# Patient Record
Sex: Female | Born: 1985 | Race: White | Hispanic: No | Marital: Married | Smoking: Never smoker
Health system: Southern US, Community
[De-identification: ages and names within clinical notes are randomized; demographics above are authoritative.]

## PROBLEM LIST (undated history)

## (undated) ENCOUNTER — Ambulatory Visit: Admission: EM | Source: Home / Self Care

## (undated) DIAGNOSIS — K219 Gastro-esophageal reflux disease without esophagitis: Secondary | ICD-10-CM

## (undated) DIAGNOSIS — N939 Abnormal uterine and vaginal bleeding, unspecified: Secondary | ICD-10-CM

## (undated) DIAGNOSIS — F419 Anxiety disorder, unspecified: Secondary | ICD-10-CM

## (undated) DIAGNOSIS — R011 Cardiac murmur, unspecified: Secondary | ICD-10-CM

## (undated) DIAGNOSIS — D649 Anemia, unspecified: Secondary | ICD-10-CM

## (undated) DIAGNOSIS — R7303 Prediabetes: Secondary | ICD-10-CM

## (undated) DIAGNOSIS — J189 Pneumonia, unspecified organism: Secondary | ICD-10-CM

## (undated) DIAGNOSIS — R0600 Dyspnea, unspecified: Secondary | ICD-10-CM

## (undated) DIAGNOSIS — M1990 Unspecified osteoarthritis, unspecified site: Secondary | ICD-10-CM

## (undated) DIAGNOSIS — R569 Unspecified convulsions: Secondary | ICD-10-CM

## (undated) DIAGNOSIS — T7840XA Allergy, unspecified, initial encounter: Secondary | ICD-10-CM

## (undated) DIAGNOSIS — F32A Depression, unspecified: Secondary | ICD-10-CM

## (undated) DIAGNOSIS — F4310 Post-traumatic stress disorder, unspecified: Secondary | ICD-10-CM

## (undated) DIAGNOSIS — G44009 Cluster headache syndrome, unspecified, not intractable: Secondary | ICD-10-CM

## (undated) DIAGNOSIS — J45909 Unspecified asthma, uncomplicated: Secondary | ICD-10-CM

## (undated) DIAGNOSIS — G510 Bell's palsy: Secondary | ICD-10-CM

## (undated) DIAGNOSIS — C801 Malignant (primary) neoplasm, unspecified: Secondary | ICD-10-CM

## (undated) HISTORY — DX: Depression, unspecified: F32.A

## (undated) HISTORY — DX: Cardiac murmur, unspecified: R01.1

## (undated) HISTORY — DX: Unspecified osteoarthritis, unspecified site: M19.90

## (undated) HISTORY — DX: Malignant (primary) neoplasm, unspecified: C80.1

## (undated) HISTORY — DX: Allergy, unspecified, initial encounter: T78.40XA

## (undated) HISTORY — PX: TUBAL LIGATION: SHX77

## (undated) HISTORY — PX: OTHER SURGICAL HISTORY: SHX169

---
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
## 2006-01-20 ENCOUNTER — Emergency Department: Payer: Self-pay | Admitting: Emergency Medicine

## 2007-05-15 ENCOUNTER — Ambulatory Visit: Payer: Self-pay | Admitting: Family Medicine

## 2007-05-16 ENCOUNTER — Emergency Department: Payer: Self-pay | Admitting: Unknown Physician Specialty

## 2007-07-15 ENCOUNTER — Emergency Department: Payer: Self-pay | Admitting: Emergency Medicine

## 2007-07-26 ENCOUNTER — Emergency Department: Payer: Self-pay | Admitting: Emergency Medicine

## 2007-09-10 ENCOUNTER — Other Ambulatory Visit: Payer: Self-pay

## 2007-09-10 ENCOUNTER — Emergency Department: Payer: Self-pay | Admitting: Emergency Medicine

## 2007-10-04 ENCOUNTER — Emergency Department: Payer: Self-pay | Admitting: Emergency Medicine

## 2007-12-29 ENCOUNTER — Emergency Department: Payer: Self-pay | Admitting: Emergency Medicine

## 2008-03-17 ENCOUNTER — Observation Stay: Payer: Self-pay | Admitting: Obstetrics and Gynecology

## 2008-04-07 ENCOUNTER — Observation Stay: Payer: Self-pay

## 2008-05-21 ENCOUNTER — Observation Stay: Payer: Self-pay

## 2008-05-26 ENCOUNTER — Observation Stay: Payer: Self-pay

## 2008-06-01 ENCOUNTER — Observation Stay: Payer: Self-pay

## 2008-06-15 ENCOUNTER — Observation Stay: Payer: Self-pay

## 2008-06-21 ENCOUNTER — Inpatient Hospital Stay: Payer: Self-pay

## 2009-04-01 IMAGING — CT CT HEAD WITHOUT CONTRAST
2 series · 16 of 30 positions shown, 20 images · non-contrast
Comparison: none

REASON FOR EXAM: Head Trauma + LOC
COMMENTS:   LMP: > one month ago

[Series 2: without · axial · non-contrast · 0.39mm/px · z∈[+762,+887]mm · 13 of 31 slices shown, 17 images]
[im 3/31  brain]
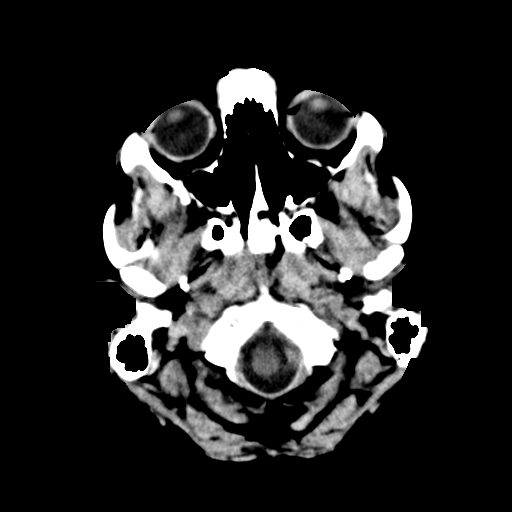
[im 3/31  bone]
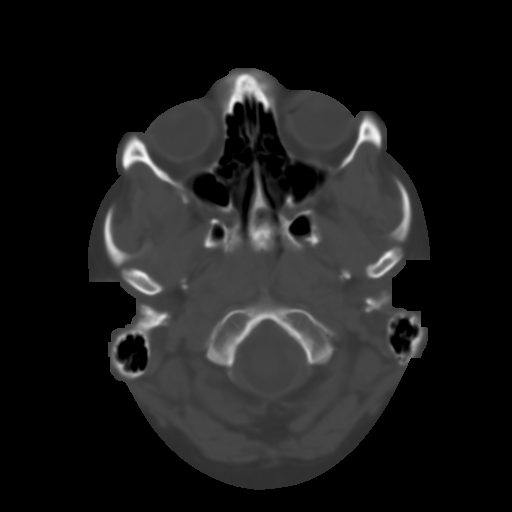
[im 5/31  brain]
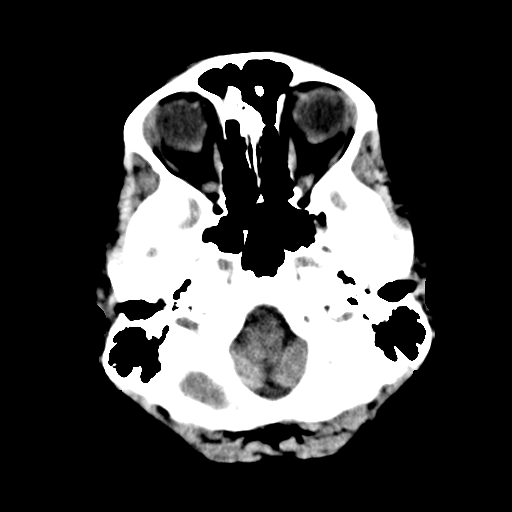
[im 7/31  brain]
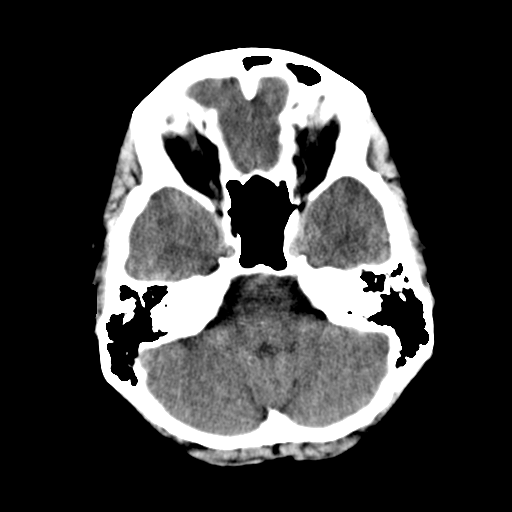
[im 9/31  brain]
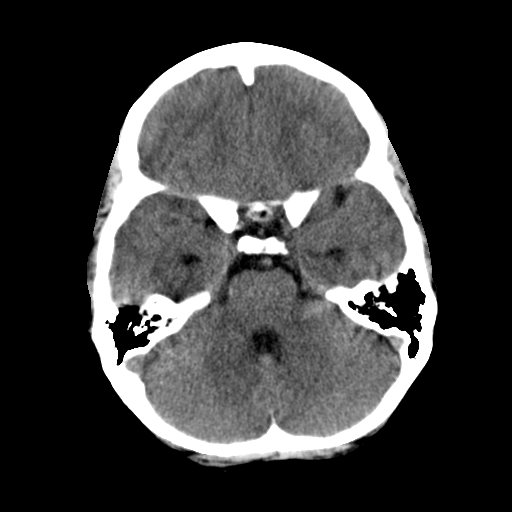
[im 11/31  brain]
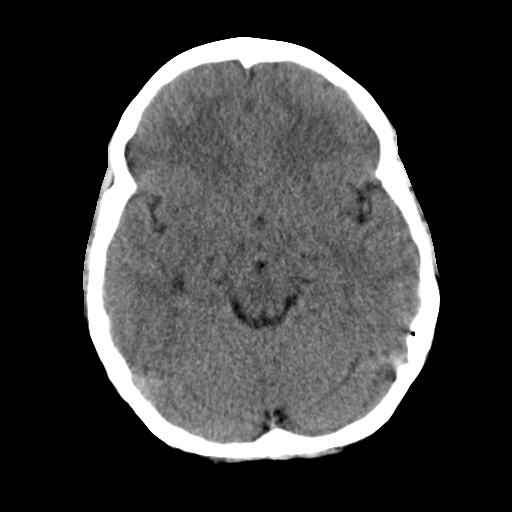
[im 11/31  bone]
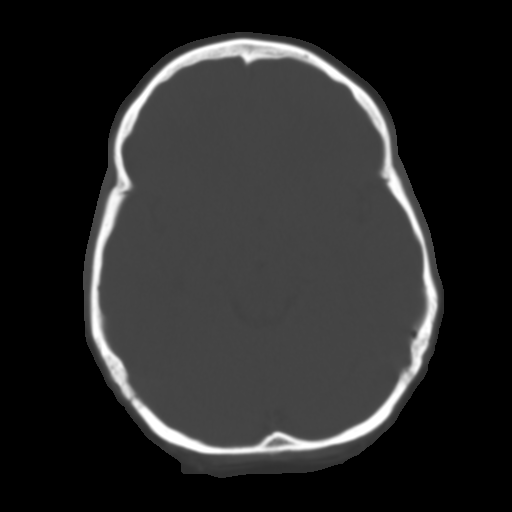
[im 13/31  brain]
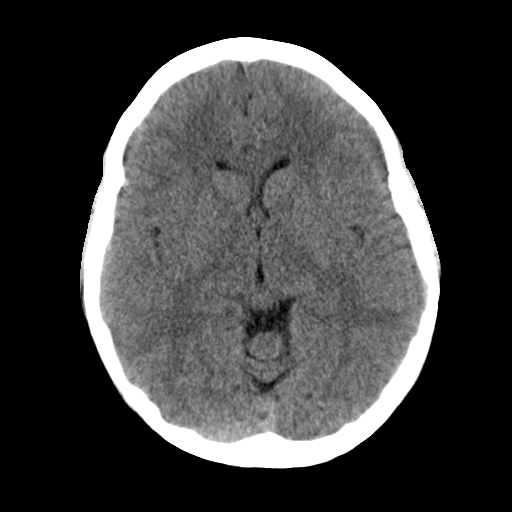
[im 16/31  brain]
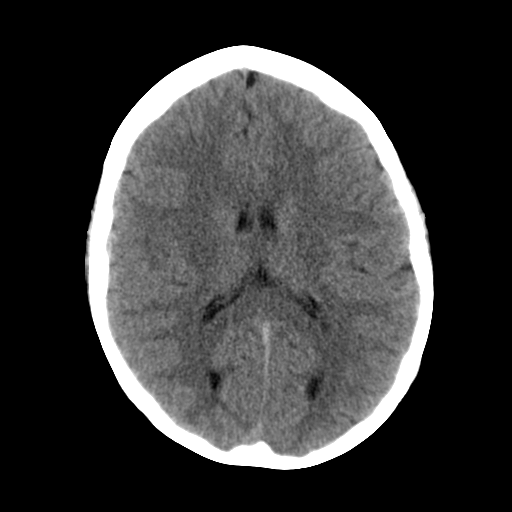
[im 18/31  brain]
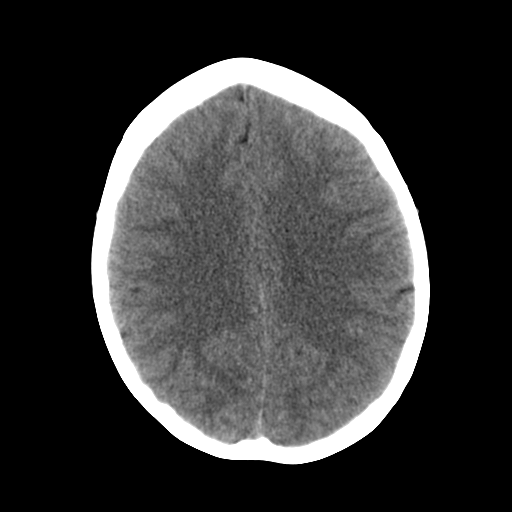
[im 20/31  brain]
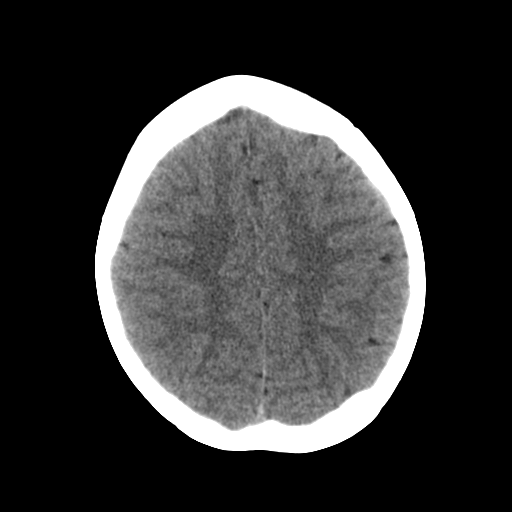
[im 20/31  bone]
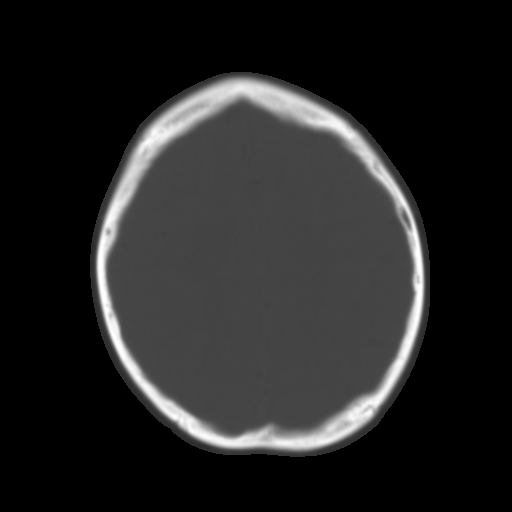
[im 22/31  brain]
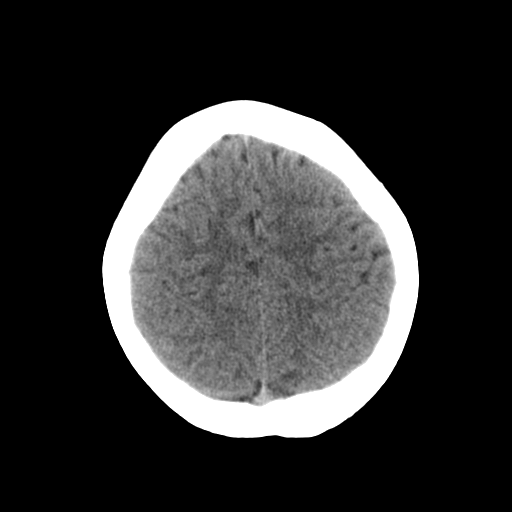
[im 24/31  brain]
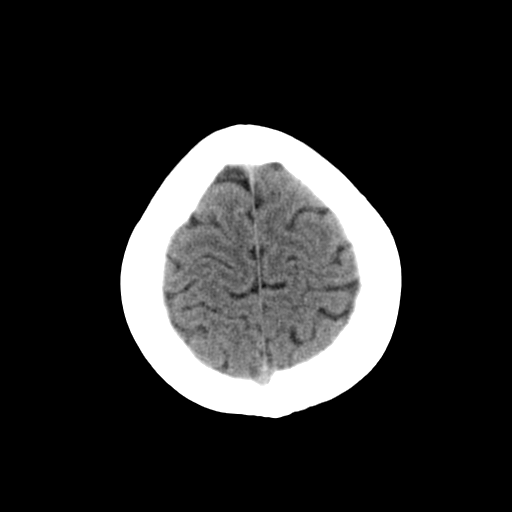
[im 26/31  brain]
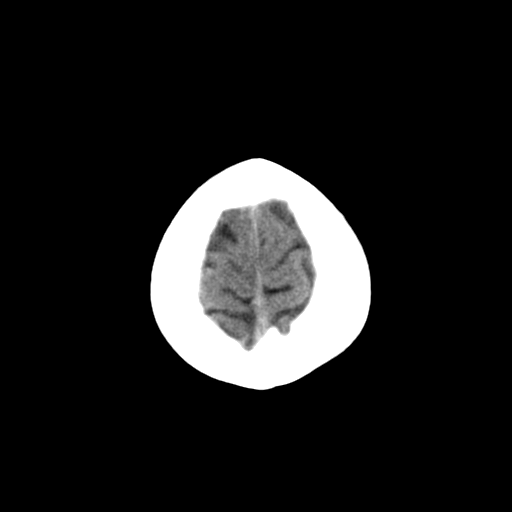
[im 28/31  brain]
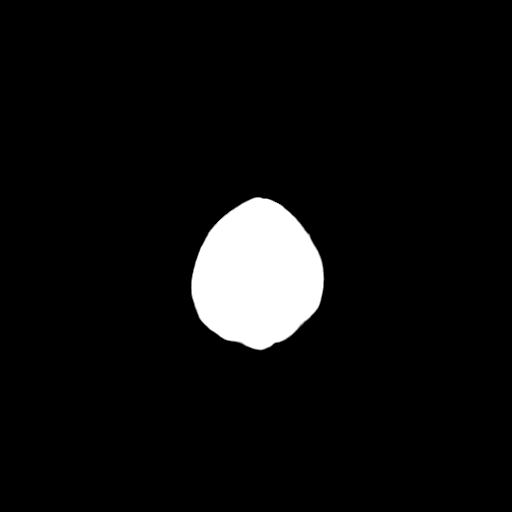
[im 28/31  bone]
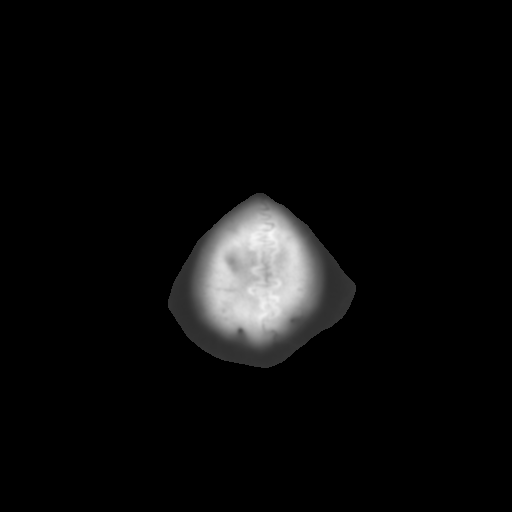

[Series 3: bone · axial · 0.39mm/px · z∈[+762,+802]mm · 3 of 31 slices shown]
[im 3/31  bone]
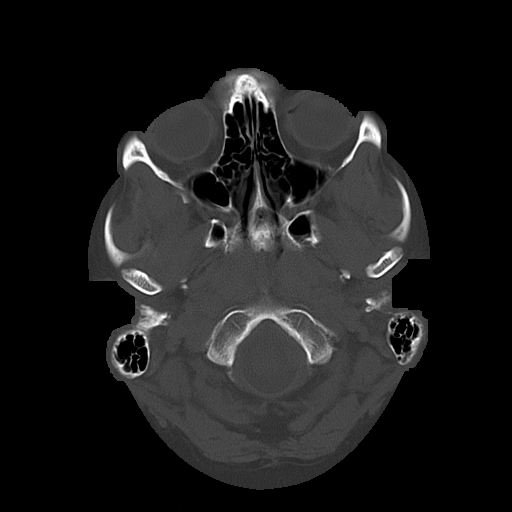
[im 7/31  bone]
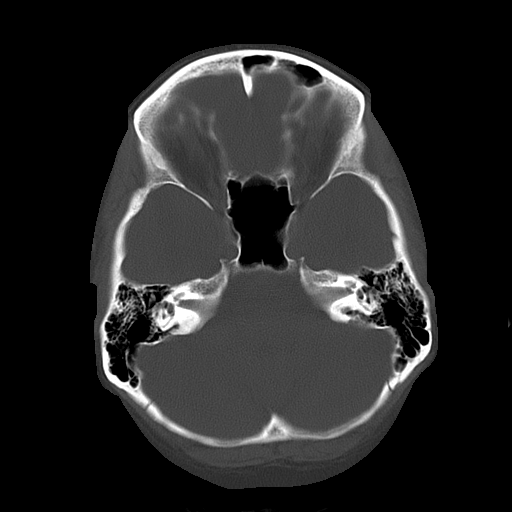
[im 11/31  bone]
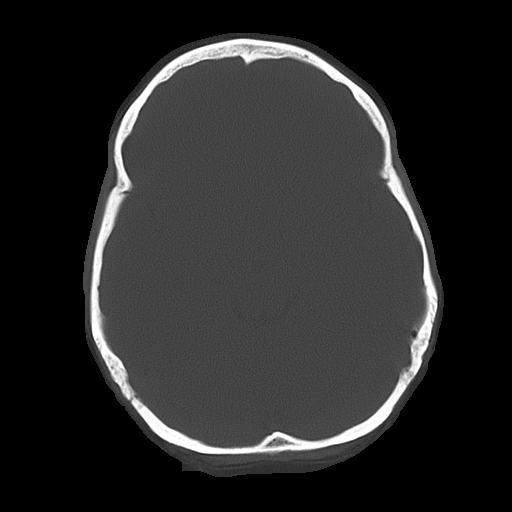

[16 of 30 positions shown; findings below may reference images not displayed]

PROCEDURE:     CT  - CT HEAD WITHOUT CONTRAST  - July 15, 2007  [DATE]

RESULT:     The ventricles are normal in size and position. There is no
intracranial hemorrhage, mass, or mass-effect. The cerebellum and brainstem
are normal in density. At bone window settings, I do not see evidence of an
acute skull fracture. The observed portions of the paranasal sinuses are
clear. The mastoid air cells are well pneumatized.
IMPRESSION: I do not see evidence of acute intracranial abnormality.

This report was called to the [HOSPITAL] the conclusion of the
study.

## 2009-04-01 IMAGING — CR DG CHEST 2V
1 series · 2 of 2 positions shown · non-contrast
Comparison: none

REASON FOR EXAM: Chest Trauma
COMMENTS:   LMP: > one month ago

[Series 1: view not recorded · 0.17mm/px · 2 of 2 slices shown]
[im 1/2]
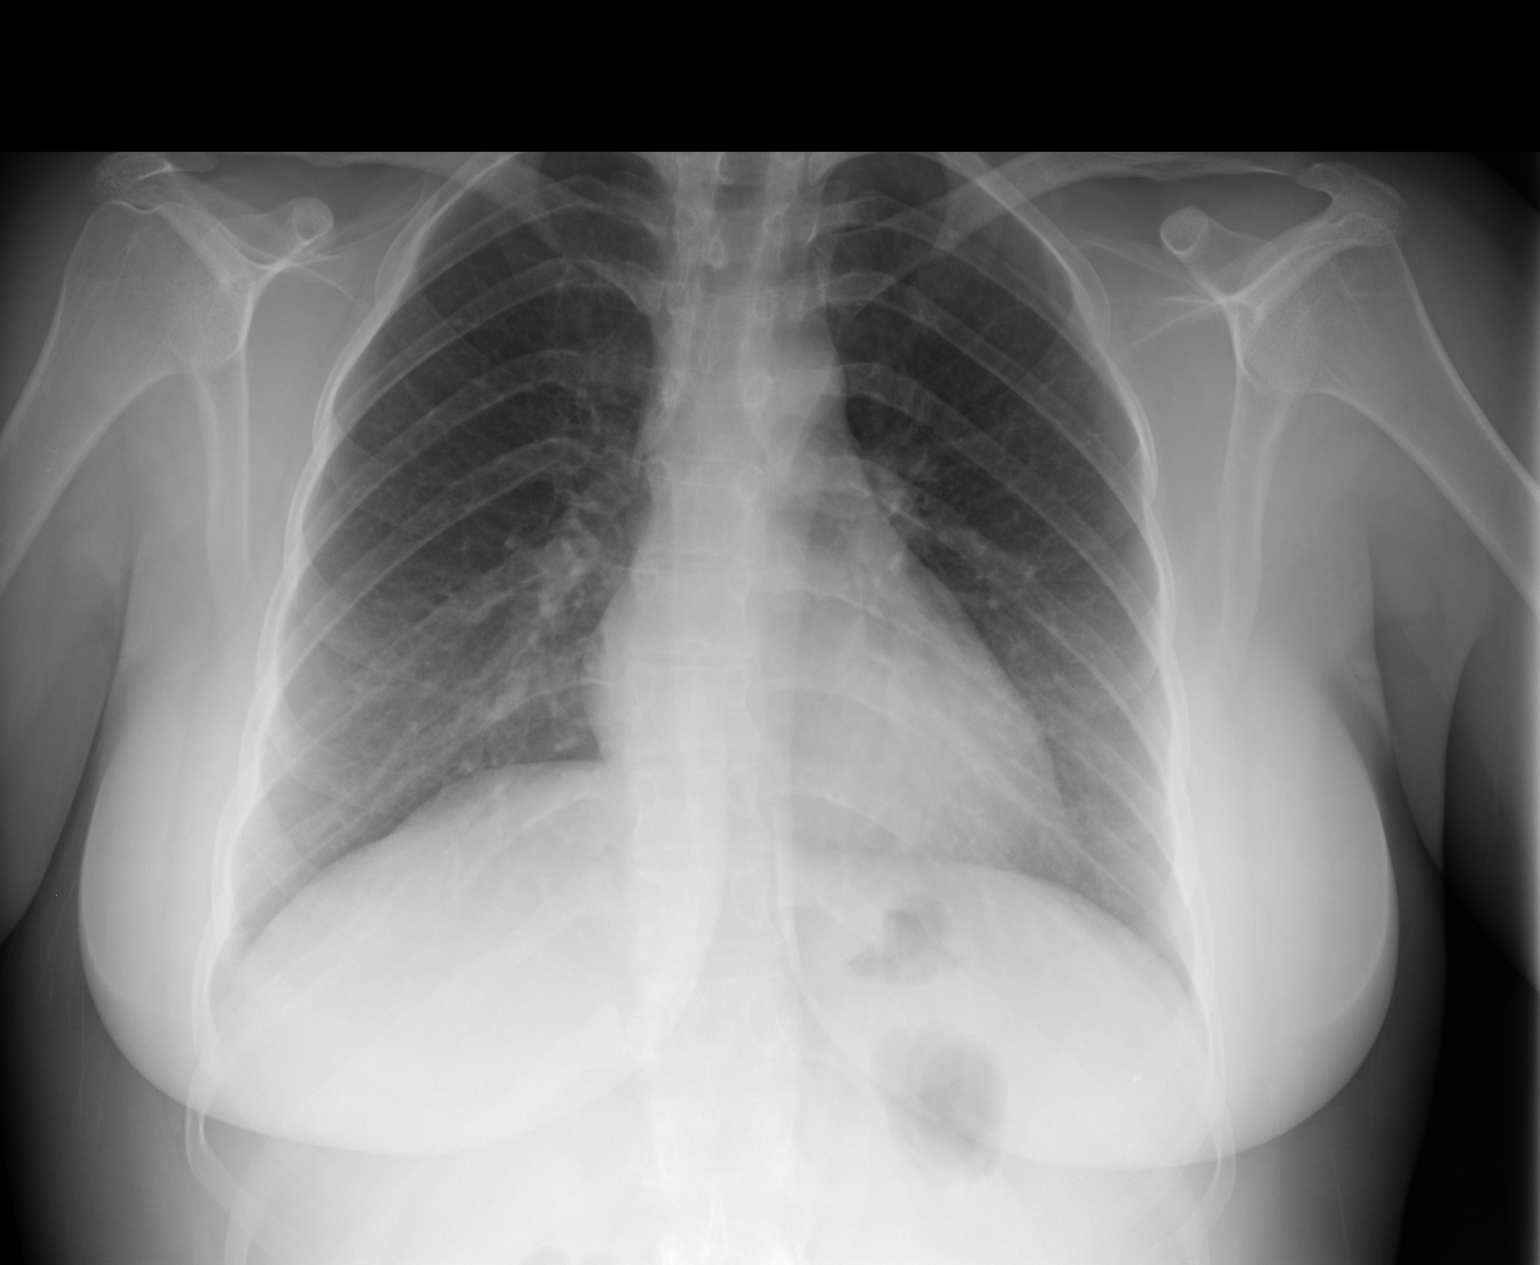
[im 2/2]
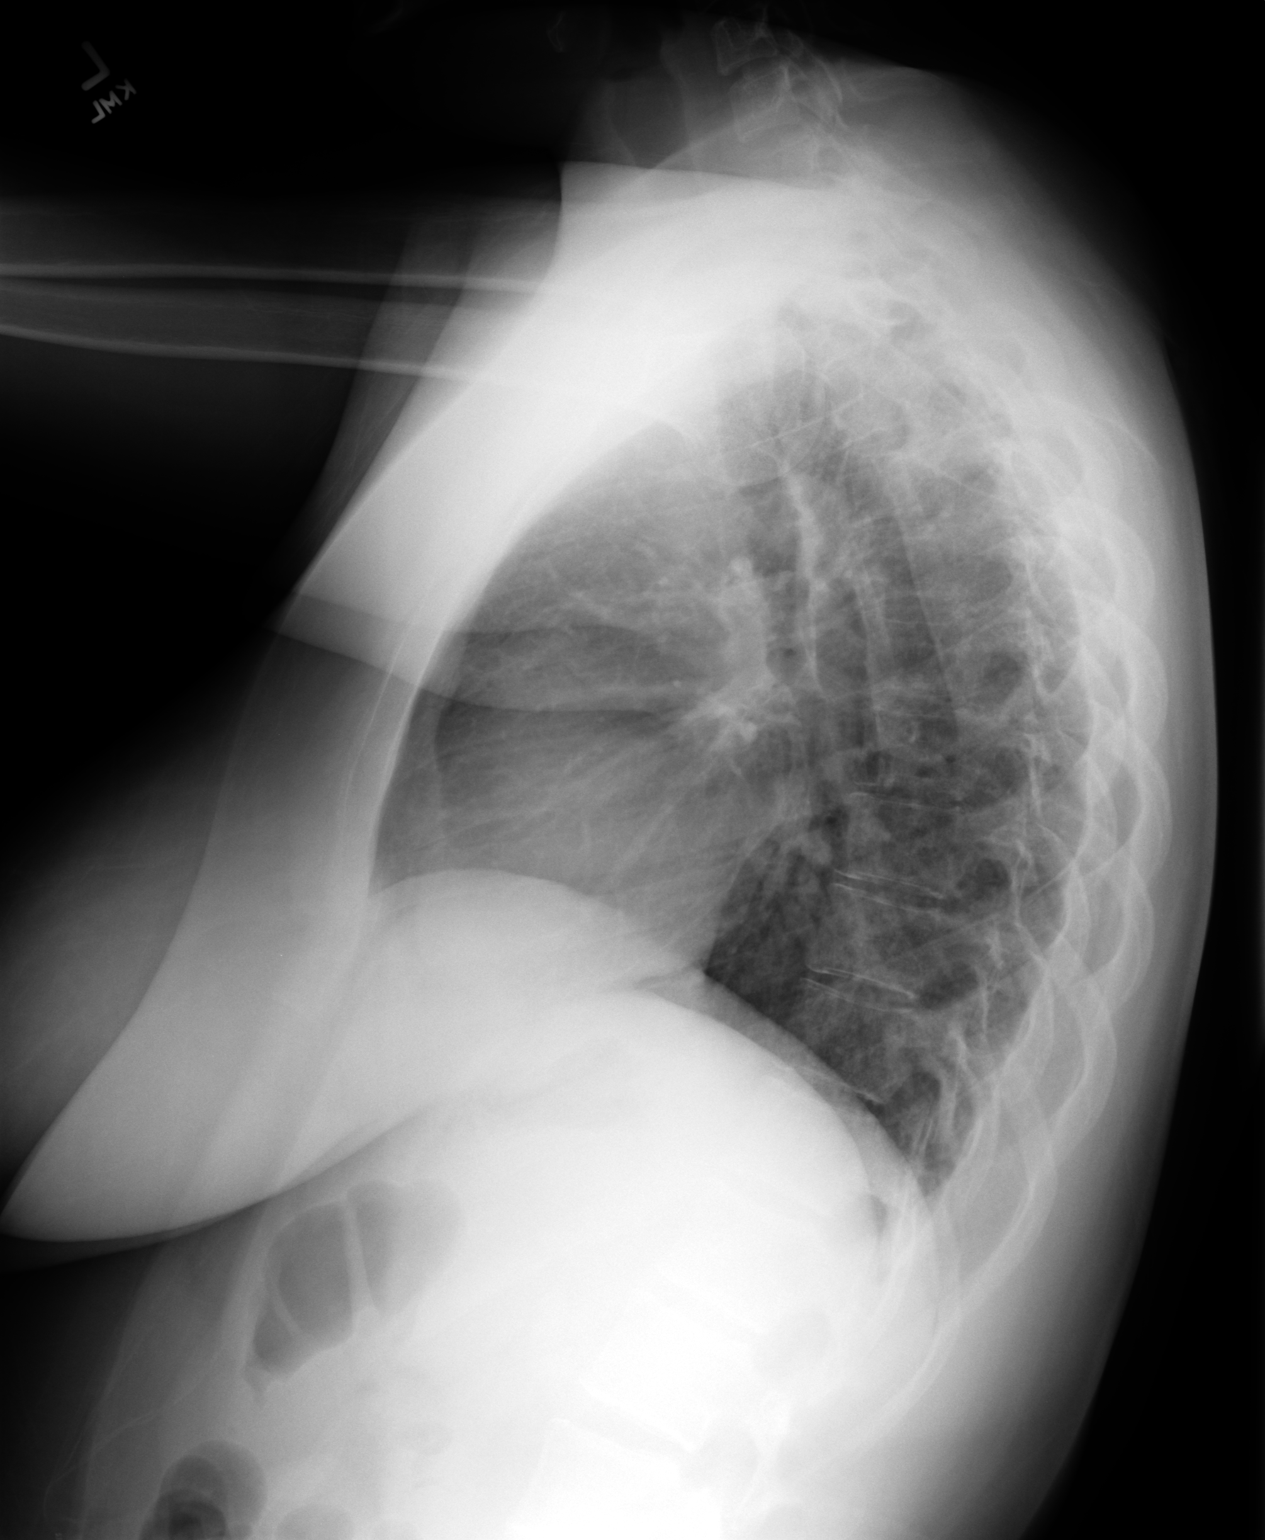

[2 of 2 positions shown; findings below may reference images not displayed]

PROCEDURE:     DXR - DXR CHEST PA (OR AP) AND LATERAL  - July 15, 2007  [DATE]

RESULT:     There is no prior exam for comparison.

The lungs are clear. The heart and pulmonary vessels are normal. The bony
and mediastinal structures are unremarkable. There is no effusion. There is
no pneumothorax or evidence of congestive failure.
IMPRESSION: No acute cardiopulmonary disease.

## 2009-07-06 ENCOUNTER — Ambulatory Visit: Payer: Self-pay | Admitting: Obstetrics and Gynecology

## 2009-07-08 ENCOUNTER — Ambulatory Visit: Payer: Self-pay | Admitting: Obstetrics and Gynecology

## 2009-07-20 ENCOUNTER — Emergency Department: Payer: Self-pay | Admitting: Emergency Medicine

## 2011-03-08 ENCOUNTER — Other Ambulatory Visit: Payer: Self-pay

## 2011-10-19 ENCOUNTER — Emergency Department: Payer: Self-pay | Admitting: *Deleted

## 2011-10-19 LAB — CBC
MCH: 30.5 pg (ref 26.0–34.0)
RBC: 4.56 10*6/uL (ref 3.80–5.20)
RDW: 13.1 % (ref 11.5–14.5)
WBC: 4.4 10*3/uL (ref 3.6–11.0)

## 2011-10-19 LAB — HCG, QUANTITATIVE, PREGNANCY: Beta Hcg, Quant.: <1 m[IU]/mL — ABNORMAL LOW

## 2011-12-24 ENCOUNTER — Emergency Department: Payer: Self-pay | Admitting: Emergency Medicine

## 2011-12-24 LAB — URINALYSIS, COMPLETE
Bacteria: NONE SEEN
Bilirubin,UR: NEGATIVE
Blood: NEGATIVE
Glucose,UR: NEGATIVE mg/dL (ref 0–75)
Ketone: NEGATIVE
Leukocyte Esterase: NEGATIVE
Nitrite: NEGATIVE
Ph: 7 (ref 4.5–8.0)
Protein: NEGATIVE
RBC,UR: <1 /HPF (ref 0–5)
Specific Gravity: 1.023 (ref 1.003–1.030)
Squamous Epithelial: 7
WBC UR: 1 /HPF (ref 0–5)

## 2011-12-24 LAB — PREGNANCY, URINE: Pregnancy Test, Urine: NEGATIVE m[IU]/mL

## 2011-12-25 LAB — BASIC METABOLIC PANEL
Anion Gap: 7 (ref 7–16)
BUN: 12 mg/dL (ref 7–18)
Calcium, Total: 9.3 mg/dL (ref 8.5–10.1)
Chloride: 107 mmol/L (ref 98–107)
EGFR (African American): >60
EGFR (Non-African Amer.): >60
Glucose: 79 mg/dL (ref 65–99)
Osmolality: 282 (ref 275–301)
Sodium: 142 mmol/L (ref 136–145)

## 2011-12-25 LAB — CBC
HCT: 41.8 % (ref 35.0–47.0)
MCHC: 33.9 g/dL (ref 32.0–36.0)
MCV: 91 fL (ref 80–100)
Platelet: 170 10*3/uL (ref 150–440)
RBC: 4.59 10*6/uL (ref 3.80–5.20)
WBC: 8.5 10*3/uL (ref 3.6–11.0)

## 2011-12-25 LAB — WET PREP, GENITAL

## 2012-01-28 ENCOUNTER — Emergency Department: Payer: Self-pay | Admitting: Emergency Medicine

## 2012-03-31 ENCOUNTER — Emergency Department: Payer: Self-pay

## 2012-03-31 LAB — CBC
HGB: 14.3 g/dL (ref 12.0–16.0)
MCH: 30.9 pg (ref 26.0–34.0)
MCHC: 34.4 g/dL (ref 32.0–36.0)
MCV: 90 fL (ref 80–100)
Platelet: 189 10*3/uL (ref 150–440)
RDW: 12.7 % (ref 11.5–14.5)
WBC: 7.7 10*3/uL (ref 3.6–11.0)

## 2012-03-31 LAB — COMPREHENSIVE METABOLIC PANEL
Albumin: 3.8 g/dL (ref 3.4–5.0)
Anion Gap: 9 (ref 7–16)
BUN: 12 mg/dL (ref 7–18)
Bilirubin,Total: 0.7 mg/dL (ref 0.2–1.0)
Chloride: 105 mmol/L (ref 98–107)
Creatinine: 0.68 mg/dL (ref 0.60–1.30)
Glucose: 93 mg/dL (ref 65–99)
Potassium: 3.8 mmol/L (ref 3.5–5.1)
SGOT(AST): 20 U/L (ref 15–37)
SGPT (ALT): 23 U/L (ref 12–78)
Total Protein: 7.9 g/dL (ref 6.4–8.2)

## 2012-03-31 LAB — URINALYSIS, COMPLETE
Bilirubin,UR: NEGATIVE
Blood: NEGATIVE
Glucose,UR: NEGATIVE mg/dL (ref 0–75)
Leukocyte Esterase: NEGATIVE
Protein: NEGATIVE
RBC,UR: 1 /HPF (ref 0–5)
Squamous Epithelial: 7
WBC UR: 1 /HPF (ref 0–5)

## 2012-03-31 LAB — PREGNANCY, URINE: Pregnancy Test, Urine: NEGATIVE m[IU]/mL

## 2012-09-14 ENCOUNTER — Emergency Department: Payer: Self-pay | Admitting: Unknown Physician Specialty

## 2013-03-14 ENCOUNTER — Emergency Department: Payer: Self-pay | Admitting: Emergency Medicine

## 2013-10-20 ENCOUNTER — Emergency Department: Payer: Self-pay | Admitting: Emergency Medicine

## 2013-10-20 LAB — COMPREHENSIVE METABOLIC PANEL
ALBUMIN: 4 g/dL (ref 3.4–5.0)
ALK PHOS: 81 U/L
AST: 20 U/L (ref 15–37)
Anion Gap: 8 (ref 7–16)
BUN: 12 mg/dL (ref 7–18)
Bilirubin,Total: 1.1 mg/dL — ABNORMAL HIGH (ref 0.2–1.0)
CHLORIDE: 109 mmol/L — AB (ref 98–107)
CO2: 20 mmol/L — AB (ref 21–32)
Calcium, Total: 9.5 mg/dL (ref 8.5–10.1)
Creatinine: 0.61 mg/dL (ref 0.60–1.30)
Glucose: 107 mg/dL — ABNORMAL HIGH (ref 65–99)
Osmolality: 274 (ref 275–301)
Potassium: 3.4 mmol/L — ABNORMAL LOW (ref 3.5–5.1)
SGPT (ALT): 25 U/L (ref 12–78)
SODIUM: 137 mmol/L (ref 136–145)
TOTAL PROTEIN: 8.4 g/dL — AB (ref 6.4–8.2)

## 2013-10-20 LAB — CBC WITH DIFFERENTIAL/PLATELET
Basophil #: 0.1 10*3/uL (ref 0.0–0.1)
Basophil %: 0.8 %
EOS PCT: 0.4 %
Eosinophil #: 0 10*3/uL (ref 0.0–0.7)
HCT: 45.1 % (ref 35.0–47.0)
HGB: 15.1 g/dL (ref 12.0–16.0)
LYMPHS PCT: 28.3 %
Lymphocyte #: 2.4 10*3/uL (ref 1.0–3.6)
MCH: 30.2 pg (ref 26.0–34.0)
MCHC: 33.6 g/dL (ref 32.0–36.0)
MCV: 90 fL (ref 80–100)
Monocyte #: 0.6 x10 3/mm (ref 0.2–0.9)
Monocyte %: 6.7 %
Neutrophil #: 5.4 10*3/uL (ref 1.4–6.5)
Neutrophil %: 63.8 %
Platelet: 214 10*3/uL (ref 150–440)
RBC: 5.02 10*6/uL (ref 3.80–5.20)
RDW: 12.7 % (ref 11.5–14.5)
WBC: 8.5 10*3/uL (ref 3.6–11.0)

## 2013-10-20 LAB — TROPONIN I

## 2013-11-01 ENCOUNTER — Emergency Department: Payer: Self-pay | Admitting: Emergency Medicine

## 2013-11-01 LAB — URINALYSIS, COMPLETE
Bacteria: NONE SEEN
Bilirubin,UR: NEGATIVE
Glucose,UR: NEGATIVE mg/dL (ref 0–75)
Ketone: NEGATIVE
NITRITE: NEGATIVE
Ph: 7 (ref 4.5–8.0)
Protein: NEGATIVE
SPECIFIC GRAVITY: 1.012 (ref 1.003–1.030)
Squamous Epithelial: 2

## 2013-11-01 LAB — BASIC METABOLIC PANEL
ANION GAP: 9 (ref 7–16)
BUN: 9 mg/dL (ref 7–18)
Calcium, Total: 8.7 mg/dL (ref 8.5–10.1)
Chloride: 108 mmol/L — ABNORMAL HIGH (ref 98–107)
Co2: 23 mmol/L (ref 21–32)
Creatinine: 0.49 mg/dL — ABNORMAL LOW (ref 0.60–1.30)
EGFR (African American): >60
EGFR (Non-African Amer.): >60
Glucose: 93 mg/dL (ref 65–99)
OSMOLALITY: 278 (ref 275–301)
Potassium: 3.8 mmol/L (ref 3.5–5.1)
Sodium: 140 mmol/L (ref 136–145)

## 2013-11-01 LAB — CBC
HCT: 40.5 % (ref 35.0–47.0)
HGB: 13.3 g/dL (ref 12.0–16.0)
MCH: 29.8 pg (ref 26.0–34.0)
MCHC: 32.8 g/dL (ref 32.0–36.0)
MCV: 91 fL (ref 80–100)
Platelet: 167 10*3/uL (ref 150–440)
RBC: 4.47 10*6/uL (ref 3.80–5.20)
RDW: 13 % (ref 11.5–14.5)
WBC: 7.8 10*3/uL (ref 3.6–11.0)

## 2013-11-01 LAB — DRUG SCREEN, URINE
Amphetamines, Ur Screen: NEGATIVE (ref ?–1000)
Barbiturates, Ur Screen: NEGATIVE (ref ?–200)
Benzodiazepine, Ur Scrn: NEGATIVE (ref ?–200)
CANNABINOID 50 NG, UR ~~LOC~~: POSITIVE (ref ?–50)
Cocaine Metabolite,Ur ~~LOC~~: NEGATIVE (ref ?–300)
MDMA (Ecstasy)Ur Screen: NEGATIVE (ref ?–500)
Methadone, Ur Screen: NEGATIVE (ref ?–300)
OPIATE, UR SCREEN: NEGATIVE (ref ?–300)
Phencyclidine (PCP) Ur S: NEGATIVE (ref ?–25)
Tricyclic, Ur Screen: NEGATIVE (ref ?–1000)

## 2013-11-01 LAB — PREGNANCY, URINE: PREGNANCY TEST, URINE: NEGATIVE m[IU]/mL

## 2013-11-02 LAB — URINE CULTURE

## 2014-01-13 ENCOUNTER — Emergency Department: Payer: Self-pay | Admitting: Emergency Medicine

## 2014-04-12 ENCOUNTER — Emergency Department: Payer: Self-pay | Admitting: Emergency Medicine

## 2014-12-15 ENCOUNTER — Encounter: Payer: Self-pay | Admitting: Emergency Medicine

## 2014-12-15 ENCOUNTER — Emergency Department
Admission: EM | Admit: 2014-12-15 | Discharge: 2014-12-15 | Disposition: A | Discharge status: Home / Self Care | Payer: Self-pay | Attending: Emergency Medicine | Admitting: Emergency Medicine

## 2014-12-15 ENCOUNTER — Other Ambulatory Visit: Payer: Self-pay

## 2014-12-15 DIAGNOSIS — G510 Bell's palsy: Secondary | ICD-10-CM | POA: Insufficient documentation

## 2014-12-15 DIAGNOSIS — G4489 Other headache syndrome: Secondary | ICD-10-CM | POA: Insufficient documentation

## 2014-12-15 DIAGNOSIS — Z87891 Personal history of nicotine dependence: Secondary | ICD-10-CM | POA: Insufficient documentation

## 2014-12-15 HISTORY — DX: Bell's palsy: G51.0

## 2014-12-15 HISTORY — DX: Anxiety disorder, unspecified: F41.9

## 2014-12-15 HISTORY — DX: Unspecified convulsions: R56.9

## 2014-12-15 HISTORY — DX: Cluster headache syndrome, unspecified, not intractable: G44.009

## 2014-12-15 MED ORDER — SODIUM CHLORIDE 0.9 % IV BOLUS (SEPSIS)
1000.0000 mL | Freq: Once | INTRAVENOUS | Status: AC
  Administered 2014-12-15: 1000 mL via INTRAVENOUS

## 2014-12-15 MED ORDER — PROCHLORPERAZINE EDISYLATE 5 MG/ML IJ SOLN
INTRAMUSCULAR | Status: AC
  Administered 2014-12-15: 10 mg via INTRAVENOUS
  Filled 2014-12-15: qty 2

## 2014-12-15 MED ORDER — PROCHLORPERAZINE MALEATE 10 MG PO TABS: 10.0000 mg | ORAL_TABLET | Freq: Four times a day (QID) | ORAL | Status: DC | PRN

## 2014-12-15 MED ORDER — PROCHLORPERAZINE EDISYLATE 5 MG/ML IJ SOLN
10.0000 mg | Freq: Four times a day (QID) | INTRAMUSCULAR | Status: DC | PRN
  Administered 2014-12-15: 10 mg via INTRAVENOUS
  Filled 2014-12-15: qty 2

## 2014-12-15 NOTE — ED Notes (Signed)
Pt reports history of cluster headaches. Pt states pain to left temple for past couple of days. Blurry vision to left eye for one month. Pt also reports history of Anxiety and has not had the money to get her medications refilled. Pt states she feels like she is anxious also.

## 2014-12-15 NOTE — Discharge Instructions (Signed)
Please seek medical attention for any high fevers, chest pain, shortness of breath, change in behavior, persistent vomiting, bloody stool or any other new or concerning symptoms. ° °Headaches, Frequently Asked Questions °MIGRAINE HEADACHES °Q: What is migraine? What causes it? How can I treat it? °A: Generally, migraine headaches begin as a dull ache. Then they develop into a constant, throbbing, and pulsating pain. You may experience pain at the temples. You may experience pain at the front or back of one or both sides of the head. The pain is usually accompanied by a combination of: °· Nausea. °· Vomiting. °· Sensitivity to light and noise. °Some people (about 15%) experience an aura (see below) before an attack. The cause of migraine is believed to be chemical reactions in the brain. Treatment for migraine may include over-the-counter or prescription medications. It may also include self-help techniques. These include relaxation training and biofeedback.  °Q: What is an aura? °A: About 15% of people with migraine get an "aura". This is a sign of neurological symptoms that occur before a migraine headache. You may see wavy or jagged lines, dots, or flashing lights. You might experience tunnel vision or blind spots in one or both eyes. The aura can include visual or auditory hallucinations (something imagined). It may include disruptions in smell (such as strange odors), taste or touch. Other symptoms include: °· Numbness. °· A "pins and needles" sensation. °· Difficulty in recalling or speaking the correct word. °These neurological events may last as long as 60 minutes. These symptoms will fade as the headache begins. °Q: What is a trigger? °A: Certain physical or environmental factors can lead to or "trigger" a migraine. These include: °· Foods. °· Hormonal changes. °· Weather. °· Stress. °It is important to remember that triggers are different for everyone. To help prevent migraine attacks, you need to figure  out which triggers affect you. Keep a headache diary. This is a good way to track triggers. The diary will help you talk to your healthcare professional about your condition. °Q: Does weather affect migraines? °A: Bright sunshine, hot, humid conditions, and drastic changes in barometric pressure may lead to, or "trigger," a migraine attack in some people. But studies have shown that weather does not act as a trigger for everyone with migraines. °Q: What is the link between migraine and hormones? °A: Hormones start and regulate many of your body's functions. Hormones keep your body in balance within a constantly changing environment. The levels of hormones in your body are unbalanced at times. Examples are during menstruation, pregnancy, or menopause. That can lead to a migraine attack. In fact, about three quarters of all women with migraine report that their attacks are related to the menstrual cycle.  °Q: Is there an increased risk of stroke for migraine sufferers? °A: The likelihood of a migraine attack causing a stroke is very remote. That is not to say that migraine sufferers cannot have a stroke associated with their migraines. In persons under age 40, the most common associated factor for stroke is migraine headache. But over the course of a person's normal life span, the occurrence of migraine headache may actually be associated with a reduced risk of dying from cerebrovascular disease due to stroke.  °Q: What are acute medications for migraine? °A: Acute medications are used to treat the pain of the headache after it has started. Examples over-the-counter medications, NSAIDs, ergots, and triptans.  °Q: What are the triptans? °A: Triptans are the newest class of abortive medications.   They are specifically targeted to treat migraine. Triptans are vasoconstrictors. They moderate some chemical reactions in the brain. The triptans work on receptors in your brain. Triptans help to restore the balance of a  neurotransmitter called serotonin. Fluctuations in levels of serotonin are thought to be a main cause of migraine.  °Q: Are over-the-counter medications for migraine effective? °A: Over-the-counter, or "OTC," medications may be effective in relieving mild to moderate pain and associated symptoms of migraine. But you should see your caregiver before beginning any treatment regimen for migraine.  °Q: What are preventive medications for migraine? °A: Preventive medications for migraine are sometimes referred to as "prophylactic" treatments. They are used to reduce the frequency, severity, and length of migraine attacks. Examples of preventive medications include antiepileptic medications, antidepressants, beta-blockers, calcium channel blockers, and NSAIDs (nonsteroidal anti-inflammatory drugs). °Q: Why are anticonvulsants used to treat migraine? °A: During the past few years, there has been an increased interest in antiepileptic drugs for the prevention of migraine. They are sometimes referred to as "anticonvulsants". Both epilepsy and migraine may be caused by similar reactions in the brain.  °Q: Why are antidepressants used to treat migraine? °A: Antidepressants are typically used to treat people with depression. They may reduce migraine frequency by regulating chemical levels, such as serotonin, in the brain.  °Q: What alternative therapies are used to treat migraine? °A: The term "alternative therapies" is often used to describe treatments considered outside the scope of conventional Western medicine. Examples of alternative therapy include acupuncture, acupressure, and yoga. Another common alternative treatment is herbal therapy. Some herbs are believed to relieve headache pain. Always discuss alternative therapies with your caregiver before proceeding. Some herbal products contain arsenic and other toxins. °TENSION HEADACHES °Q: What is a tension-type headache? What causes it? How can I treat it? °A:  Tension-type headaches occur randomly. They are often the result of temporary stress, anxiety, fatigue, or anger. Symptoms include soreness in your temples, a tightening band-like sensation around your head (a "vice-like" ache). Symptoms can also include a pulling feeling, pressure sensations, and contracting head and neck muscles. The headache begins in your forehead, temples, or the back of your head and neck. Treatment for tension-type headache may include over-the-counter or prescription medications. Treatment may also include self-help techniques such as relaxation training and biofeedback. °CLUSTER HEADACHES °Q: What is a cluster headache? What causes it? How can I treat it? °A: Cluster headache gets its name because the attacks come in groups. The pain arrives with little, if any, warning. It is usually on one side of the head. A tearing or bloodshot eye and a runny nose on the same side of the headache may also accompany the pain. Cluster headaches are believed to be caused by chemical reactions in the brain. They have been described as the most severe and intense of any headache type. Treatment for cluster headache includes prescription medication and oxygen. °SINUS HEADACHES °Q: What is a sinus headache? What causes it? How can I treat it? °A: When a cavity in the bones of the face and skull (a sinus) becomes inflamed, the inflammation will cause localized pain. This condition is usually the result of an allergic reaction, a tumor, or an infection. If your headache is caused by a sinus blockage, such as an infection, you will probably have a fever. An x-ray will confirm a sinus blockage. Your caregiver's treatment might include antibiotics for the infection, as well as antihistamines or decongestants.  °REBOUND HEADACHES °Q: What is a rebound   headache? What causes it? How can I treat it? °A: A pattern of taking acute headache medications too often can lead to a condition known as "rebound headache." A  pattern of taking too much headache medication includes taking it more than 2 days per week or in excessive amounts. That means more than the label or a caregiver advises. With rebound headaches, your medications not only stop relieving pain, they actually begin to cause headaches. Doctors treat rebound headache by tapering the medication that is being overused. Sometimes your caregiver will gradually substitute a different type of treatment or medication. Stopping may be a challenge. Regularly overusing a medication increases the potential for serious side effects. Consult a caregiver if you regularly use headache medications more than 2 days per week or more than the label advises. °ADDITIONAL QUESTIONS AND ANSWERS °Q: What is biofeedback? °A: Biofeedback is a self-help treatment. Biofeedback uses special equipment to monitor your body's involuntary physical responses. Biofeedback monitors: °· Breathing. °· Pulse. °· Heart rate. °· Temperature. °· Muscle tension. °· Brain activity. °Biofeedback helps you refine and perfect your relaxation exercises. You learn to control the physical responses that are related to stress. Once the technique has been mastered, you do not need the equipment any more. °Q: Are headaches hereditary? °A: Four out of five (80%) of people that suffer report a family history of migraine. Scientists are not sure if this is genetic or a family predisposition. Despite the uncertainty, a child has a 50% chance of having migraine if one parent suffers. The child has a 75% chance if both parents suffer.  °Q: Can children get headaches? °A: By the time they reach high school, most young people have experienced some type of headache. Many safe and effective approaches or medications can prevent a headache from occurring or stop it after it has begun.  °Q: What type of doctor should I see to diagnose and treat my headache? °A: Start with your primary caregiver. Discuss his or her experience and  approach to headaches. Discuss methods of classification, diagnosis, and treatment. Your caregiver may decide to recommend you to a headache specialist, depending upon your symptoms or other physical conditions. Having diabetes, allergies, etc., may require a more comprehensive and inclusive approach to your headache. The [employer] will provide, upon request, a list of NHF physician members in your state. °Document Released: 09/08/2003 Document Revised: 09/10/2011 Document Reviewed: 02/16/2008 °Exit[employer]® Patient Information ©2015 [employer]. This information is not intended to replace advice given to you by your health care provider. Make sure you discuss any questions you have with your health care provider. ° °

## 2014-12-15 NOTE — ED Provider Notes (Signed)
[facility] Emergency Department Provider Note    ____________________________________________  Time seen: 1530  I have reviewed the triage vital signs and the nursing notes.   HISTORY  Chief Complaint Migraine   History limited by: Not Limited   HPI Deborah Lambert is a 29 y.o. female who presents because of headache. This is been going on for 1 week. It has been constant. She describes it as severe. It is located in the left temple. She states she has a history of similar headaches and is been diagnosed with cluster headaches. Last set of headaches one month ago. Patient has had some nausea. Denies any trauma.     Past Medical History  Diagnosis Date  . Cluster headaches   . Anxiety   . Focal seizures   . Bell's palsy     There are no active problems to display for this patient.   Past Surgical History  Procedure Laterality Date  . Right arm surgery      No current outpatient prescriptions on file.  Allergies Aspirin and Codeine  No family history on file.  Social History History  Substance Use Topics  . Smoking status: Former Games developer  . Smokeless tobacco: Never Used  . Alcohol Use: No    Review of Systems  Constitutional: Negative for fever. Cardiovascular: Negative for chest pain. Respiratory: Negative for shortness of breath. Gastrointestinal: Negative for abdominal pain, vomiting and diarrhea. Genitourinary: Negative for dysuria. Musculoskeletal: Negative for back pain. Skin: Negative for rash. Neurological: Positive for headache   10-point ROS otherwise negative.  ____________________________________________   PHYSICAL EXAM:  VITAL SIGNS: ED Triage Vitals  Enc Vitals Group     BP 12/15/14 1230 126/89 mmHg     Pulse Rate 12/15/14 1230 131     Resp 12/15/14 1230 20     Temp 12/15/14 1230 98.5 F (36.9 C)     Temp Source 12/15/14 1230 Oral     SpO2 12/15/14 1230 99 %     Weight 12/15/14 1230 160 lb  (72.576 kg)     Height 12/15/14 1230 5\' 5"  (1.651 m)     Head Cir --      Peak Flow --      Pain Score 12/15/14 1234 10   Constitutional: Alert and oriented. Appears in mild distress Eyes: Conjunctivae are normal. PERRL. Normal extraocular movements. ENT   Head: Normocephalic and atraumatic.   Nose: No congestion/rhinnorhea.   Mouth/Throat: Mucous membranes are moist.   Neck: No stridor. Hematological/Lymphatic/Immunilogical: No cervical lymphadenopathy. Cardiovascular: Normal rate, regular rhythm.  No murmurs, rubs, or gallops. Respiratory: Normal respiratory effort without tachypnea nor retractions. Breath sounds are clear and equal bilaterally. No wheezes/rales/rhonchi. Gastrointestinal: Soft and nontender. No distention.  Genitourinary: Deferred Musculoskeletal: Normal range of motion in all extremities. No joint effusions.  No lower extremity tenderness nor edema. Neurologic:  Normal speech and language. No gross focal neurologic deficits are appreciated. Speech is normal.  Skin:  Skin is warm, dry and intact. No rash noted. Psychiatric: Mood and affect are normal. Speech and behavior are normal. Patient exhibits appropriate insight and judgment.  ____________________________________________    LABS (pertinent positives/negatives)  None  ____________________________________________   EKG  I, Phineas Semen, attending physician, personally viewed and interpreted this EKG  EKG Time: 1238 Rate: 93 Rhythm: Normal sinus rhythm Axis: Normal Intervals: QTc 425 QRS: Narrow ST changes: No ST elevation    ____________________________________________    RADIOLOGY  None  ____________________________________________   PROCEDURES  Procedure(s)  performed: None  Critical Care performed: No  ____________________________________________   INITIAL IMPRESSION / ASSESSMENT AND PLAN / ED COURSE  Pertinent labs & imaging results that were available  during my care of the patient were reviewed by me and considered in my medical decision making (see chart for details).  Patient presents to the emergency department 1 week worth of headache. No concerning neurological findings on exam. Will plan on fluids, headache medications and reassessment.   ----------------------------------------- 5:43 PM on 12/15/2014 -----------------------------------------  She states she feels improved after medication. States she would like to be discharged home. Will discharge home with prescription for oral compazine.  CLINICAL IMPRESSION(S) / ED DIAGNOSES  Final diagnoses:  Other headache syndrome     Phineas Semen, MD 12/15/14 1743

## 2015-04-20 ENCOUNTER — Ambulatory Visit
Admission: EM | Admit: 2015-04-20 | Discharge: 2015-04-20 | Disposition: A | Discharge status: Home / Self Care | Payer: Self-pay | Attending: Family Medicine | Admitting: Family Medicine

## 2015-04-20 ENCOUNTER — Ambulatory Visit: Payer: Self-pay

## 2015-04-20 DIAGNOSIS — S60221A Contusion of right hand, initial encounter: Secondary | ICD-10-CM

## 2015-04-20 DIAGNOSIS — S60511A Abrasion of right hand, initial encounter: Secondary | ICD-10-CM

## 2015-04-20 MED ORDER — TETANUS-DIPHTH-ACELL PERTUSSIS 5-2.5-18.5 LF-MCG/0.5 IM SUSP
0.5000 mL | Freq: Once | INTRAMUSCULAR | Status: AC
  Administered 2015-04-20: 0.5 mL via INTRAMUSCULAR

## 2015-04-20 MED ORDER — BACITRACIN ZINC 500 UNIT/GM EX OINT: TOPICAL_OINTMENT | Freq: Once | CUTANEOUS | Status: DC

## 2015-04-20 MED ORDER — TRAMADOL HCL 50 MG PO TABS: 50.0000 mg | ORAL_TABLET | Freq: Three times a day (TID) | ORAL | Status: DC | PRN

## 2015-04-20 NOTE — ED Notes (Signed)
Dropped TV on right hand base of right 4th and 5th finger. Avulsion to base of right 4th finger

## 2015-04-20 NOTE — Discharge Instructions (Signed)
Take medication as prescribed. Apply ice and elevate. Keep clean with soap and water, then apply thin layer of topical antibiotic ointment such as neosporin. Wear splint and buddy tape fingers for 2-3 days or as long as pain continues.   Follow up with orthopedic as discussed above as needed for continued pain. Return to Urgent care for new or worsening concerns.   Contusion A contusion is a deep bruise. Contusions are the result of a blunt injury to tissues and muscle fibers under the skin. The injury causes bleeding under the skin. The skin overlying the contusion may turn blue, purple, or yellow. Minor injuries will give you a painless contusion, but more severe contusions may stay painful and swollen for a few weeks.  CAUSES  This condition is usually caused by a blow, trauma, or direct force to an area of the body. SYMPTOMS  Symptoms of this condition include:  Swelling of the injured area.  Pain and tenderness in the injured area.  Discoloration. The area may have redness and then turn blue, purple, or yellow. DIAGNOSIS  This condition is diagnosed based on a physical exam and medical history. An X-ray, CT scan, or MRI may be needed to determine if there are any associated injuries, such as broken bones (fractures). TREATMENT  Specific treatment for this condition depends on what area of the body was injured. In general, the best treatment for a contusion is resting, icing, applying pressure to (compression), and elevating the injured area. This is often called the RICE strategy. Over-the-counter anti-inflammatory medicines may also be recommended for pain control.  HOME CARE INSTRUCTIONS   Rest the injured area.  If directed, apply ice to the injured area:  Put ice in a plastic bag.  Place a towel between your skin and the bag.  Leave the ice on for 20 minutes, 2-3 times per day.  If directed, apply light compression to the injured area using an elastic bandage. Make sure the  bandage is not wrapped too tightly. Remove and reapply the bandage as directed by your health care provider.  If possible, raise (elevate) the injured area above the level of your heart while you are sitting or lying down.  Take over-the-counter and prescription medicines only as told by your health care provider. SEEK MEDICAL CARE IF:  Your symptoms do not improve after several days of treatment.  Your symptoms get worse.  You have difficulty moving the injured area. SEEK IMMEDIATE MEDICAL CARE IF:   You have severe pain.  You have numbness in a hand or foot.  Your hand or foot turns pale or cold.   This information is not intended to replace advice given to you by your health care provider. Make sure you discuss any questions you have with your health care provider.   Document Released: 03/28/2005 Document Revised: 03/09/2015 Document Reviewed: 11/03/2014 Elsevier Interactive Patient Education 2016 Else[employer] Inc.  Abrasion An abrasion is a cut or scrape on the outer surface of your skin. An abrasion does not extend through all of the layers of your skin. It is important to care for your abrasion properly to prevent infection. CAUSES Most abrasions are caused by falling on or gliding across the ground or another surface. When your skin rubs on something, the outer and inner layer of skin rubs off.  SYMPTOMS A cut or scrape is the main symptom of this condition. The scrape may be bleeding, or it may appear red or pink. If there was an associated fall,  there may be an underlying bruise. DIAGNOSIS An abrasion is diagnosed with a physical exam. TREATMENT Treatment for this condition depends on how large and deep the abrasion is. Usually, your abrasion will be cleaned with water and mild soap. This removes any dirt or debris that may be stuck. An antibiotic ointment may be applied to the abrasion to help prevent infection. A bandage (dressing) may be placed on the abrasion to keep it  clean. You may also need a tetanus shot. HOME CARE INSTRUCTIONS Medicines  Take or apply medicines only as directed by your health care provider.  If you were prescribed an antibiotic ointment, finish all of it even if you start to feel better. Wound Care  Clean the wound with mild soap and water 2-3 times per day or as directed by your health care provider. Pat your wound dry with a clean towel. Do not rub it.  There are many different ways to close and cover a wound. Follow instructions from your health care provider about:  Wound care.  Dressing changes and removal.  Check your wound every day for signs of infection. Watch for:  Redness, swelling, or pain.  Fluid, blood, or pus. General Instructions  Keep the dressing dry as directed by your health care provider. Do not take baths, swim, use a hot tub, or do anything that would put your wound underwater until your health care provider approves.  If there is swelling, raise (elevate) the injured area above the level of your heart while you are sitting or lying down.  Keep all follow-up visits as directed by your health care provider. This is important. SEEK MEDICAL CARE IF:  You received a tetanus shot and you have swelling, severe pain, redness, or bleeding at the injection site.  Your pain is not controlled with medicine.  You have increased redness, swelling, or pain at the site of your wound. SEEK IMMEDIATE MEDICAL CARE IF:  You have a red streak going away from your wound.  You have a fever.  You have fluid, blood, or pus coming from your wound.  You notice a bad smell coming from your wound or your dressing.   This information is not intended to replace advice given to you by your health care provider. Make sure you discuss any questions you have with your health care provider.   Document Released: 03/28/2005 Document Revised: 03/09/2015 Document Reviewed: 06/16/2014 Elsevier Interactive Patient Education  2016 Elsevier Inc.  Hand Contusion  A hand contusion is a deep bruise to the hand. Contusions happen when an injury causes bleeding under the skin. Signs of bruising include pain, puffiness (swelling), and discolored skin. The contusion may turn blue, purple, or yellow. HOME CARE  Put ice on the injured area.  Put ice in a plastic bag.  Place a towel between your skin and the bag.  Leave the ice on for 15-20 minutes, 03-04 times a day.  Only take medicines as told by your doctor.  Use an elastic wrap only as told. You may remove the wrap for sleeping, showering, and bathing. Take the wrap off if you lose feeling (have numbness) in your fingers, or they turn blue or cold. Put the wrap on more loosely.  Keep the hand raised (elevated) with pillows.  Avoid using your hand too much if it is painful. GET HELP RIGHT AWAY IF:   You have more redness, puffiness, or pain in your hand.  Your puffiness or pain does not get better with medicine.  You lose feeling in your hand, or you cannot move your fingers.  Your hand turns cold or blue.  You have pain when you move your fingers.  Your hand feels warm.  Your contusion does not get better in 2 days. MAKE SURE YOU:   Understand these instructions.  Will watch this condition.  Will get help right away if you are not doing well or you get worse.   This information is not intended to replace advice given to you by your health care provider. Make sure you discuss any questions you have with your health care provider.   Document Released: 12/05/2007 Document Revised: 07/09/2014 Document Reviewed: 12/10/2011 Elsevier Interactive Patient Education [employer]

## 2015-04-20 NOTE — ED Provider Notes (Signed)
[facility] Emergency Department Provider Note  ____________________________________________  Time seen: Approximately 3:33 PM  I have reviewed the triage vital signs and the nursing notes.   HISTORY  Chief Complaint Laceration   HPI Deborah Lambert is a 29 y.o. female presents for complaints of right hand pain. States prior to arrival she was helping move a television and the television fell over onto her right hand. Denies other pain or injury. Denies head injury or loss of consciousness. States pain and swelling to right hand since injury. Reports unsure of last tetanus immunization. Denies tingling or numbness sensation. States pain with  moving 4th and 5 th fingers. Denies wrist pain or pain above right hand.    Past Medical History  Diagnosis Date  . Cluster headaches   . Anxiety   . Focal seizures (HCC)   . Bell's palsy     There are no active problems to display for this patient.   Past Surgical History  Procedure Laterality Date  . Right arm surgery      Current Outpatient Rx  Name  Route  Sig  Dispense  Refill  .             Allergies Aspirin and Codeine  History reviewed. No pertinent family history.  Social History Social History  Substance Use Topics  . Smoking status: Former Games developer  . Smokeless tobacco: Never Used  . Alcohol Use: No    Review of Systems Constitutional: No fever/chills Eyes: No visual changes. ENT: No sore throat. Cardiovascular: Denies chest pain. Respiratory: Denies shortness of breath. Gastrointestinal: No abdominal pain.  No nausea, no vomiting.  No diarrhea.  No constipation. Genitourinary: Negative for dysuria. Musculoskeletal: Negative for back pain. Right hand pain as above.  Skin: Negative for rash.abrasion to right hand Neurological: Negative for headaches, focal weakness or numbness.  10-point ROS otherwise negative.  ____________________________________________   PHYSICAL  EXAM:  VITAL SIGNS: ED Triage Vitals  Enc Vitals Group     BP 04/20/15 1505 115/83 mmHg     Pulse Rate 04/20/15 1505 91     Resp 04/20/15 1505 16     Temp 04/20/15 1505 98.6 F (37 C)     Temp Source 04/20/15 1505 Oral     SpO2 04/20/15 1505 99 %     Weight 04/20/15 1505 160 lb (72.576 kg)     Height 04/20/15 1505  (1.651 m)     Head Cir --      Peak Flow --      Pain Score 04/20/15 1508 10     Pain Loc --      Pain Edu? --      Excl. in GC? --     Constitutional: Alert and oriented. Well appearing and in no acute distress. Eyes: Conjunctivae are normal. PERRL. EOMI. Head: Atraumatic.Nontender. NO swelling or ecchymosis.   Mouth/Throat: Mucous membranes are moist.  Oropharynx non-erythematous. Neck: No stridor.  No cervical spine tenderness to palpation. Hematological/Lymphatic/Immunilogical: No cervical lymphadenopathy. Cardiovascular: Normal rate, regular rhythm. Grossly normal heart sounds.  Good peripheral circulation. Respiratory: Normal respiratory effort.  No retractions. Lungs CTAB. Gastrointestinal: Soft and nontender. No distention. Normal Bowel sounds.  No abdominal bruits. No CVA tenderness. Musculoskeletal: No lower or upper extremity tenderness nor edema.  No joint effusions. Bilateral pedal pulses equal and easily palpated. No cervical, thoracic, or lumbar tenderness to palpation.  Except: Right dorsal hand along distal 4th and 5th metacarpals and at 4th and 5 th  MCP mod TTP with mild to mod swelling, mild ecchymosis, superficial abrasion to right 4th proximal phalanx, cap refill to all right digits < 2 seconds, pain with right hand grip, pain with 4th and 5th digit flexion and extension but full ROM present, no tendon or motor deficit noted. Bilateral distal radial pulses equal bilaterally. Sensation to right hand intact.  Neurologic:  Normal speech and language. No gross focal neurologic deficits are appreciated. No gait instability. Skin:  Skin is warm, dry  and intact. No rash noted. See musculoskeletal above.  Psychiatric: Mood and affect are normal. Speech and behavior are normal.  ____________________________________________   LABS (all labs ordered are listed, but only abnormal results are displayed)  Labs Reviewed - No data to display  RADIOLOGY  EXAM: RIGHT HAND - COMPLETE 3+ VIEW  COMPARISON: Right hand films of 05/15/2007  FINDINGS: There is soft tissue swelling at the level of the right fifth MCP joint, but no fracture is seen. Alignment is normal. The joint spaces appear normal.  IMPRESSION: Negative.   Electronically Signed By: Dwyane DeePaul Barry M.D. On: 04/20/2015 15:45  I, Renford DillsLindsey Trisa Cranor, personally viewed and evaluated these images (plain radiographs) as part of my medical decision making.  Also reviewed xray with supervising physician Dr Val Eagle. Conty who also agrees negative.  ____________________________________________   PROCEDURES  Procedure(s) performed:   Procedure(s) performed:  Procedure explained and verbal consent obtained. Consent: Verbal consent obtained. Written consent not obtained. Risks and benefits: risks, benefits and alternatives were discussed Patient identity confirmed: verbally with patient and hospital-assigned identification number  Consent given by: patient   Laceration Repair Location: right dorsal hand Length: 1 cm abrasion Foreign bodies: no foreign bodies Tendon involvement: none Nerve involvement: none Preparation: Patient was prepped and draped in the usual sterile fashion. Irrigation solution: saline and betadine Irrigation method: jet lavage Amount of cleaning: copious No repair indicated. Patient tolerate well. Wound well approximated post repair.  Antibiotic ointment and dressing applied.  Wound care instructions provided.  Observe for any signs of infection or other problems.      velcro cock up wrist splint applied and 4/5 fingers buddy taped by RN. Neurovascular  intact post splint.  ____________________________________   INITIAL IMPRESSION / ASSESSMENT AND PLAN / ED COURSE  Pertinent labs & imaging results that were available during my care of the patient were reviewed by me and considered in my medical decision making (see chart for details).  Very well-appearing patient. No acute distress. Presents for the complaint of dorsal hand pain post TV accidentally following: Right hand. Denies head injury or loss of consciousness. Denies other pain or injury. Tetanus immunization updated. Abrasion present at proximal fourth phalanx. Abrasion cleaned and irrigated, no repair indicated, bacitracin ointment and dressing applied. Right hand x-ray negative for acute bony abnormality, soft tissue swelling at the level of the right fifth MCP joint alignment is normal, joint spaces appear normal per radiology. No tendon or motor deficits appreciated. Will treat supportively. Right Velcro cockup wrist splint applied to support hand as well as fourth and fifth fingers buddy taped. Patient to take when necessary over-the-counter Tylenol or ibuprofen and a prescription given for tramadol as needed for breakthrough pain. Apply ice and elevate. Information for Dr. Deeann SaintHoward Tamma Brigandi orthopedist on-call given for patient follow-up with as needed for continued pain.Discussed follow up with Primary care physician this week. Discussed follow up and return parameters including no resolution or any worsening concerns. Patient verbalized understanding and agreed to plan.   ____________________________________________  FINAL CLINICAL IMPRESSION(S) / ED DIAGNOSES  Final diagnoses:  Hand contusion, right, initial encounter  Hand abrasion, right, initial encounter       Renford Dills, NP 04/20/15 1723

## 2016-01-10 ENCOUNTER — Emergency Department: Payer: Self-pay

## 2016-01-10 DIAGNOSIS — Z87891 Personal history of nicotine dependence: Secondary | ICD-10-CM | POA: Insufficient documentation

## 2016-01-10 DIAGNOSIS — M940 Chondrocostal junction syndrome [Tietze]: Secondary | ICD-10-CM | POA: Insufficient documentation

## 2016-01-10 DIAGNOSIS — Z79899 Other long term (current) drug therapy: Secondary | ICD-10-CM | POA: Insufficient documentation

## 2016-01-10 NOTE — ED Notes (Signed)
Pt in with co right rib pain states no injury just felt a "pop", states worse when she takes a deep breath and tender on palpation.

## 2016-01-11 ENCOUNTER — Emergency Department
Admission: EM | Admit: 2016-01-11 | Discharge: 2016-01-11 | Disposition: A | Discharge status: Home / Self Care | Payer: Self-pay | Attending: Emergency Medicine | Admitting: Emergency Medicine

## 2016-01-11 DIAGNOSIS — M940 Chondrocostal junction syndrome [Tietze]: Secondary | ICD-10-CM

## 2016-01-11 LAB — CBC
HEMATOCRIT: 38.8 % (ref 35.0–47.0)
Hemoglobin: 13.4 g/dL (ref 12.0–16.0)
MCH: 30.7 pg (ref 26.0–34.0)
MCHC: 34.7 g/dL (ref 32.0–36.0)
MCV: 88.5 fL (ref 80.0–100.0)
Platelets: 191 10*3/uL (ref 150–440)
RBC: 4.39 MIL/uL (ref 3.80–5.20)
RDW: 13.1 % (ref 11.5–14.5)
WBC: 9.5 10*3/uL (ref 3.6–11.0)

## 2016-01-11 LAB — BASIC METABOLIC PANEL
Anion gap: 8 (ref 5–15)
BUN: 11 mg/dL (ref 6–20)
CALCIUM: 9.2 mg/dL (ref 8.9–10.3)
CO2: 22 mmol/L (ref 22–32)
CREATININE: 0.72 mg/dL (ref 0.44–1.00)
Chloride: 107 mmol/L (ref 101–111)
GFR calc Af Amer: >60 mL/min (ref >60)
GFR calc non Af Amer: >60 mL/min (ref >60)
GLUCOSE: 98 mg/dL (ref 65–99)
Potassium: 3.2 mmol/L — ABNORMAL LOW (ref 3.5–5.1)
Sodium: 137 mmol/L (ref 135–145)

## 2016-01-11 LAB — FIBRIN DERIVATIVES D-DIMER (ARMC ONLY): Fibrin derivatives D-dimer (ARMC): 422 (ref 0–499)

## 2016-01-11 LAB — TROPONIN I: Troponin I: <0.03 ng/mL (ref <0.03)

## 2016-01-11 MED ORDER — CYCLOBENZAPRINE HCL 5 MG PO TABS: 5.0000 mg | ORAL_TABLET | Freq: Three times a day (TID) | ORAL | Status: DC | PRN

## 2016-01-11 MED ORDER — KETOROLAC TROMETHAMINE 60 MG/2ML IM SOLN
60.0000 mg | Freq: Once | INTRAMUSCULAR | Status: AC
  Administered 2016-01-11: 60 mg via INTRAMUSCULAR
  Filled 2016-01-11: qty 2

## 2016-01-11 MED ORDER — IBUPROFEN 600 MG PO TABS: 600.0000 mg | ORAL_TABLET | Freq: Three times a day (TID) | ORAL | Status: DC | PRN

## 2016-01-11 NOTE — Discharge Instructions (Signed)
Please seek medical attention for any high fevers, chest pain, shortness of breath, change in behavior, persistent vomiting, bloody stool or any other new or concerning symptoms.   Costochondritis Costochondritis, sometimes called Tietze syndrome, is a swelling and irritation (inflammation) of the tissue (cartilage) that connects your ribs with your breastbone (sternum). It causes pain in the chest and rib area. Costochondritis usually goes away on its own over time. It can take up to 6 weeks or longer to get better, especially if you are unable to limit your activities. CAUSES  Some cases of costochondritis have no known cause. Possible causes include:  Injury (trauma).  Exercise or activity such as lifting.  Severe coughing. SIGNS AND SYMPTOMS  Pain and tenderness in the chest and rib area.  Pain that gets worse when coughing or taking deep breaths.  Pain that gets worse with specific movements. DIAGNOSIS  Your health care provider will do a physical exam and ask about your symptoms. Chest X-rays or other tests may be done to rule out other problems. TREATMENT  Costochondritis usually goes away on its own over time. Your health care provider may prescribe medicine to help relieve pain. HOME CARE INSTRUCTIONS   Avoid exhausting physical activity. Try not to strain your ribs during normal activity. This would include any activities using chest, abdominal, and side muscles, especially if heavy weights are used.  Apply ice to the affected area for the first 2 days after the pain begins.  Put ice in a plastic bag.  Place a towel between your skin and the bag.  Leave the ice on for 20 minutes, 2-3 times a day.  Only take over-the-counter or prescription medicines as directed by your health care provider. SEEK MEDICAL CARE IF:  You have redness or swelling at the rib joints. These are signs of infection.  Your pain does not go away despite rest or medicine. SEEK IMMEDIATE MEDICAL  CARE IF:   Your pain increases or you are very uncomfortable.  You have shortness of breath or difficulty breathing.  You cough up blood.  You have worse chest pains, sweating, or vomiting.  You have a fever or persistent symptoms for more than 2-3 days.  You have a fever and your symptoms suddenly get worse. MAKE SURE YOU:   Understand these instructions.  Will watch your condition.  Will get help right away if you are not doing well or get worse.   This information is not intended to replace advice given to you by your health care provider. Make sure you discuss any questions you have with your health care provider.   Document Released: 03/28/2005 Document Revised: 04/08/2013 Document Reviewed: 01/20/2013 Elsevier Interactive Patient Education [employer] Elsevier Inc[employer]

## 2016-01-11 NOTE — ED Provider Notes (Signed)
[facility] Emergency Department Provider Note    ____________________________________________  Time seen: ~0530  I have reviewed the triage vital signs and the nursing notes.   HISTORY  Chief Complaint Rib Injury   History limited by: Not Limited   HPI Deborah Lambert is a 30 y.o. female who presents to the emergency department today because of concerns for right lower chest pain. The patient says that the pain started suddenly. She describes it as sharp. It is located in her right lower chest and below the right breast. It is worse with pressure, lying on that side and deep breaths. Patient has not had any cough. No fevers. No trauma to the chest wall. She denies similar symptoms in the past.   Past Medical History  Diagnosis Date  . Cluster headaches   . Anxiety   . Focal seizures (HCC)   . Bell's palsy     There are no active problems to display for this patient.   Past Surgical History  Procedure Laterality Date  . Right arm surgery      Current Outpatient Rx  Name  Route  Sig  Dispense  Refill  . EXPIRED: prochlorperazine (COMPAZINE) 10 MG tablet   Oral   Take 1 tablet (10 mg total) by mouth every 6 (six) hours as needed (Headache).   15 tablet   0   . traMADol (ULTRAM) 50 MG tablet   Oral   Take 1 tablet (50 mg total) by mouth every 8 (eight) hours as needed (Do not drive or operate machinery while taking as can cause drowsiness.).   12 tablet   0     Allergies Aspirin and Codeine  No family history on file.  Social History Social History  Substance Use Topics  . Smoking status: Former Games developer  . Smokeless tobacco: Never Used  . Alcohol Use: No    Review of Systems  Constitutional: Negative for fever. Cardiovascular: Positive for right lower chest pain Respiratory: Negative for shortness of breath. Gastrointestinal: Negative for abdominal pain, vomiting and diarrhea. Genitourinary: Negative for  dysuria. Musculoskeletal: Negative for back pain. Skin: Negative for rash. Neurological: Negative for headaches, focal weakness or numbness.  10-point ROS otherwise negative.  ____________________________________________   PHYSICAL EXAM:  VITAL SIGNS: ED Triage Vitals  Enc Vitals Group     BP 01/10/16 2344 95/40 mmHg     Pulse Rate 01/10/16 2344 73     Resp 01/10/16 2344 18     Temp 01/10/16 2344 97.5 F (36.4 C)     Temp Source 01/10/16 2344 Oral     SpO2 01/10/16 2344 97 %     Weight 01/10/16 2344 150 lb (68.04 kg)     Height 01/10/16 2344  (1.626 m)     Head Cir --      Peak Flow --      Pain Score 01/10/16 2344 10   Constitutional: Alert and oriented. Well appearing and in no distress. Eyes: Conjunctivae are normal. PERRL. Normal extraocular movements. ENT   Head: Normocephalic and atraumatic.   Nose: No congestion/rhinnorhea.   Mouth/Throat: Mucous membranes are moist.   Neck: No stridor. Hematological/Lymphatic/Immunilogical: No cervical lymphadenopathy. Cardiovascular: Normal rate, regular rhythm.  No murmurs, rubs, or gallops.Tender to palpation underneath the right breast and right lower chest Respiratory: Normal respiratory effort without tachypnea nor retractions. Breath sounds are clear and equal bilaterally. No wheezes/rales/rhonchi. Gastrointestinal: Soft and nontender. No distention. There is no CVA tenderness. Genitourinary: Deferred Musculoskeletal: Normal  range of motion in all extremities. No joint effusions.  No lower extremity tenderness nor edema. Neurologic:  Normal speech and language. No gross focal neurologic deficits are appreciated.  Skin:  Skin is warm, dry and intact. No rash noted. Psychiatric: Mood and affect are normal. Speech and behavior are normal. Patient exhibits appropriate insight and judgment.  ____________________________________________    LABS (pertinent positives/negatives)  Labs Reviewed  BASIC METABOLIC  PANEL - Abnormal; Notable for the following:    Potassium 3.2 (*)    All other components within normal limits  CBC  TROPONIN I  FIBRIN DERIVATIVES D-DIMER ([facility] ONLY)     ____________________________________________   EKG  I, Phineas SemenGraydon Marnesha Gagen, attending physician, personally viewed and interpreted this EKG  EKG Time: 2342 Rate: 76 Rhythm: sinus rhythm with PVC Axis: normal Intervals: qtc 443 QRS: narrow ST changes: no st elevation Impression: normal ekg with PVC   ____________________________________________    RADIOLOGY  Right ribs  IMPRESSION: No evidence of active pulmonary disease. Negative right ribs.  ____________________________________________   PROCEDURES  Procedure(s) performed: None  Critical Care performed: No  ____________________________________________   INITIAL IMPRESSION / ASSESSMENT AND PLAN / ED COURSE  Pertinent labs & imaging results that were available during my care of the patient were reviewed by me and considered in my medical decision making (see chart for details).  Patient presents to the emergency department today because of concerns for right lower chest pain. On exam patient is tender to palpation in that area. Blood work including troponin and d-dimer negative. No leukocytosis. Rib x-ray on that side did not show any acute osseous injury, pneumothorax. This point I think likely patient suffering from costochondritis or muscle pain. Will discharge with high-dose ibuprofen and muscle relaxant.  ____________________________________________   FINAL CLINICAL IMPRESSION(S) / ED DIAGNOSES  Final diagnoses:  Costochondritis     Note: This dictation was prepared with Dragon dictation. Any transcriptional errors that result from this process are unintentional    Phineas SemenGraydon Vicke Plotner, MD 01/11/16 [id number]

## 2016-01-11 NOTE — ED Notes (Signed)
Discharge instructions reviewed with patient. Patient verbalized understanding. Patient taken to lobby via wheelchair by her husband

## 2016-09-10 ENCOUNTER — Emergency Department
Admission: EM | Admit: 2016-09-10 | Discharge: 2016-09-10 | Disposition: A | Discharge status: Home / Self Care | Payer: Self-pay | Attending: Emergency Medicine | Admitting: Emergency Medicine

## 2016-09-10 ENCOUNTER — Encounter: Payer: Self-pay | Admitting: Emergency Medicine

## 2016-09-10 ENCOUNTER — Emergency Department: Payer: Self-pay

## 2016-09-10 DIAGNOSIS — Z79899 Other long term (current) drug therapy: Secondary | ICD-10-CM | POA: Insufficient documentation

## 2016-09-10 DIAGNOSIS — Z791 Long term (current) use of non-steroidal anti-inflammatories (NSAID): Secondary | ICD-10-CM | POA: Insufficient documentation

## 2016-09-10 DIAGNOSIS — G43801 Other migraine, not intractable, with status migrainosus: Secondary | ICD-10-CM | POA: Insufficient documentation

## 2016-09-10 DIAGNOSIS — Z87891 Personal history of nicotine dependence: Secondary | ICD-10-CM | POA: Insufficient documentation

## 2016-09-10 HISTORY — DX: Unspecified convulsions: R56.9

## 2016-09-10 HISTORY — DX: Post-traumatic stress disorder, unspecified: F43.10

## 2016-09-10 LAB — CBC WITH DIFFERENTIAL/PLATELET
Basophils Absolute: 0 10*3/uL (ref 0–0.1)
Basophils Relative: 0 %
EOS ABS: 0.1 10*3/uL (ref 0–0.7)
EOS PCT: 2 %
HCT: 37.1 % (ref 35.0–47.0)
Hemoglobin: 12.9 g/dL (ref 12.0–16.0)
Lymphocytes Relative: 27 %
Lymphs Abs: 1.9 10*3/uL (ref 1.0–3.6)
MCH: 31 pg (ref 26.0–34.0)
MCHC: 34.6 g/dL (ref 32.0–36.0)
MCV: 89.4 fL (ref 80.0–100.0)
MONOS PCT: 6 %
Monocytes Absolute: 0.5 10*3/uL (ref 0.2–0.9)
Neutro Abs: 4.7 10*3/uL (ref 1.4–6.5)
Neutrophils Relative %: 65 %
PLATELETS: 210 10*3/uL (ref 150–440)
RBC: 4.15 MIL/uL (ref 3.80–5.20)
RDW: 13.1 % (ref 11.5–14.5)
WBC: 7.3 10*3/uL (ref 3.6–11.0)

## 2016-09-10 LAB — BASIC METABOLIC PANEL
Anion gap: 3 — ABNORMAL LOW (ref 5–15)
BUN: 7 mg/dL (ref 6–20)
CO2: 27 mmol/L (ref 22–32)
Calcium: 8.6 mg/dL — ABNORMAL LOW (ref 8.9–10.3)
Chloride: 107 mmol/L (ref 101–111)
Creatinine, Ser: 0.62 mg/dL (ref 0.44–1.00)
GFR calc Af Amer: >60 mL/min (ref >60)
GFR calc non Af Amer: >60 mL/min (ref >60)
Glucose, Bld: 95 mg/dL (ref 65–99)
Potassium: 3.3 mmol/L — ABNORMAL LOW (ref 3.5–5.1)
SODIUM: 137 mmol/L (ref 135–145)

## 2016-09-10 MED ORDER — KETOROLAC TROMETHAMINE 30 MG/ML IJ SOLN
15.0000 mg | INTRAMUSCULAR | Status: AC
  Administered 2016-09-10: 15 mg via INTRAVENOUS
  Filled 2016-09-10: qty 1

## 2016-09-10 MED ORDER — METOCLOPRAMIDE HCL 5 MG/ML IJ SOLN
10.0000 mg | Freq: Once | INTRAMUSCULAR | Status: AC
  Administered 2016-09-10: 10 mg via INTRAVENOUS
  Filled 2016-09-10: qty 2

## 2016-09-10 MED ORDER — NAPROXEN 500 MG PO TABS: 500.0000 mg | ORAL_TABLET | Freq: Two times a day (BID) | ORAL | 0 refills | Status: DC

## 2016-09-10 MED ORDER — DIPHENHYDRAMINE HCL 25 MG PO CAPS: 50.0000 mg | ORAL_CAPSULE | Freq: Four times a day (QID) | ORAL | 0 refills | Status: DC | PRN

## 2016-09-10 MED ORDER — DIPHENHYDRAMINE HCL 50 MG/ML IJ SOLN
25.0000 mg | Freq: Once | INTRAMUSCULAR | Status: AC
  Administered 2016-09-10: 25 mg via INTRAVENOUS
  Filled 2016-09-10: qty 1

## 2016-09-10 MED ORDER — METOCLOPRAMIDE HCL 10 MG PO TABS: 10.0000 mg | ORAL_TABLET | Freq: Four times a day (QID) | ORAL | 0 refills | Status: DC | PRN

## 2016-09-10 NOTE — ED Notes (Signed)
Patient reports hx of cluster headaches, however reports this feels different than her cluster headaches

## 2016-09-10 NOTE — ED Provider Notes (Signed)
[facility] Emergency Department Provider Note  ____________________________________________  Time seen: Approximately 4:29 AM  I have reviewed the triage vital signs and the nursing notes.   HISTORY  Chief Complaint Headache    HPI Deborah Lambert is a 31 y.o. female complains of right-sided headache for the past week. No vision changes numbness tingling or weakness. She is having light sensitivity and feeling of pressure on the right parietal scalp and behind her ear. Also feels nauseated and dizzy. Has a history of cluster headaches but feels like this is different. No trauma. No fever or vomiting. No neck pain or stiffness.  Reports that she is supposed to be on antiepileptic drugs for a seizure disorder, but she can't afford them and is been off them for a long time. She has not seen a doctor in a long time..  Patient describes pain as severe and sharp. Right sided. No aggravating or alleviating factors.   Past Medical History:  Diagnosis Date  . Anxiety   . Bell's palsy   . Cluster headaches   . Focal seizures (HCC)   . PTSD (post-traumatic stress disorder)   . Seizures (HCC)      There are no active problems to display for this patient.    Past Surgical History:  Procedure Laterality Date  . right arm surgery       Prior to Admission medications   Medication Sig Start Date End Date Taking? Authorizing Provider  cyclobenzaprine (FLEXERIL) 5 MG tablet Take 1 tablet (5 mg total) by mouth 3 (three) times daily as needed (pain). 01/11/16   Phineas Semen, MD  diphenhydrAMINE (BENADRYL) 25 mg capsule Take 2 capsules (50 mg total) by mouth every 6 (six) hours as needed. 09/10/16   Sharman Cheek, MD  ibuprofen (ADVIL,MOTRIN) 600 MG tablet Take 1 tablet (600 mg total) by mouth every 8 (eight) hours as needed. 01/11/16   Phineas Semen, MD  metoCLOPramide (REGLAN) 10 MG tablet Take 1 tablet (10 mg total) by mouth every 6 (six) hours as needed.  09/10/16   Sharman Cheek, MD  naproxen (NAPROSYN) 500 MG tablet Take 1 tablet (500 mg total) by mouth 2 (two) times daily with a meal. 09/10/16   Sharman Cheek, MD  prochlorperazine (COMPAZINE) 10 MG tablet Take 1 tablet (10 mg total) by mouth every 6 (six) hours as needed (Headache). 12/15/14 12/15/15  Phineas Semen, MD  traMADol (ULTRAM) 50 MG tablet Take 1 tablet (50 mg total) by mouth every 8 (eight) hours as needed (Do not drive or operate machinery while taking as can cause drowsiness.). 04/20/15   Renford Dills, NP     Allergies Aspirin; Codeine; Almond oil; and Coconut oil   No family history on file.  Social History Social History  Substance Use Topics  . Smoking status: Former Games developer  . Smokeless tobacco: Never Used  . Alcohol use No    Review of Systems  Constitutional:   No fever or chills.  ENT:   No sore throat. No rhinorrhea. Cardiovascular:   No chest pain. Respiratory:   No dyspnea or cough. Gastrointestinal:   Negative for abdominal pain, vomiting and diarrhea.  Genitourinary:   Negative for dysuria or difficulty urinating. Musculoskeletal:   Negative for focal pain or swelling Neurological:   Positive as above for headaches 10-point ROS otherwise negative.  ____________________________________________   PHYSICAL EXAM:  VITAL SIGNS: ED Triage Vitals  Enc Vitals Group     BP 09/10/16 0110 109/78  Pulse Rate 09/10/16 0110 95     Resp 09/10/16 0110 18     Temp 09/10/16 0110 98.1 F (36.7 C)     Temp Source 09/10/16 0110 Oral     SpO2 09/10/16 0105 99 %     Weight 09/10/16 0109 152 lb (68.9 kg)     Height 09/10/16 0109 5\' 4"  (1.626 m)     Head Circumference --      Peak Flow --      Pain Score 09/10/16 0109 10     Pain Loc --      Pain Edu? --      Excl. in GC? --     Vital signs reviewed, nursing assessments reviewed.   Constitutional:   Alert and oriented. Well appearing and in no distress. Eyes:   No scleral icterus. No  conjunctival pallor. PERRL. EOMI.  No nystagmus. ENT   Head:   Normocephalic and atraumatic.   Nose:   No congestion/rhinnorhea. No septal hematoma   Mouth/Throat:   MMM, no pharyngeal erythema. No peritonsillar mass.    Neck:   No stridor. No SubQ emphysema. No meningismus. Hematological/Lymphatic/Immunilogical:   No cervical lymphadenopathy. Cardiovascular:   RRR. Symmetric bilateral radial and DP pulses.  No murmurs.  Respiratory:   Normal respiratory effort without tachypnea nor retractions. Breath sounds are clear and equal bilaterally. No wheezes/rales/rhonchi. Gastrointestinal:   Soft and nontender. Non distended. There is no CVA tenderness.  No rebound, rigidity, or guarding. Genitourinary:   deferred Musculoskeletal:   Normal range of motion in all extremities. No joint effusions.  No lower extremity tenderness.  No edema. Neurologic:   Normal speech and language.  CN 2-10 normal. Motor grossly intact. Normal gait. Normal cerebellar function. No gross focal neurologic deficits are appreciated.  Skin:    Skin is warm, dry and intact. No rash noted.  No petechiae, purpura, or bullae.  ____________________________________________    LABS (pertinent positives/negatives) (all labs ordered are listed, but only abnormal results are displayed) Labs Reviewed  BASIC METABOLIC PANEL - Abnormal; Notable for the following:       Result Value   Potassium 3.3 (*)    Calcium 8.6 (*)    Anion gap 3 (*)    All other components within normal limits  CBC WITH DIFFERENTIAL/PLATELET   ____________________________________________   EKG    ____________________________________________    RADIOLOGY  Ct Head Wo Contrast  Result Date: 09/10/2016 CLINICAL DATA:  31 year old female with right temporal headache for 1 week. EXAM: CT HEAD WITHOUT CONTRAST TECHNIQUE: Contiguous axial images were obtained from the base of the skull through the vertex without intravenous contrast.  COMPARISON:  Head CT dated 11/01/2013 FINDINGS: Brain: No evidence of acute infarction, hemorrhage, hydrocephalus, extra-axial collection or mass lesion/mass effect. Vascular: No hyperdense vessel or unexpected calcification. Skull: Normal. Negative for fracture or focal lesion. Sinuses/Orbits: No acute finding. Other: None. IMPRESSION: No acute intracranial pathology. Electronically Signed   By: Elgie Collard M.D.   On: 09/10/2016 03:59    ____________________________________________   PROCEDURES Procedures  ____________________________________________   INITIAL IMPRESSION / ASSESSMENT AND PLAN / ED COURSE  Pertinent labs & imaging results that were available during my care of the patient were reviewed by me and considered in my medical decision making (see chart for details).  Patient presents with subacute severe headache, gradual onset. Has a history of severe headaches but feels like this is different. Therefore CT scan was obtained which was unremarkable. Patient was treated symptomatically and  her headache resolved quickly.Considering the patient's symptoms, medical history, and physical examination today, I have low suspicion for ischemic stroke, intracranial hemorrhage, meningitis, encephalitis, carotid or vertebral dissection, venous sinus thrombosis, MS, intracranial hypertension, glaucoma, CRAO, CRVO, or temporal arteritis. Symptoms are consistent with a migraine type headache. Low suspicion for focal or subclinical seizure. Follow-up primary care.         ____________________________________________   FINAL CLINICAL IMPRESSION(S) / ED DIAGNOSES  Final diagnoses:  Other migraine with status migrainosus, not intractable      New Prescriptions   DIPHENHYDRAMINE (BENADRYL) 25 MG CAPSULE    Take 2 capsules (50 mg total) by mouth every 6 (six) hours as needed.   METOCLOPRAMIDE (REGLAN) 10 MG TABLET    Take 1 tablet (10 mg total) by mouth every 6 (six) hours as needed.    NAPROXEN (NAPROSYN) 500 MG TABLET    Take 1 tablet (500 mg total) by mouth 2 (two) times daily with a meal.     Portions of this note were generated with dragon dictation software. Dictation errors may occur despite best attempts at proofreading.    Sharman CheekPhillip Angeleena Dueitt, MD 09/10/16 [phone]

## 2016-09-10 NOTE — ED Notes (Signed)
Patient transported to CT 

## 2016-09-10 NOTE — ED Notes (Signed)
Patient c/o headache X 1 week with increasing severity today. Pt c/o light sensitivity, pressure behind ear, weakness on right side, nausea, and dizziness. Patient reports spots in her vision

## 2016-09-10 NOTE — Discharge Instructions (Signed)
Your blood tests and CT scan of the head were unremarkable. Take medications as prescribed for symptom control if your headache recurs.  Follow up with primary care for continued monitoring of your symptoms.

## 2016-09-10 NOTE — ED Triage Notes (Signed)
Pt arrived via EMS from local hotel with c/o headache for 2 days that worsened about 1 hour ago; pt brought out to lobby for triage; awake and alert, holding the right side of her head;

## 2017-12-10 ENCOUNTER — Emergency Department
Admission: EM | Admit: 2017-12-10 | Discharge: 2017-12-10 | Disposition: A | Discharge status: Home / Self Care | Payer: Medicaid Other | Attending: Emergency Medicine | Admitting: Emergency Medicine

## 2017-12-10 ENCOUNTER — Other Ambulatory Visit: Payer: Self-pay

## 2017-12-10 ENCOUNTER — Encounter: Payer: Self-pay | Admitting: Emergency Medicine

## 2017-12-10 ENCOUNTER — Emergency Department: Payer: Medicaid Other

## 2017-12-10 DIAGNOSIS — S52122A Displaced fracture of head of left radius, initial encounter for closed fracture: Secondary | ICD-10-CM

## 2017-12-10 DIAGNOSIS — Y929 Unspecified place or not applicable: Secondary | ICD-10-CM | POA: Insufficient documentation

## 2017-12-10 DIAGNOSIS — S8000XA Contusion of unspecified knee, initial encounter: Secondary | ICD-10-CM

## 2017-12-10 DIAGNOSIS — Y999 Unspecified external cause status: Secondary | ICD-10-CM | POA: Insufficient documentation

## 2017-12-10 DIAGNOSIS — R52 Pain, unspecified: Secondary | ICD-10-CM

## 2017-12-10 DIAGNOSIS — Y9389 Activity, other specified: Secondary | ICD-10-CM | POA: Insufficient documentation

## 2017-12-10 DIAGNOSIS — W19XXXA Unspecified fall, initial encounter: Secondary | ICD-10-CM

## 2017-12-10 DIAGNOSIS — W08XXXA Fall from other furniture, initial encounter: Secondary | ICD-10-CM | POA: Insufficient documentation

## 2017-12-10 MED ORDER — OXYCODONE-ACETAMINOPHEN 5-325 MG PO TABS
1.0000 | ORAL_TABLET | Freq: Once | ORAL | Status: AC
  Administered 2017-12-10: 1 via ORAL
  Filled 2017-12-10: qty 1

## 2017-12-10 MED ORDER — OXYCODONE-ACETAMINOPHEN 5-325 MG PO TABS: 1.0000 | ORAL_TABLET | ORAL | 0 refills | Status: DC | PRN

## 2017-12-10 NOTE — Discharge Instructions (Addendum)
Call [facility].  You will need to be seen by the orthopedics department for evaluation of this fracture.  Apply ice to the elbow at least 3 times a day for 20 minutes.  You can take the Percocet for pain as needed.  You could also take over-the-counter ibuprofen.

## 2017-12-10 NOTE — ED Provider Notes (Signed)
[facility] Emergency Department Provider Note  ____________________________________________   First MD Initiated Contact with Patient 12/10/17 [phone]     (approximate)  I have reviewed the triage vital signs and the nursing notes.   HISTORY  Chief Complaint Arm Injury    HPI Deborah Lambert is a 32 y.o. female presents emergency department.  States she stood on a rolling stool and it went out from under her.  She landed on her left elbow.  She denies any head injury.  She denies neck pain.  She does have an abrasion on the knee.  Past Medical History:  Diagnosis Date  . Anxiety   . Bell's palsy   . Cluster headaches   . Focal seizures (HCC)   . PTSD (post-traumatic stress disorder)   . Seizures (HCC)     There are no active problems to display for this patient.   Past Surgical History:  Procedure Laterality Date  . right arm surgery      Prior to Admission medications   Medication Sig Start Date End Date Taking? Authorizing Provider  oxyCODONE-acetaminophen (PERCOCET/ROXICET) 5-325 MG tablet Take 1 tablet by mouth every 4 (four) hours as needed for severe pain. 12/10/17   Fisher, Roselyn BeringSusan W, PA-C    Allergies Aspirin; Codeine; Almond oil; and Coconut oil  No family history on file.  Social History Social History   Tobacco Use  . Smoking status: Former Games developermoker  . Smokeless tobacco: Never Used  Substance Use Topics  . Alcohol use: No  . Drug use: No    Review of Systems  Constitutional: No fever/chills Eyes: No visual changes. ENT: No sore throat. Respiratory: Denies cough Genitourinary: Negative for dysuria. Musculoskeletal: Negative for back pain.  Positive for left elbow and forearm pain Skin: Negative for rash.    ____________________________________________   PHYSICAL EXAM:  VITAL SIGNS: ED Triage Vitals  Enc Vitals Group     BP 12/10/17 0404 126/70     Pulse Rate 12/10/17 0404 (!) 106     Resp 12/10/17 0404 20       Temp 12/10/17 0404 98.4 F (36.9 C)     Temp Source 12/10/17 0404 Oral     SpO2 12/10/17 0404 99 %     Weight 12/10/17 0402 145 lb (65.8 kg)     Height 12/10/17 0402 5\' 4"  (1.626 m)     Head Circumference --      Peak Flow --      Pain Score 12/10/17 0402 10     Pain Loc --      Pain Edu? --      Excl. in GC? --     Constitutional: Alert and oriented. Well appearing and in no acute distress. Eyes: Conjunctivae are normal.  Head: Atraumatic. Nose: No congestion/rhinnorhea. Mouth/Throat: Mucous membranes are moist.   Neck: Is supple, no lymphadenopathy is noted, no cervical tenderness is noted Cardiovascular: Normal rate, regular rhythm.  Heart sounds are normal Respiratory: Normal respiratory effort.  No retractions, lungs are clear to auscultation GU: deferred Musculoskeletal: The left elbow and left forearm are tender to palpation.  Pain is reproduced with any movement of the left elbow.  She is neurovascularly intact. Neurologic:  Normal speech and language.  Skin:  Skin is warm, dry and intact. No rash noted. Psychiatric: Mood and affect are normal. Speech and behavior are normal.  ____________________________________________   LABS (all labs ordered are listed, but only abnormal results are displayed)  Labs Reviewed -  No data to display ____________________________________________   ____________________________________________  RADIOLOGY  X-ray of the left elbow shows a proximal radial head fracture.  Is intra-articular and displaced.  Left forearm shows the same fracture.  ____________________________________________   PROCEDURES  Procedure(s) performed:   .Splint Application Date/Time: 12/10/2017 3:45 PM Performed by: Madelyn Flavors, NT Authorized by: Faythe Ghee, PA-C   Consent:    Consent obtained:  Verbal   Consent given by:  Patient   Risks discussed:  Discoloration, numbness, pain and swelling   Alternatives discussed:  Delayed  treatment Pre-procedure details:    Sensation:  Normal Procedure details:    Laterality:  Left   Location:  Elbow   Elbow:  L elbow   Splint type:  Long arm   Supplies:  Sling Post-procedure details:    Pain:  Improved   Sensation:  Normal   Patient tolerance of procedure:  Tolerated well, no immediate complications      ____________________________________________   INITIAL IMPRESSION / ASSESSMENT AND PLAN / ED COURSE  Pertinent labs & imaging results that were available during my care of the patient were reviewed by me and considered in my medical decision making (see chart for details).  Patient is 32 year old female presents emergency department after falling off a rolling stool in the left elbow.  Splane left elbow pain.  She denies any other injuries.  Physical exam the left elbow is tender and swollen.  The left forearm is tender.  The remainder the exam is unremarkable  X-ray of the left elbow and left forearm both show a radial head fracture which is intra-articular.  Explained the x-ray results to the patient.  She was placed in a long-arm OCL by the tech.  She was neurovascularly intact post splint application.  She was also given a sling.  She is to follow-up with orthopedics.  She was given a prescription for pain medication, oxycodone 5/325 #20 with no refill.  She is to apply ice to the left elbow.  She is to return to the emergency department if worsening.  She was discharged in stable condition.       As part of my medical decision making, I reviewed the following data within the electronic MEDICAL RECORD NUMBER Nursing notes reviewed and incorporated, Old chart reviewed, Radiograph reviewed x-ray of the left elbow and left forearm show radial head fracture, Notes from prior ED visits and [place] Controlled Substance Database  ____________________________________________   FINAL CLINICAL IMPRESSION(S) / ED DIAGNOSES  Final diagnoses:  Closed displaced fracture of  head of left radius, initial encounter  Fall, initial encounter  Contusion of knee, unspecified laterality, initial encounter      NEW MEDICATIONS STARTED DURING THIS VISIT:  Discharge Medication List as of 12/10/2017  7:35 AM    START taking these medications   Details  oxyCODONE-acetaminophen (PERCOCET/ROXICET) 5-325 MG tablet Take 1 tablet by mouth every 4 (four) hours as needed for severe pain., Starting Tue 12/10/2017, Print         Note:  This document was prepared using Dragon voice recognition software and may include unintentional dictation errors.    Faythe Ghee, PA-C 12/10/17 1548    Don Perking, [place], MD 12/11/17 1255

## 2017-12-10 NOTE — ED Notes (Signed)
See triage note  Presents s/p fall  States she fell from stool  Landed on left arm  Having pain and swelling to left elbow area  Abrasion noted to right knee
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
## 2018-01-26 ENCOUNTER — Other Ambulatory Visit: Payer: Self-pay

## 2018-01-26 ENCOUNTER — Encounter: Payer: Self-pay | Admitting: Emergency Medicine

## 2018-01-26 ENCOUNTER — Emergency Department
Admission: EM | Admit: 2018-01-26 | Discharge: 2018-01-26 | Disposition: A | Discharge status: Home / Self Care | Payer: Medicaid Other | Attending: Emergency Medicine | Admitting: Emergency Medicine

## 2018-01-26 DIAGNOSIS — Z87891 Personal history of nicotine dependence: Secondary | ICD-10-CM | POA: Insufficient documentation

## 2018-01-26 DIAGNOSIS — E860 Dehydration: Secondary | ICD-10-CM | POA: Insufficient documentation

## 2018-01-26 LAB — CBC WITH DIFFERENTIAL/PLATELET
BASOS PCT: 1 %
Basophils Absolute: 0.1 10*3/uL (ref 0–0.1)
EOS ABS: 0.1 10*3/uL (ref 0–0.7)
EOS PCT: 1 %
HCT: 37.5 % (ref 35.0–47.0)
Hemoglobin: 13.2 g/dL (ref 12.0–16.0)
Lymphocytes Relative: 11 %
Lymphs Abs: 1.2 10*3/uL (ref 1.0–3.6)
MCH: 31.3 pg (ref 26.0–34.0)
MCHC: 35.2 g/dL (ref 32.0–36.0)
MCV: 89.2 fL (ref 80.0–100.0)
MONO ABS: 0.6 10*3/uL (ref 0.2–0.9)
MONOS PCT: 6 %
NEUTROS ABS: 8.8 10*3/uL — AB (ref 1.4–6.5)
Neutrophils Relative %: 81 %
PLATELETS: 204 10*3/uL (ref 150–440)
RBC: 4.2 MIL/uL (ref 3.80–5.20)
RDW: 12.7 % (ref 11.5–14.5)
WBC: 10.8 10*3/uL (ref 3.6–11.0)

## 2018-01-26 LAB — URINALYSIS, COMPLETE (UACMP) WITH MICROSCOPIC
Bacteria, UA: NONE SEEN
Bilirubin Urine: NEGATIVE
Glucose, UA: NEGATIVE mg/dL
KETONES UR: 80 mg/dL — AB
Nitrite: NEGATIVE
PH: 5 (ref 5.0–8.0)
Protein, ur: 30 mg/dL — AB
Specific Gravity, Urine: 1.025 (ref 1.005–1.030)

## 2018-01-26 LAB — CK: CK TOTAL: 277 U/L — AB (ref 38–234)

## 2018-01-26 LAB — COMPREHENSIVE METABOLIC PANEL
ALT: 12 U/L (ref 0–44)
AST: 21 U/L (ref 15–41)
Albumin: 4.2 g/dL (ref 3.5–5.0)
Alkaline Phosphatase: 52 U/L (ref 38–126)
Anion gap: 10 (ref 5–15)
BUN: 11 mg/dL (ref 6–20)
CALCIUM: 8.9 mg/dL (ref 8.9–10.3)
CO2: 22 mmol/L (ref 22–32)
Chloride: 110 mmol/L (ref 98–111)
Creatinine, Ser: 0.85 mg/dL (ref 0.44–1.00)
GFR calc Af Amer: >60 mL/min (ref >60)
Glucose, Bld: 96 mg/dL (ref 70–99)
Potassium: 3.2 mmol/L — ABNORMAL LOW (ref 3.5–5.1)
Sodium: 142 mmol/L (ref 135–145)
TOTAL PROTEIN: 7.2 g/dL (ref 6.5–8.1)
Total Bilirubin: 1 mg/dL (ref 0.3–1.2)

## 2018-01-26 LAB — PREGNANCY, URINE: PREG TEST UR: NEGATIVE

## 2018-01-26 LAB — TROPONIN I: Troponin I: <0.03 ng/mL (ref <0.03)

## 2018-01-26 MED ORDER — SODIUM CHLORIDE 0.9 % IV BOLUS
1000.0000 mL | Freq: Once | INTRAVENOUS | Status: AC
  Administered 2018-01-26: 1000 mL via INTRAVENOUS

## 2018-01-26 NOTE — Discharge Instructions (Addendum)
Please follow-up using the information our social worker gave you.  Make sure you are drinking enough fluids.  Please return for any further problems.
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
## 2018-01-26 NOTE — ED Notes (Signed)
Pt given a cup for a urine sample. 

## 2018-01-26 NOTE — ED Provider Notes (Signed)
[facility] Emergency Department Provider Note   ____________________________________________   First MD Initiated Contact with Patient 01/26/18 1141     (approximate)  I have reviewed the triage vital signs and the nursing notes.   HISTORY  Chief Complaint Dehydration    HPI Deborah Lambert is a 32 y.o. female patient was walking from med been to [facility] felt faint and diaphoretic started having more spots than usual in front of her eyes so called EMS.  She did not drink very much water at all only 8 ounces she said.  Patient's past medical history includes cluster headaches anxiety focal seizures PTSD please see below  Past Medical History:  Diagnosis Date  . Anxiety   . Bell's palsy   . Cluster headaches   . Focal seizures (HCC)   . PTSD (post-traumatic stress disorder)   . Seizures (HCC)     There are no active problems to display for this patient.   Past Surgical History:  Procedure Laterality Date  . right arm surgery      Prior to Admission medications   Medication Sig Start Date End Date Taking? Authorizing Provider  oxyCODONE-acetaminophen (PERCOCET/ROXICET) 5-325 MG tablet Take 1 tablet by mouth every 4 (four) hours as needed for severe pain. 12/10/17   Fisher, Roselyn Bering, PA-C    Allergies Aspirin; Codeine; Almond oil; and Coconut oil  History reviewed. No pertinent family history.  Social History Social History   Tobacco Use  . Smoking status: Former Games developer  . Smokeless tobacco: Never Used  Substance Use Topics  . Alcohol use: No  . Drug use: No    Review of Systems  Constitutional: No fever/chills Eyes: Complains of spots in front of her eyes ENT: No sore throat. Cardiovascular: Denies chest pain. Respiratory: Denies shortness of breath. Gastrointestinal: No abdominal pain.  No nausea, no vomiting.  No diarrhea.  No constipation. Genitourinary: Negative for dysuria. Musculoskeletal: Negative for back  pain. Skin: Negative for rash. Neurological: Negative for headaches, focal weakness  ____________________________________________   PHYSICAL EXAM:  VITAL SIGNS: ED Triage Vitals  Enc Vitals Group     BP 01/26/18 1135 116/84     Pulse Rate 01/26/18 1135 (!) 102     Resp 01/26/18 1135 18     Temp 01/26/18 1135 97.8 F (36.6 C)     Temp Source 01/26/18 1135 Oral     SpO2 01/26/18 1135 98 %     Weight 01/26/18 1137 145 lb (65.8 kg)     Height 01/26/18 1137 5\' 4"  (1.626 m)     Head Circumference --      Peak Flow --      Pain Score 01/26/18 1137 0     Pain Loc --      Pain Edu? --      Excl. in GC? --     Constitutional: Alert and oriented. Well appearing and in no acute distress. Eyes: Conjunctivae are normal. PERRL. EOMI. fundi appear normal I do not see any floaters or anything else Head: Atraumatic. Nose: No congestion/rhinnorhea. Mouth/Throat: Mucous membranes are moist.  Oropharynx non-erythematous. Neck: No stridor. Cardiovascular: Normal rate, regular rhythm. Grossly normal heart sounds.  Good peripheral circulation. Respiratory: Normal respiratory effort.  No retractions. Lungs CTAB. Gastrointestinal: Soft and nontender. No distention. No abdominal bruits. No CVA tenderness. Musculoskeletal: No lower extremity tenderness nor edema.  No joint effusions. Neurologic:  Normal speech and language. No gross focal neurologic deficits are appreciated. No gait instability.  Skin:  Skin is warm, dry and intact. No rash noted. Psychiatric: Mood and affect are normal. Speech and behavior are normal.  ____________________________________________   LABS (all labs ordered are listed, but only abnormal results are displayed)  Labs Reviewed  COMPREHENSIVE METABOLIC PANEL - Abnormal; Notable for the following components:      Result Value   Potassium 3.2 (*)    All other components within normal limits  CBC WITH DIFFERENTIAL/PLATELET - Abnormal; Notable for the following  components:   Neutro Abs 8.8 (*)    All other components within normal limits  CK - Abnormal; Notable for the following components:   Total CK 277 (*)    All other components within normal limits  URINALYSIS, COMPLETE (UACMP) WITH MICROSCOPIC - Abnormal; Notable for the following components:   Color, Urine AMBER (*)    APPearance CLOUDY (*)    Hgb urine dipstick MODERATE (*)    Ketones, ur 80 (*)    Protein, ur 30 (*)    Leukocytes, UA MODERATE (*)    All other components within normal limits  TROPONIN I  PREGNANCY, URINE   ____________________________________________  EKG EKG read interpreted by me showed sinus tachycardia rate of 102 normal axis scattered PR segment depression no chest pain.  Troponin negative this is likely non-specific  ____________________________________________  RADIOLOGY  ED MD interpretation:   Official radiology report(s): No results found.  ____________________________________________   PROCEDURES  Procedure(s) performed:   Procedures  Critical Care performed:   ____________________________________________   INITIAL IMPRESSION / ASSESSMENT AND PLAN / ED COURSE Patient has some red cells and white cells in her urine but a lot of epithelial cells.  She has no dysuria urgency or frequency.  I will not treat this.  Social worker saw the patient.  She gave her a number of resources for follow-up.  Patient will have friends help her get her to her destination.      ____________________________________________   FINAL CLINICAL IMPRESSION(S) / ED DIAGNOSES  Final diagnoses:  Dehydration     ED Discharge Orders    None       Note:  This document was prepared using Dragon voice recognition software and may include unintentional dictation errors.    Arnaldo NatalMalinda, Paul F, MD 01/26/18 [phone]

## 2018-01-26 NOTE — ED Notes (Signed)
Awaiting fluids to complete before dr discharges pt

## 2018-01-26 NOTE — ED Triage Notes (Signed)
[place] walking from [place] and got to [place] and felt faint and diaphoretic. Only had approx 8oz of water while walking.

## 2018-01-26 NOTE — ED Notes (Signed)
Pt given lunch tray.
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
## 2018-05-25 ENCOUNTER — Emergency Department: Payer: Medicaid Other

## 2018-05-25 ENCOUNTER — Other Ambulatory Visit: Payer: Self-pay

## 2018-05-25 ENCOUNTER — Emergency Department
Admission: EM | Admit: 2018-05-25 | Discharge: 2018-05-25 | Disposition: A | Discharge status: Home / Self Care | Payer: Medicaid Other | Attending: Emergency Medicine | Admitting: Emergency Medicine

## 2018-05-25 ENCOUNTER — Encounter: Payer: Self-pay | Admitting: Emergency Medicine

## 2018-05-25 DIAGNOSIS — M25562 Pain in left knee: Secondary | ICD-10-CM | POA: Insufficient documentation

## 2018-05-25 DIAGNOSIS — F17210 Nicotine dependence, cigarettes, uncomplicated: Secondary | ICD-10-CM | POA: Insufficient documentation

## 2018-05-25 MED ORDER — MELOXICAM 7.5 MG PO TABS
15.0000 mg | ORAL_TABLET | Freq: Once | ORAL | Status: AC
  Administered 2018-05-26: 15 mg via ORAL

## 2018-05-25 MED ORDER — MELOXICAM 15 MG PO TABS: 15.0000 mg | ORAL_TABLET | Freq: Every day | ORAL | 1 refills | Status: AC

## 2018-05-25 NOTE — ED Provider Notes (Signed)
[facility] Emergency Department Provider Note  ____________________________________________  Time seen: Approximately 11:55 PM  I have reviewed the triage vital signs and the nursing notes.   HISTORY  Chief Complaint Knee Pain    HPI Deborah Lambert is a 32 y.o. female presents to the emergency department with 7 out of 10 aching left knee pain.  Patient has been experiencing left knee pain for the past several weeks but today she was standing in her kitchen and felt like her kneecap "shifted to the outside".  Patient denies a prior history of subluxing patella.  She denies numbness or tingling in the left lower extremity.  No recent falls or traumas.  Patient has been taking Tylenol for pain.   Past Medical History:  Diagnosis Date  . Anxiety   . Bell's palsy   . Cluster headaches   . Focal seizures (HCC)   . PTSD (post-traumatic stress disorder)   . Seizures (HCC)     There are no active problems to display for this patient.   Past Surgical History:  Procedure Laterality Date  . right arm surgery      Prior to Admission medications   Medication Sig Start Date End Date Taking? Authorizing Provider  meloxicam (MOBIC) 15 MG tablet Take 1 tablet (15 mg total) by mouth daily for 7 days. 05/25/18 06/01/18  Orvil Feil, PA-C    Allergies Aspirin; Codeine; Almond oil; and Coconut oil  History reviewed. No pertinent family history.  Social History Social History   Tobacco Use  . Smoking status: Current Every Day Smoker    Types: Cigarettes  . Smokeless tobacco: Never Used  Substance Use Topics  . Alcohol use: No  . Drug use: No     Review of Systems  Constitutional: No fever/chills Eyes: No visual changes. No discharge ENT: No upper respiratory complaints. Cardiovascular: no chest pain. Respiratory: no cough. No SOB. Gastrointestinal: No abdominal pain.  No nausea, no vomiting.  No diarrhea.  No constipation. Musculoskeletal:  Patient has left knee pain.  Skin: Negative for rash, abrasions, lacerations, ecchymosis. Neurological: Negative for headaches, focal weakness or numbness.   ____________________________________________   PHYSICAL EXAM:  VITAL SIGNS: ED Triage Vitals  Enc Vitals Group     BP 05/25/18 2055 122/77     Pulse Rate 05/25/18 2055 86     Resp 05/25/18 2055 18     Temp 05/25/18 2055 97.7 F (36.5 C)     Temp Source 05/25/18 2055 Oral     SpO2 05/25/18 2055 100 %     Weight 05/25/18 2055 135 lb (61.2 kg)     Height 05/25/18 2055 5\' 5"  (1.651 m)     Head Circumference --      Peak Flow --      Pain Score 05/25/18 2110 10     Pain Loc --      Pain Edu? --      Excl. in GC? --      Constitutional: Alert and oriented. Well appearing and in no acute distress. Eyes: Conjunctivae are normal. PERRL. EOMI. Head: Atraumatic. Cardiovascular: Normal rate, regular rhythm. Normal S1 and S2.  Good peripheral circulation. Respiratory: Normal respiratory effort without tachypnea or retractions. Lungs CTAB. Good air entry to the bases with no decreased or absent breath sounds. Musculoskeletal: Patient has 5 out of 5 strength in the upper and lower extremities bilaterally and symmetrically.  Patient has difficulty performing extension of the left knee.  She has tenderness  with palpation over the medial joint line and has a positive apprehension test.  Other provocative testing is limited due to patient's pain.  No popliteal cyst palpated.  Palpable dorsalis pedis pulse, left. Neurologic:  Normal speech and language. No gross focal neurologic deficits are appreciated.  Skin:  Skin is warm, dry and intact. No rash noted. Psychiatric: Mood and affect are normal. Speech and behavior are normal. Patient exhibits appropriate insight and judgement.   ____________________________________________   LABS (all labs ordered are listed, but only abnormal results are displayed)  Labs Reviewed - No data to  display ____________________________________________  EKG   ____________________________________________  RADIOLOGY I personally viewed and evaluated these images as part of my medical decision making, as well as reviewing the written report by the radiologist.  Dg Knee Complete 4 Views Left  Result Date: 05/25/2018 CLINICAL DATA:  Left knee pain.  No known injury. EXAM: LEFT KNEE - COMPLETE 4+ VIEW COMPARISON:  Radiographs 09/14/2012 FINDINGS: No evidence of fracture, dislocation, or joint effusion. No evidence of arthropathy or other focal bone abnormality. Soft tissues are unremarkable. IMPRESSION: Negative radiographs of the left ankle. Electronically Signed   By: Narda RutherfordMelanie  Sanford M.D.   On: 05/25/2018 22:14    ____________________________________________    PROCEDURES  Procedure(s) performed:    Procedures    Medications  meloxicam (MOBIC) tablet 15 mg (15 mg Oral Given 05/26/18 0015)     ____________________________________________   INITIAL IMPRESSION / ASSESSMENT AND PLAN / ED COURSE  Pertinent labs & imaging results that were available during my care of the patient were reviewed by me and considered in my medical decision making (see chart for details).  Review of the  [place]RS was performed in accordance of the [place] prior to dispensing any controlled drugs.      Assessment and plan Left knee pain Patient presents to the emergency department with worsening left knee pain.  X-ray examination revealed no acute bony abnormality.  Left knee pain was reproduced with palpation of the medial joint line and with apprehension test.  Compression and ice were recommended.  I also talked with patient about strengthening the vastus medialis oblique as patient's pain improves.  Patient was started on meloxicam daily and was advised to follow-up with orthopedics.  Vital signs are reassuring prior to discharge.  All patient questions were  answered.    ____________________________________________  FINAL CLINICAL IMPRESSION(S) / ED DIAGNOSES  Final diagnoses:  Acute pain of left knee      NEW MEDICATIONS STARTED DURING THIS VISIT:  ED Discharge Orders         Ordered    meloxicam (MOBIC) 15 MG tablet  Daily     05/25/18 2247              This chart was dictated using voice recognition software/Dragon. Despite best efforts to proofread, errors can occur which can change the meaning. Any change was purely unintentional.    Orvil FeilWoods, Orlean Holtrop M, PA-C 05/25/18 2359    Don PerkingVeronese, [place], MD 05/26/18 0000

## 2018-05-25 NOTE — ED Triage Notes (Signed)
Pt reports left knee pain for several weeks, no known injury; tonight she was standing in her kitchen and "felt something shift" and now has increased pain and burning to knee; pt says about 5 years ago she fractured her patella and had "fluid pockets" that had to be drained;

## 2018-05-26 MED ORDER — MELOXICAM 7.5 MG PO TABS
ORAL_TABLET | ORAL | Status: AC
  Administered 2018-05-26: 15 mg via ORAL
  Filled 2018-05-26: qty 2

## 2018-07-20 ENCOUNTER — Other Ambulatory Visit: Payer: Self-pay

## 2018-07-20 ENCOUNTER — Emergency Department
Admission: EM | Admit: 2018-07-20 | Discharge: 2018-07-20 | Discharge status: Against Medical Advice | Payer: Medicaid Other | Attending: Emergency Medicine | Admitting: Emergency Medicine

## 2018-07-20 ENCOUNTER — Encounter: Payer: Self-pay | Admitting: Emergency Medicine

## 2018-07-20 DIAGNOSIS — Z5321 Procedure and treatment not carried out due to patient leaving prior to being seen by health care provider: Secondary | ICD-10-CM | POA: Insufficient documentation

## 2018-07-20 DIAGNOSIS — R339 Retention of urine, unspecified: Secondary | ICD-10-CM | POA: Insufficient documentation

## 2018-07-20 LAB — URINALYSIS, COMPLETE (UACMP) WITH MICROSCOPIC
BACTERIA UA: NONE SEEN
Bilirubin Urine: NEGATIVE
Glucose, UA: NEGATIVE mg/dL
Ketones, ur: NEGATIVE mg/dL
Nitrite: NEGATIVE
Protein, ur: NEGATIVE mg/dL
Specific Gravity, Urine: 1.018 (ref 1.005–1.030)
pH: 6 (ref 5.0–8.0)

## 2018-07-20 LAB — POCT PREGNANCY, URINE: PREG TEST UR: NEGATIVE

## 2018-07-20 NOTE — ED Triage Notes (Signed)
Pt arrives POV and ambulatory to triage with c/o urinary retention x 5 hours. Pt states that she has had pain while urinating. Pt is in NAD.

## 2018-07-20 NOTE — ED Notes (Signed)
Bladder scan done by this tech. Rosalene Billings RN viewed in the room. 247 ml found. RN notified.

## 2019-06-01 ENCOUNTER — Other Ambulatory Visit: Payer: Self-pay

## 2019-06-01 ENCOUNTER — Emergency Department (HOSPITAL_COMMUNITY): Payer: Self-pay

## 2019-06-01 ENCOUNTER — Encounter (HOSPITAL_COMMUNITY): Payer: Self-pay | Admitting: Emergency Medicine

## 2019-06-01 ENCOUNTER — Emergency Department (HOSPITAL_COMMUNITY)
Admission: EM | Admit: 2019-06-01 | Discharge: 2019-06-01 | Disposition: A | Discharge status: Home / Self Care | Payer: Self-pay | Attending: Emergency Medicine | Admitting: Emergency Medicine

## 2019-06-01 DIAGNOSIS — R079 Chest pain, unspecified: Secondary | ICD-10-CM

## 2019-06-01 DIAGNOSIS — R0789 Other chest pain: Secondary | ICD-10-CM | POA: Insufficient documentation

## 2019-06-01 DIAGNOSIS — F17210 Nicotine dependence, cigarettes, uncomplicated: Secondary | ICD-10-CM | POA: Insufficient documentation

## 2019-06-01 LAB — BASIC METABOLIC PANEL
Anion gap: 11 (ref 5–15)
BUN: 13 mg/dL (ref 6–20)
CO2: 17 mmol/L — ABNORMAL LOW (ref 22–32)
Calcium: 9.4 mg/dL (ref 8.9–10.3)
Chloride: 110 mmol/L (ref 98–111)
Creatinine, Ser: 0.95 mg/dL (ref 0.44–1.00)
GFR calc Af Amer: >60 mL/min (ref >60)
GFR calc non Af Amer: >60 mL/min (ref >60)
Glucose, Bld: 103 mg/dL — ABNORMAL HIGH (ref 70–99)
Potassium: 3.4 mmol/L — ABNORMAL LOW (ref 3.5–5.1)
Sodium: 138 mmol/L (ref 135–145)

## 2019-06-01 LAB — CBC
HCT: 41.9 % (ref 36.0–46.0)
Hemoglobin: 14.3 g/dL (ref 12.0–15.0)
MCH: 29.7 pg (ref 26.0–34.0)
MCHC: 34.1 g/dL (ref 30.0–36.0)
MCV: 86.9 fL (ref 80.0–100.0)
Platelets: 221 10*3/uL (ref 150–400)
RBC: 4.82 MIL/uL (ref 3.87–5.11)
RDW: 11.8 % (ref 11.5–15.5)
WBC: 8.6 10*3/uL (ref 4.0–10.5)
nRBC: 0 % (ref 0.0–0.2)

## 2019-06-01 LAB — TROPONIN I (HIGH SENSITIVITY)
Troponin I (High Sensitivity): <2 ng/L (ref <18)
Troponin I (High Sensitivity): <2 ng/L (ref <18)

## 2019-06-01 LAB — D-DIMER, QUANTITATIVE: D-Dimer, Quant: 0.78 ug/mL-FEU — ABNORMAL HIGH (ref 0.00–0.50)

## 2019-06-01 MED ORDER — OMEPRAZOLE 20 MG PO CPDR: 20.0000 mg | DELAYED_RELEASE_CAPSULE | Freq: Every day | ORAL | 0 refills | Status: DC

## 2019-06-01 MED ORDER — IOHEXOL 350 MG/ML SOLN
100.0000 mL | Freq: Once | INTRAVENOUS | Status: AC | PRN
  Administered 2019-06-01: 100 mL via INTRAVENOUS

## 2019-06-01 MED ORDER — SODIUM CHLORIDE 0.9% FLUSH
3.0000 mL | Freq: Once | INTRAVENOUS | Status: AC
  Administered 2019-06-01: 3 mL via INTRAVENOUS

## 2019-06-01 MED ORDER — ALUM & MAG HYDROXIDE-SIMETH 200-200-20 MG/5ML PO SUSP
30.0000 mL | Freq: Once | ORAL | Status: AC
  Administered 2019-06-01: 30 mL via ORAL
  Filled 2019-06-01: qty 30

## 2019-06-01 MED ORDER — LIDOCAINE VISCOUS HCL 2 % MT SOLN
15.0000 mL | Freq: Once | OROMUCOSAL | Status: AC
  Administered 2019-06-01: 15 mL via ORAL
  Filled 2019-06-01: qty 15

## 2019-06-01 NOTE — ED Notes (Signed)
IV placed by EMS in [place] was accidentally removed by pt. Area was cleaned and and dressed with gauze.

## 2019-06-01 NOTE — ED Triage Notes (Signed)
Pt arrives via EMS on [address], after eating lunch began felling numb allover, pt hyperventilating, unable to move anything. C/o sudden onset substernal CP 10/10, 12 lead unremarkable. VSS. cbg 111. 20g LFA.1 nitro no relief. No ASA due to allergy.

## 2019-06-01 NOTE — ED Provider Notes (Signed)
[facility] EMERGENCY DEPARTMENT Provider Note   CSN: [id number] Arrival date & time: 06/01/19  1300     History   Chief Complaint Chief Complaint  Patient presents with  . Chest Pain    HPI Deborah Lambert is a 33 y.o. female with PMHx PTSD and anxiety who presents to the ED today complaining of sudden onset, intermittent, stabbing, substernal chest pain/epigastric pain x 5 days, worsening today. Pt also complains of "numbness" to her LUE that has since resolved. Patient reports she has been having intermittent chest pain for quite some time, she reports she saw cardiology earlier this year and had a negative stress test.  Patient states they were unable to tell her what was causing her chest pain.  Per chart review she was advised to follow-up with PCP for further evaluation, patient does not currently have a PCP.  Dates that she has been "ignoring" her chest pains until became so severe today that she had to call 911.  Patient states that the pain is worse with deep inspiration, movement, and eating.  She states that she vomited today after her chest pain became so severe and vomited again in the waiting room.  She denies history of acid reflux.  States that she has taken Tums in the past for heartburn but did not help her symptoms but states that this feels different.  No recent prolonged travel or immobilization.  No history DVT/PE.  No exogenous hormone use.  No active malignancy.  No hemoptysis.  Denies fever, chills, leg swelling, palpitations, diaphoresis, any other associated symptoms.       Past Medical History:  Diagnosis Date  . Anxiety   . Bell's palsy   . Cluster headaches   . Focal seizures (HCC)   . PTSD (post-traumatic stress disorder)   . Seizures (HCC)     There are no active problems to display for this patient.   Past Surgical History:  Procedure Laterality Date  . right arm surgery       OB History   No obstetric history on file.       Home Medications    Prior to Admission medications   Medication Sig Start Date End Date Taking? Authorizing Provider  omeprazole (PRILOSEC) 20 MG capsule Take 1 capsule (20 mg total) by mouth daily. 06/01/19 07/01/19  Tanda Rockers, PA-C    Family History History reviewed. No pertinent family history.  Social History Social History   Tobacco Use  . Smoking status: Current Every Day Smoker    Types: Cigarettes  . Smokeless tobacco: Never Used  Substance Use Topics  . Alcohol use: No  . Drug use: No     Allergies   Aspirin, Codeine, Almond oil, and Coconut oil   Review of Systems Review of Systems  Constitutional: Negative for chills, diaphoresis and fever.  HENT: Negative for congestion.   Eyes: Negative for visual disturbance.  Respiratory: Positive for shortness of breath.   Cardiovascular: Positive for chest pain. Negative for palpitations and leg swelling.  Gastrointestinal: Positive for nausea and vomiting. Negative for abdominal pain.  Genitourinary: Negative for difficulty urinating.  Musculoskeletal: Negative for myalgias.  Skin: Negative for rash.  Neurological: Positive for numbness. Negative for weakness and headaches.     Physical Exam Updated Vital Signs BP (!) 132/91 (BP Location: Left Arm)   Pulse (!) 53   Temp 98.5 F (36.9 C) (Oral)   Resp 17   Ht 5' 4.5" (1.638 m)  Wt 68 kg   SpO2 99%   BMI 25.35 kg/m   Physical Exam Vitals signs and nursing note reviewed.  Constitutional:      Appearance: She is not ill-appearing or diaphoretic.  HENT:     Head: Normocephalic and atraumatic.  Eyes:     Extraocular Movements: Extraocular movements intact.     Conjunctiva/sclera: Conjunctivae normal.     Pupils: Pupils are equal, round, and reactive to light.  Neck:     Musculoskeletal: Neck supple.  Cardiovascular:     Rate and Rhythm: Normal rate and regular rhythm.  Pulmonary:     Effort: Pulmonary effort is normal.     Breath sounds:  Normal breath sounds. No decreased breath sounds, wheezing, rhonchi or rales.  Chest:     Chest wall: Tenderness present.     Comments: + substernal chest wall TTP Abdominal:     Palpations: Abdomen is soft.     Tenderness: There is abdominal tenderness. There is no guarding or rebound.     Comments: Soft, + epigastric TTP, +BS throughout, no r/g/r, neg murphy's, neg mcburney's, no CVA TTP  Skin:    General: Skin is warm and dry.  Neurological:     Mental Status: She is alert.     Comments: CN 3-12 grossly intact A&O x4 GCS 15 Sensation and strength intact Coordination with finger-to-nose WNL Neg romberg, neg pronator drift      ED Treatments / Results  Labs (all labs ordered are listed, but only abnormal results are displayed) Labs Reviewed  BASIC METABOLIC PANEL - Abnormal; Notable for the following components:      Result Value   Potassium 3.4 (*)    CO2 17 (*)    Glucose, Bld 103 (*)    All other components within normal limits  D-DIMER, QUANTITATIVE (NOT AT [facility]) - Abnormal; Notable for the following components:   D-Dimer, Quant 0.78 (*)    All other components within normal limits  CBC  I-STAT BETA HCG BLOOD, ED ([facility], [facility], [facility] ONLY)  TROPONIN I (HIGH SENSITIVITY)  TROPONIN I (HIGH SENSITIVITY)    EKG None  Radiology Dg Chest 2 View  Result Date: 06/01/2019 CLINICAL DATA:  Chest pain EXAM: CHEST - 2 VIEW COMPARISON:  01/10/2016 FINDINGS: Lungs are clear.  No pleural effusion or pneumothorax. The heart is normal in size. Visualized osseous structures are within normal limits. IMPRESSION: Normal chest radiographs. Electronically Signed   By: Charline BillsSriyesh  Krishnan M.D.   On: 06/01/2019 13:53   Ct Angio Chest Pe W/cm &/or Wo Cm  Result Date: 06/01/2019 CLINICAL DATA:  Hyperventilating EXAM: CT ANGIOGRAPHY CHEST WITH CONTRAST TECHNIQUE: Multidetector CT imaging of the chest was performed using the standard protocol during bolus administration of intravenous contrast.  Multiplanar CT image reconstructions and MIPs were obtained to evaluate the vascular anatomy. CONTRAST:  100mL OMNIPAQUE IOHEXOL 350 MG/ML SOLN COMPARISON:  Radiograph same day FINDINGS: Cardiovascular: There is a optimal opacification of the pulmonary arteries. There is no central,segmental, or subsegmental filling defects within the pulmonary arteries. The heart is normal in size. No pericardial effusion or thickening. No evidence right heart strain. There is normal three-vessel brachiocephalic anatomy without proximal stenosis. The thoracic aorta is normal in appearance. Mediastinum/Nodes: No hilar, mediastinal, or axillary adenopathy. Thyroid gland, trachea, and esophagus demonstrate no significant findings. Lungs/Pleura: Minimal bibasilar dependent atelectasis is seen. No large airspace consolidation or focal abnormality. No pleural effusion or pneumothorax. No airspace consolidation. Upper Abdomen: No acute abnormalities present in the  visualized portions of the upper abdomen. Musculoskeletal: No chest wall abnormality. No acute or significant osseous findings. Review of the MIP images confirms the above findings. IMPRESSION: No central, segmental, or subsegmental pulmonary embolism. No acute intrathoracic pathology to explain the patient's symptoms. Electronically Signed   By: Jonna Clark M.D.   On: 06/01/2019 19:05    Procedures Procedures (including critical care time)  Medications Ordered in ED Medications  sodium chloride flush (NS) 0.9 % injection 3 mL (3 mLs Intravenous Given 06/01/19 1617)  alum & mag hydroxide-simeth (MAALOX/MYLANTA) 200-200-20 MG/5ML suspension 30 mL (30 mLs Oral Given 06/01/19 1613)    And  lidocaine (XYLOCAINE) 2 % viscous mouth solution 15 mL (15 mLs Oral Given 06/01/19 1614)  iohexol (OMNIPAQUE) 350 MG/ML injection 100 mL (100 mLs Intravenous Contrast Given 06/01/19 1833)     Initial Impression / Assessment and Plan / ED Course  I have reviewed the triage vital  signs and the nursing notes.  Pertinent labs & imaging results that were available during my care of the patient were reviewed by me and considered in my medical decision making (see chart for details).  33 year old female presents the ED with chest pain.  3 of same and has seen cardiology recently in the past few months with negative stress test.  Reports pain is worse after eating, strong suspicion for GERD versus peptic ulcer disease although will rule out ACS.  Patient is an active smoker and she does have a family history of CAD prior to the age of 79.  Given she is complaining of pleuritic chest pain as well will obtain a D-dimer although otherwise patient is PERC negative for PE.  She was not given aspirin in route by EMS due to allergy listed as hives.  Was given 1 sublingual nitro without relief.  Try GI cocktail in the meantime.  Lab work was obtained prior to being seen.  Chest x-ray negative.  EKG with sinus tachycardia in the 100s although during my exam patient's heart rate in the next days.  Troponin less than 2.  No leukocytosis.  Hemoglobin stable.   D dimer elevated; will proceed with CTA.   Repeat trop < 2. CTA negative for PE. Pt feels improved after GI cocktail.  Prescribed omeprazole for GERD.  Patient advised to follow-up with PCP.  Info given for [facility] that she does not currently have a PCP.  Patient may need endoscopy to rule out peptic ulcer disease.  She is also requesting OB/GYN referral as she reports irregular menstrual cycles for the past year.  Will give name of woman's clinic.  Patient advised that she can follow-up with her cardiologist if she continues to have pain although very strong suspicion for acid reflux versus peptic ulcer disease today.  Strict return precautions have been discussed with patient.  She is in agreement with plan at this time stable for discharge home.  This note was prepared using Dragon voice recognition software and may  include unintentional dictation errors due to the inherent limitations of voice recognition software.   Clinical Course as of May 31 1957  Mon Jun 01, 2019  1746 D-Dimer, Quant(!): 0.78 [MV]    Clinical Course User Index [MV] Tanda Rockers, [place]         Final Clinical Impressions(s) / ED Diagnoses   Final diagnoses:  Nonspecific chest pain    ED Discharge Orders         Ordered    omeprazole (PRILOSEC) 20  MG capsule  Daily     06/01/19 1955           Eustaquio Maize, [place] 06/01/19 1958    Tegeler, Gwenyth Allegra, MD 06/01/19 2255

## 2019-06-01 NOTE — Discharge Instructions (Addendum)
Your labwork, EKG, chest xray, and CT scan of your chest were all very reassuring Please take medication as prescribed - your chest pain may be related to acid reflux  Follow up with your PCP. If you do not have one you can follow up with [facility] for your primary care needs  Follow up with OBGYN regarding your irregular menstrual cycle

## 2019-07-31 ENCOUNTER — Ambulatory Visit
Admission: EM | Admit: 2019-07-31 | Discharge: 2019-07-31 | Disposition: A | Discharge status: Home / Self Care | Payer: No Typology Code available for payment source | Attending: Family Medicine | Admitting: Family Medicine

## 2019-07-31 ENCOUNTER — Ambulatory Visit: Payer: No Typology Code available for payment source

## 2019-07-31 ENCOUNTER — Encounter: Payer: Self-pay | Admitting: Emergency Medicine

## 2019-07-31 ENCOUNTER — Other Ambulatory Visit: Payer: Self-pay

## 2019-07-31 DIAGNOSIS — F17210 Nicotine dependence, cigarettes, uncomplicated: Secondary | ICD-10-CM | POA: Diagnosis not present

## 2019-07-31 DIAGNOSIS — M25562 Pain in left knee: Secondary | ICD-10-CM | POA: Insufficient documentation

## 2019-07-31 DIAGNOSIS — Z7952 Long term (current) use of systemic steroids: Secondary | ICD-10-CM | POA: Diagnosis not present

## 2019-07-31 DIAGNOSIS — S60021A Contusion of right index finger without damage to nail, initial encounter: Secondary | ICD-10-CM | POA: Diagnosis not present

## 2019-07-31 DIAGNOSIS — M542 Cervicalgia: Secondary | ICD-10-CM | POA: Diagnosis not present

## 2019-07-31 DIAGNOSIS — M545 Low back pain: Secondary | ICD-10-CM

## 2019-07-31 DIAGNOSIS — M549 Dorsalgia, unspecified: Secondary | ICD-10-CM | POA: Diagnosis present

## 2019-07-31 MED ORDER — PREDNISONE 20 MG PO TABS: 40.0000 mg | ORAL_TABLET | Freq: Every day | ORAL | 0 refills | Status: DC

## 2019-07-31 MED ORDER — CYCLOBENZAPRINE HCL 10 MG PO TABS: 10.0000 mg | ORAL_TABLET | Freq: Two times a day (BID) | ORAL | 0 refills | Status: DC | PRN

## 2019-07-31 MED ORDER — TRAMADOL HCL 50 MG PO TABS: 50.0000 mg | ORAL_TABLET | Freq: Three times a day (TID) | ORAL | 0 refills | Status: DC | PRN
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
## 2019-07-31 NOTE — Discharge Instructions (Signed)
Take medication as prescribed. Rest. Drink plenty of fluids.  Apply ice.  Rest.  Use brace.  Follow-up with orthopedic this week for continued pain, as discussed.  See above to call.  Follow up with your primary care physician this week as needed. Return to Urgent care for new or worsening concerns.

## 2020-01-02 ENCOUNTER — Ambulatory Visit
Admission: EM | Admit: 2020-01-02 | Discharge: 2020-01-02 | Disposition: A | Discharge status: Home / Self Care | Payer: Medicaid Other | Attending: Emergency Medicine | Admitting: Emergency Medicine

## 2020-01-02 ENCOUNTER — Encounter: Payer: Self-pay | Admitting: Emergency Medicine

## 2020-01-02 ENCOUNTER — Other Ambulatory Visit: Payer: Self-pay

## 2020-01-02 DIAGNOSIS — S61411A Laceration without foreign body of right hand, initial encounter: Secondary | ICD-10-CM

## 2020-01-02 MED ORDER — IBUPROFEN 800 MG PO TABS: 800.0000 mg | ORAL_TABLET | Freq: Three times a day (TID) | ORAL | 0 refills | Status: DC

## 2020-01-02 NOTE — Discharge Instructions (Signed)
You were seen for a hand laceration.   Your laceration was closed with Dermabond.  Keep your hand still as possible until you are able to set up an appoint with a hand specialist.  You can take the wrap off your hand in about 24 hours and leave it open to air as long as you keep your hand still.  Use your prescription ibuprofen for pain as needed.  Make sure you take it with food.  Ice to the area will also help.  Take care, Dr. Sharlet Salina, NP-c
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
## 2020-01-02 NOTE — ED Provider Notes (Signed)
[facility] [place]   Name: Deborah Lambert DOB: 03-15-1986 MRN: [id number] CSN: [id number] PCP: Patient, No Pcp Per  Arrival date and time:  01/02/20 1407  Chief Complaint:  Extremity Laceration (right hand)   NOTE: Prior to seeing the patient today, I have reviewed the triage nursing documentation and vital signs. Clinical staff has updated patient's PMH/PSHx, current medication list, and drug allergies/intolerances to ensure comprehensive history available to assist in medical decision making.   History:   HPI: Deborah Lambert is a 34 y.o. female who presents today with complaints of laceration to her right hand.  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  Afterwards, she did not notice the injury, but it was immediately pointed out to her by her husband.  She presented to the urgent care shortly afterwards.  Patient has no previous injury to that hand.  She is right-handed.  She states she is unable to feel her right middle finger and there is increased pain of her hand and wrist when gripping.   Past Medical History:  Diagnosis Date   Anxiety    Bell's palsy    Cluster headaches    Focal seizures (HCC)    PTSD (post-traumatic stress disorder)    Seizures (HCC)     Past Surgical History:  Procedure Laterality Date   right arm surgery      Family History  Problem Relation Age of Onset   Healthy Mother    Healthy Father     Social History   Tobacco Use   Smoking status: Current Every Day Smoker    Types: Cigarettes   Smokeless tobacco: Never Used  Vaping Use   Vaping Use: Never used  Substance Use Topics   Alcohol use: No   Drug use: No    There are no problems to display for this patient.   Home Medications:    Current Meds  Medication Sig   albuterol (VENTOLIN HFA) 108 (90 Base) MCG/ACT inhaler SMARTSIG:1-2 Puff(s) By Mouth Every 4-6 Hours PRN     cyclobenzaprine (FLEXERIL) 10 MG tablet Take 1 tablet (10 mg total) by mouth 2 (two) times daily as needed for muscle spasms. Do not drive while taking as can cause drowsiness   traMADol (ULTRAM) 50 MG tablet Take 1 tablet (50 mg total) by mouth every 8 (eight) hours as needed.    Allergies:   Aspirin, Codeine, Almond oil, and Coconut oil  Review of Systems (ROS): Review of Systems  Skin: Positive for wound.  All other systems reviewed and are negative.    Vital Signs: Today's Vitals   01/02/20 1424 01/02/20 1428 01/02/20 1513  BP:  105/78   Pulse:  81   Resp:  14   Temp:  98.1 F (36.7 C)   TempSrc:  Oral   SpO2:  100%   Weight: 170 lb (77.1 kg)    Height: 5' 4.5" (1.638 m)    PainSc: 10-Worst pain ever  9     Physical Exam: Physical Exam Vitals and nursing note reviewed.  Constitutional:      Appearance: Normal appearance.  Cardiovascular:     Rate and Rhythm: Normal rate and regular rhythm.     Pulses: Normal pulses.     Heart sounds: Normal heart sounds.  Pulmonary:     Breath sounds: Normal breath sounds.  Skin:    General: Skin  is warm and dry.     Comments: 1 cm long partial-thickness laceration to dorsal surface of hand, near third metacarpal.  There is actively bleeding, bleeding is controlled.   Neurological:     Mental Status: She is alert.     Sensory: Sensory deficit present.     Comments: Unable to feel middle finger.  Decreased grip ability.  Psychiatric:        Mood and Affect: Mood normal.        Behavior: Behavior normal.        Thought Content: Thought content normal.      Urgent Care Treatments / Results:   LABS: PLEASE NOTE: all labs that were ordered this encounter are listed, however only abnormal results are displayed. Labs Reviewed - No data to display  EKG: -None  RADIOLOGY: No results found.  PROCEDURES: Laceration Repair  Date/Time: 01/02/2020 5:34 PM Performed by: Bailey Mech, NP Authorized by: Bailey Mech, NP   Consent:    Consent obtained:  Verbal   Consent given by:  Patient   Risks discussed:  Poor wound healing   Alternatives discussed:  No treatment Anesthesia (see MAR for exact dosages):    Anesthesia method:  None Laceration details:    Location:  Hand   Hand location:  R hand, dorsum   Length (cm):  1 Repair type:    Repair type:  Simple Exploration:    Hemostasis achieved with:  Direct pressure   Wound extent: fascia violated     Wound extent comment:  Possible nerve and tendon damage   Contaminated: no   Treatment:    Area cleansed with:  Betadine and Shur-Clens   Amount of cleaning:  Standard   Irrigation solution:  Sterile saline   Visualized foreign bodies/material removed: no   Skin repair:    Repair method:  Tissue adhesive Approximation:    Approximation:  Close Post-procedure details:    Dressing:  Adhesive bandage   Patient tolerance of procedure:  Tolerated well, no immediate complications    MEDICATIONS RECEIVED THIS VISIT: Medications - No data to display  PERTINENT CLINICAL COURSE NOTES/UPDATES:   Initial Impression / Assessment and Plan / Urgent Care Course:  Pertinent labs & imaging results that were available during my care of the patient were personally reviewed by me and considered in my medical decision making (see lab/imaging section of note for values and interpretations).  Deborah Lambert is a 34 y.o. female who presents to [facility] today with complaints of laceration, diagnosed with the same, and treated as such with the procedure above. NP and patient reviewed discharge instructions below during visit.   Patient is well appearing overall in clinic today. She does not appear to be in any acute distress. Presenting symptoms (see HPI) and exam as documented above.   I have reviewed the follow up and strict return precautions for any new or worsening symptoms. Patient is aware of symptoms that would be deemed  urgent/emergent, and would thus require further evaluation either here or in the emergency department. At the time of discharge, she verbalized understanding and consent with the discharge plan as it was reviewed with her. All questions were fielded by provider and/or clinic staff prior to patient discharge.    Final Clinical Impressions / Urgent Care Diagnoses:   Final diagnoses:  Laceration of right hand, foreign body presence unspecified, initial encounter    New Prescriptions:  [place] Controlled Substance Registry consulted? Not Applicable  Meds ordered  this encounter  Medications   ibuprofen (ADVIL) 800 MG tablet    Sig: Take 1 tablet (800 mg total) by mouth 3 (three) times daily.    Dispense:  21 tablet    Refill:  0      Discharge Instructions     You were seen for a hand laceration.   Your laceration was closed with Dermabond.  Keep your hand still as possible until you are able to set up an appoint with a hand specialist.  You can take the wrap off your hand in about 24 hours and leave it open to air as long as you keep your hand still.  Use your prescription ibuprofen for pain as needed.  Make sure you take it with food.  Ice to the area will also help.  Take care, Dr. Sharlet Salina, NP-c     Recommended Follow up Care:  Patient encouraged to follow up with the following provider within the specified time frame, or sooner as dictated by the severity of her symptoms. As always, she was instructed that for any urgent/emergent care needs, she should seek care either here or in the emergency department for more immediate evaluation.   Bailey Mech, DNP, NP-c    Bailey Mech, NP 01/02/20 1736

## 2021-02-07 ENCOUNTER — Encounter: Payer: Self-pay | Admitting: *Deleted

## 2021-02-07 ENCOUNTER — Emergency Department
Admission: EM | Admit: 2021-02-07 | Discharge: 2021-02-07 | Disposition: A | Discharge status: Home / Self Care | Payer: Self-pay | Attending: Emergency Medicine | Admitting: Emergency Medicine

## 2021-02-07 ENCOUNTER — Emergency Department: Payer: Self-pay

## 2021-02-07 ENCOUNTER — Other Ambulatory Visit: Payer: Self-pay

## 2021-02-07 DIAGNOSIS — R059 Cough, unspecified: Secondary | ICD-10-CM | POA: Insufficient documentation

## 2021-02-07 DIAGNOSIS — R079 Chest pain, unspecified: Secondary | ICD-10-CM

## 2021-02-07 DIAGNOSIS — R0602 Shortness of breath: Secondary | ICD-10-CM | POA: Insufficient documentation

## 2021-02-07 DIAGNOSIS — R110 Nausea: Secondary | ICD-10-CM | POA: Insufficient documentation

## 2021-02-07 DIAGNOSIS — R0789 Other chest pain: Secondary | ICD-10-CM | POA: Insufficient documentation

## 2021-02-07 DIAGNOSIS — M79602 Pain in left arm: Secondary | ICD-10-CM | POA: Insufficient documentation

## 2021-02-07 DIAGNOSIS — F17210 Nicotine dependence, cigarettes, uncomplicated: Secondary | ICD-10-CM | POA: Insufficient documentation

## 2021-02-07 LAB — CBC
HCT: 41.3 % (ref 36.0–46.0)
Hemoglobin: 13.9 g/dL (ref 12.0–15.0)
MCH: 30.1 pg (ref 26.0–34.0)
MCHC: 33.7 g/dL (ref 30.0–36.0)
MCV: 89.4 fL (ref 80.0–100.0)
Platelets: 220 10*3/uL (ref 150–400)
RBC: 4.62 MIL/uL (ref 3.87–5.11)
RDW: 12.3 % (ref 11.5–15.5)
WBC: 6.8 10*3/uL (ref 4.0–10.5)
nRBC: 0 % (ref 0.0–0.2)

## 2021-02-07 LAB — BASIC METABOLIC PANEL
Anion gap: 8 (ref 5–15)
BUN: 13 mg/dL (ref 6–20)
CO2: 23 mmol/L (ref 22–32)
Calcium: 9.4 mg/dL (ref 8.9–10.3)
Chloride: 107 mmol/L (ref 98–111)
Creatinine, Ser: 0.79 mg/dL (ref 0.44–1.00)
GFR, Estimated: >60 mL/min (ref >60)
Glucose, Bld: 110 mg/dL — ABNORMAL HIGH (ref 70–99)
Potassium: 3.8 mmol/L (ref 3.5–5.1)
Sodium: 138 mmol/L (ref 135–145)

## 2021-02-07 LAB — TROPONIN I (HIGH SENSITIVITY): Troponin I (High Sensitivity): 4 ng/L (ref <18)

## 2021-02-07 LAB — HCG, QUANTITATIVE, PREGNANCY: hCG, Beta Chain, Quant, S: <1 m[IU]/mL (ref <5)

## 2021-02-07 LAB — D-DIMER, QUANTITATIVE: D-Dimer, Quant: 0.55 ug/mL-FEU — ABNORMAL HIGH (ref 0.00–0.50)

## 2021-02-07 MED ORDER — ACETAMINOPHEN 500 MG PO TABS
1000.0000 mg | ORAL_TABLET | Freq: Once | ORAL | Status: AC
  Administered 2021-02-07: 1000 mg via ORAL
  Filled 2021-02-07: qty 2

## 2021-02-07 MED ORDER — IOHEXOL 350 MG/ML SOLN
75.0000 mL | Freq: Once | INTRAVENOUS | Status: AC | PRN
  Administered 2021-02-07: 75 mL via INTRAVENOUS

## 2021-02-07 NOTE — ED Provider Notes (Signed)
[facility] Emergency Department Provider Note  ____________________________________________   Event Date/Time   First MD Initiated Contact with Patient 02/07/21 [phone]     (approximate)  I have reviewed the triage vital signs and the nursing notes.   HISTORY  Chief Complaint Chest Pain   HPI Deborah Lambert is a 35 y.o. female past medical history of anxiety, Bell's palsy, cluster headaches, focal seizures, PTSD and previous episodes of costochondritis who presents for assessment of some left-sided chest discomfort that began yesterday.  Patient states it radiates to her left arm.  She did have some coughing and coughed up a little bit of bloody mucus.  She also endorses little shortness of breath.  No headache, earache, sore throat or vomiting.  She states she was little nauseous at one point.  She has no abdominal pain, diarrhea, urinary symptoms, back pain, rash or extremity pain.  No recent falls or injuries.  She is not taking any analgesia.  States the pain is made worse by any manipulation of her chest.         Past Medical History:  Diagnosis Date   Anxiety    Bell's palsy    Cluster headaches    Focal seizures (HCC)    PTSD (post-traumatic stress disorder)    Seizures (HCC)     There are no problems to display for this patient.   Past Surgical History:  Procedure Laterality Date   right arm surgery      Prior to Admission medications   Medication Sig Start Date End Date Taking? Authorizing Provider  albuterol (VENTOLIN HFA) 108 (90 Base) MCG/ACT inhaler SMARTSIG:1-2 Puff(s) By Mouth Every 4-6 Hours PRN 07/06/19   [provider]  cyclobenzaprine (FLEXERIL) 10 MG tablet Take 1 tablet (10 mg total) by mouth 2 (two) times daily as needed for muscle spasms. Do not drive while taking as can cause drowsiness 07/31/19   Renford Dills, NP  hydrochlorothiazide (HYDRODIURIL) 25 MG tablet hydrochlorothiazide 25 mg tablet  TAKE 1 2 (ONE  HALF) TABLET BY MOUTH ONCE DAILY    [provider]  ibuprofen (ADVIL) 800 MG tablet Take 1 tablet (800 mg total) by mouth 3 (three) times daily. 01/02/20   Bailey Mech, NP  predniSONE (DELTASONE) 20 MG tablet Take 2 tablets (40 mg total) by mouth daily. 07/31/19   Renford Dills, NP  traMADol (ULTRAM) 50 MG tablet Take 1 tablet (50 mg total) by mouth every 8 (eight) hours as needed. 07/31/19   Renford Dills, NP  omeprazole (PRILOSEC) 20 MG capsule Take 1 capsule (20 mg total) by mouth daily. 06/01/19 07/31/19  Tanda Rockers, PA-C    Allergies Aspirin, Codeine, Almond oil, and Coconut oil  Family History  Problem Relation Age of Onset   Healthy Mother    Healthy Father     Social History Social History   Tobacco Use   Smoking status: Every Day    Types: Cigarettes   Smokeless tobacco: Never  Vaping Use   Vaping Use: Never used  Substance Use Topics   Alcohol use: No   Drug use: No    Review of Systems  Review of Systems  Constitutional:  Negative for chills and fever.  HENT:  Negative for sore throat.   Eyes:  Negative for pain.  Respiratory:  Positive for cough and shortness of breath. Negative for stridor.   Cardiovascular:  Positive for chest pain.  Gastrointestinal:  Positive for nausea. Negative for vomiting.  Genitourinary:  Negative  for dysuria.  Musculoskeletal:  Negative for myalgias.  Skin:  Negative for rash.  Neurological:  Negative for seizures, loss of consciousness and headaches.  Psychiatric/Behavioral:  Negative for suicidal ideas.   All other systems reviewed and are negative.    ____________________________________________   PHYSICAL EXAM:  VITAL SIGNS: ED Triage Vitals  Enc Vitals Group     BP 02/07/21 0618 116/90     Pulse Rate 02/07/21 0618 72     Resp 02/07/21 0618 18     Temp 02/07/21 0618 98.2 F (36.8 C)     Temp Source 02/07/21 0618 Oral     SpO2 02/07/21 0618 100 %     Weight --      Height --      Head  Circumference --      Peak Flow --      Pain Score 02/07/21 0650 10     Pain Loc --      Pain Edu? --      Excl. in GC? --    Vitals:   02/07/21 0700 02/07/21 0800  BP: 106/79 103/78  Pulse: 70 60  Resp: (!) 23 11  Temp:    SpO2: 100% 97%   Physical Exam Vitals and nursing note reviewed.  Constitutional:      General: She is not in acute distress.    Appearance: She is well-developed.  HENT:     Head: Normocephalic and atraumatic.     Right Ear: External ear normal.     Left Ear: External ear normal.     Nose: Nose normal.  Eyes:     Conjunctiva/sclera: Conjunctivae normal.  Cardiovascular:     Rate and Rhythm: Normal rate and regular rhythm.     Heart sounds: No murmur heard. Pulmonary:     Effort: Pulmonary effort is normal. No respiratory distress.     Breath sounds: Normal breath sounds.  Abdominal:     Palpations: Abdomen is soft.     Tenderness: There is no abdominal tenderness.  Musculoskeletal:     Cervical back: Neck supple.  Skin:    General: Skin is warm and dry.     Capillary Refill: Capillary refill takes less than 2 seconds.  Neurological:     Mental Status: She is alert and oriented to person, place, and time.  Psychiatric:        Mood and Affect: Mood normal.    _ Patient is quite tender over her left chest.  No overlying skin changes.  2+ radial pulse. ___________________________________________   LABS (all labs ordered are listed, but only abnormal results are displayed)  Labs Reviewed  BASIC METABOLIC PANEL - Abnormal; Notable for the following components:      Result Value   Glucose, Bld 110 (*)    All other components within normal limits  D-DIMER, QUANTITATIVE - Abnormal; Notable for the following components:   D-Dimer, Quant 0.55 (*)    All other components within normal limits  CBC  HCG, QUANTITATIVE, PREGNANCY  POC URINE PREG, ED  TROPONIN I (HIGH SENSITIVITY)   ____________________________________________  EKG  Sinus  rhythm with a ventricular to 74, normal axis, unremarkable intervals without evidence of acute ischemia or significant arrhythmia. ____________________________________________  RADIOLOGY  ED MD interpretation: Chest x-ray has no focal consolidation, effusion, edema, pneumothorax or other clear acute intrathoracic process.  CTA chest has some basilar cardiomegaly without evidence of PE, effusion, pneumothorax, edema or any other clear acute thoracic process.   Official radiology report(s):  DG Chest 2 View  Result Date: 02/07/2021 CLINICAL DATA:  35 year old female with chest pain. Left extremity pain weakness and numbness. EXAM: CHEST - 2 VIEW COMPARISON:  Chest CTA 06/01/2019 and earlier. FINDINGS: PA and lateral views. Lung volumes and mediastinal contours remain normal. Visualized tracheal air column is within normal limits. Both lungs appear stable and clear. No pneumothorax or pleural effusion. No acute osseous abnormality identified. Negative visible bowel gas pattern. IMPRESSION: Negative. No cardiopulmonary abnormality. Electronically Signed   By: Odessa Fleming M.D.   On: 02/07/2021 06:54   CT Angio Chest PE W and/or Wo Contrast  Result Date: 02/07/2021 CLINICAL DATA:  Chest pain, shortness of breath, increasing since yesterday EXAM: CT ANGIOGRAPHY CHEST WITH CONTRAST TECHNIQUE: Multidetector CT imaging of the chest was performed using the standard protocol during bolus administration of intravenous contrast. Multiplanar CT image reconstructions and MIPs were obtained to evaluate the vascular anatomy. CONTRAST:  5mL OMNIPAQUE IOHEXOL 350 MG/ML SOLN COMPARISON:  None. FINDINGS: Cardiovascular: Satisfactory opacification of the pulmonary arteries to the segmental level. No evidence of pulmonary embolism. Mild cardiomegaly. No pericardial effusion. Mediastinum/Nodes: No enlarged mediastinal, hilar, or axillary lymph nodes. Thyroid gland, trachea, and esophagus demonstrate no significant findings.  Lungs/Pleura: Lungs are clear. No pleural effusion or pneumothorax. Upper Abdomen: No acute abnormality. Musculoskeletal: No chest wall abnormality. No acute or significant osseous findings. Review of the MIP images confirms the above findings. IMPRESSION: 1. Negative examination for pulmonary embolism. 2. Mild cardiomegaly. Electronically Signed   By: Lauralyn Primes M.D.   On: 02/07/2021 08:57    ____________________________________________   PROCEDURES  Procedure(s) performed (including Critical Care):  .1-3 Lead EKG Interpretation  Date/Time: 02/07/2021 9:12 AM Performed by: Gilles Chiquito, MD Authorized by: Gilles Chiquito, MD     Interpretation: normal     ECG rate assessment: normal     Rhythm: sinus rhythm     Ectopy: none     Conduction: normal     ____________________________________________   INITIAL IMPRESSION / ASSESSMENT AND PLAN / ED COURSE      Patient presents with above-stated exam for assessment of some left-sided chest discomfort rating in the left arm associated with cough and some nausea.  Patient states he developed some bloody phlegm.  On arrival she is afebrile and hemodynamically stable.  Differential includes possible bronchitis, PE, pneumonia, with lower concern for significant alveolar hemorrhage or malignancy at this time given patient's age and absence of risk factors.  EKG and nonelevated troponin are not consistent with ACS.  CBC shows no leukocytosis or acute anemia.  BMP shows no significant electrolyte or metabolic derangements.  Chest x-ray is unremarkable for evidence of pneumonia, effusion, edema or clear acute process given elevated D-dimer at 0.55 CTA obtained.  There is no evidence of PE or other clear acute thoracic process illicitly enlarged heart which I mention the patient and I believe is related to symptoms today.  Differential still includes possible pleurisy, GI etiologies and costochondritis.  Low suspicion for immediate  life-threatening process and I believe patient stable for discharge with close outpatient PCP follow-up.  Discharged stable condition.  Strict return precautions advised and discussed.  Patient did decline COVID testing as bronchitis is also on differential.        ____________________________________________   FINAL CLINICAL IMPRESSION(S) / ED DIAGNOSES  Final diagnoses:  Nonspecific chest pain    Medications  acetaminophen (TYLENOL) tablet 1,000 mg (1,000 mg Oral Given 02/07/21 0724)  iohexol (OMNIPAQUE) 350 MG/ML injection 75  mL (75 mLs Intravenous Contrast Given 02/07/21 7672)     ED Discharge Orders     None        Note:  This document was prepared using Dragon voice recognition software and may include unintentional dictation errors.    Gilles Chiquito, MD 02/07/21 [phone]

## 2021-06-30 ENCOUNTER — Encounter: Payer: Self-pay | Admitting: Licensed Clinical Social Worker

## 2021-06-30 ENCOUNTER — Other Ambulatory Visit: Payer: Self-pay

## 2021-06-30 ENCOUNTER — Ambulatory Visit
Admission: EM | Admit: 2021-06-30 | Discharge: 2021-06-30 | Disposition: A | Discharge status: Home / Self Care | Payer: Self-pay | Attending: Internal Medicine | Admitting: Internal Medicine

## 2021-06-30 DIAGNOSIS — L301 Dyshidrosis [pompholyx]: Secondary | ICD-10-CM

## 2021-06-30 DIAGNOSIS — L03012 Cellulitis of left finger: Secondary | ICD-10-CM

## 2021-06-30 MED ORDER — METHYLPREDNISOLONE 4 MG PO TBPK: ORAL_TABLET | ORAL | 0 refills | Status: DC

## 2021-06-30 MED ORDER — CEPHALEXIN 500 MG PO CAPS: 1000.0000 mg | ORAL_CAPSULE | Freq: Two times a day (BID) | ORAL | 0 refills | Status: DC

## 2021-06-30 NOTE — ED Triage Notes (Signed)
Pt c/o both hands swelling, burning and itching x 2 days.

## 2021-06-30 NOTE — Discharge Instructions (Addendum)
Avoid wet and dry constantly until you are healed.

## 2021-06-30 NOTE — ED Provider Notes (Signed)
[facility]    CSN: [id number] Arrival date & time: 06/30/21  1927      History   Chief Complaint No chief complaint on file.   HPI Deborah Lambert is a 35 y.o. female. with hx of white blisters since 10/27 and mild itching. Then 2 days ago, her hands started itching worse, and now has burning pain specially on her L ring finger. She does wet and dry her hands often at work. Denies using any chemicals on her hands. Or new soaps.     Past Medical History:  Diagnosis Date   Anxiety    Bell's palsy    Cluster headaches    Focal seizures (HCC)    PTSD (post-traumatic stress disorder)    Seizures (HCC)     There are no problems to display for this patient.   Past Surgical History:  Procedure Laterality Date   right arm surgery      OB History   No obstetric history on file.      Home Medications    Prior to Admission medications   Medication Sig Start Date End Date Taking? Authorizing Provider  albuterol (VENTOLIN HFA) 108 (90 Base) MCG/ACT inhaler SMARTSIG:1-2 Puff(s) By Mouth Every 4-6 Hours PRN 07/06/19  Yes [provider]  cephALEXin (KEFLEX) 500 MG capsule Take 2 capsules (1,000 mg total) by mouth 2 (two) times daily. 06/30/21  Yes Rodriguez-Southworth, Nettie Elm, PA-C  methylPREDNISolone (MEDROL DOSEPAK) 4 MG TBPK tablet Take as directed 06/30/21  Yes Rodriguez-Southworth, Nettie Elm, PA-C  cyclobenzaprine (FLEXERIL) 10 MG tablet Take 1 tablet (10 mg total) by mouth 2 (two) times daily as needed for muscle spasms. Do not drive while taking as can cause drowsiness 07/31/19   Renford Dills, NP  hydrochlorothiazide (HYDRODIURIL) 25 MG tablet hydrochlorothiazide 25 mg tablet  TAKE 1 2 (ONE HALF) TABLET BY MOUTH ONCE DAILY    [provider]  ibuprofen (ADVIL) 800 MG tablet Take 1 tablet (800 mg total) by mouth 3 (three) times daily. 01/02/20   Bailey Mech, NP  traMADol (ULTRAM) 50 MG tablet Take 1 tablet (50 mg total) by mouth every 8  (eight) hours as needed. 07/31/19   Renford Dills, NP  omeprazole (PRILOSEC) 20 MG capsule Take 1 capsule (20 mg total) by mouth daily. 06/01/19 07/31/19  Tanda Rockers, PA-C    Family History Family History  Problem Relation Age of Onset   Healthy Mother    Healthy Father     Social History Social History   Tobacco Use   Smoking status: Every Day    Types: Cigarettes   Smokeless tobacco: Never  Vaping Use   Vaping Use: Never used  Substance Use Topics   Alcohol use: No   Drug use: No     Allergies   Aspirin, Codeine, Almond oil, and Coconut oil   Review of Systems Review of Systems  Constitutional:  Negative for fever.  Musculoskeletal:  Positive for joint swelling.  Skin:  Positive for color change, rash and wound. Negative for pallor.  Hematological:  Negative for adenopathy.    Physical Exam Triage Vital Signs ED Triage Vitals  Enc Vitals Group     BP 06/30/21 1935 118/82     Pulse Rate 06/30/21 1935 81     Resp 06/30/21 1935 16     Temp 06/30/21 1935 98.5 F (36.9 C)     Temp Source 06/30/21 1935 Oral     SpO2 06/30/21 1935 100 %     Weight  06/30/21 1933 200 lb (90.7 kg)     Height 06/30/21 1933 5' 4.5" (1.638 m)     Head Circumference --      Peak Flow --      Pain Score 06/30/21 1933 10     Pain Loc --      Pain Edu? --      Excl. in GC? --    No data found.  Updated Vital Signs BP 118/82 (BP Location: Left Arm)    Pulse 81    Temp 98.5 F (36.9 C) (Oral)    Resp 16    Ht 5' 4.5" (1.638 m)    Wt 200 lb (90.7 kg)    LMP 06/17/2021    SpO2 100%    BMI 33.80 kg/m   Visual Acuity Right Eye Distance:   Left Eye Distance:   Bilateral Distance:    Right Eye Near:   Left Eye Near:    Bilateral Near:     Physical Exam Vitals and nursing note reviewed.  Constitutional:      General: She is not in acute distress.    Appearance: She is not toxic-appearing.  HENT:     Right Ear: External ear normal.     Left Ear: External ear normal.   Eyes:     General: No scleral icterus.    Conjunctiva/sclera: Conjunctivae normal.  Pulmonary:     Effort: Pulmonary effort is normal.  Musculoskeletal:     Cervical back: Neck supple.  Skin:    General: Skin is warm and dry.     Findings: Rash present.     Comments: HANDS/FINGERS-Has dry skin patches between fingers, one on dorsal wrist ober styloid. Where she had the blisters she has formed a few punctate scabs. Her L ring finger is warm and erythematous.   Neurological:     Mental Status: She is alert and oriented to person, place, and time.     Gait: Gait normal.  Psychiatric:        Mood and Affect: Mood normal.        Behavior: Behavior normal.        Thought Content: Thought content normal.        Judgment: Judgment normal.     UC Treatments / Results  Labs (all labs ordered are listed, but only abnormal results are displayed) Labs Reviewed - No data to display  EKG   Radiology No results found.  Procedures Procedures (including critical care time)  Medications Ordered in UC Medications - No data to display  Initial Impression / Assessment and Plan / UC Course  I have reviewed the triage vital signs and the nursing notes. Has dyshidrotic eczema with cellulitis I placed her on keflex and medrol dose pack as noted. I educated her how to moisturize her hands with a cream in a jar and told to avoid recurrent wet and drying of hands.  Final Clinical Impressions(s) / UC Diagnoses   Final diagnoses:  Dyshidrotic eczema     Discharge Instructions      Avoid wet and dry constantly until you are healed.      ED Prescriptions     Medication Sig Dispense Auth. Provider   cephALEXin (KEFLEX) 500 MG capsule Take 2 capsules (1,000 mg total) by mouth 2 (two) times daily. 28 capsule Rodriguez-Southworth, Deleon Passe, PA-C   methylPREDNISolone (MEDROL DOSEPAK) 4 MG TBPK tablet Take as directed 21 tablet Rodriguez-Southworth, Nettie Elm, PA-C      PDMP not reviewed this  encounter.   Garey Ham, PA-C 06/30/21 2015

## 2021-07-10 ENCOUNTER — Other Ambulatory Visit: Payer: Self-pay

## 2021-07-10 DIAGNOSIS — Z5321 Procedure and treatment not carried out due to patient leaving prior to being seen by health care provider: Secondary | ICD-10-CM | POA: Insufficient documentation

## 2021-07-10 DIAGNOSIS — T7840XA Allergy, unspecified, initial encounter: Secondary | ICD-10-CM | POA: Insufficient documentation

## 2021-07-10 NOTE — ED Triage Notes (Signed)
Pt presents via POV c/o allergic reaction. Pt c/o pain and burning in bilateral hands. Reports seen in ED 12/30 with similar symptoms and placed on abx and prednisone. Reports some relief until medication completed then symptoms returned. Ambulatory to triage.

## 2021-07-10 NOTE — ED Triage Notes (Signed)
Pt reports took Benadryl PTA x1 hr.

## 2021-07-11 ENCOUNTER — Encounter: Payer: Self-pay | Admitting: Emergency Medicine

## 2021-07-11 ENCOUNTER — Emergency Department
Admission: EM | Admit: 2021-07-11 | Discharge: 2021-07-11 | Discharge status: Against Medical Advice | Payer: Medicaid Other | Attending: Emergency Medicine | Admitting: Emergency Medicine

## 2021-07-11 ENCOUNTER — Ambulatory Visit
Admission: EM | Admit: 2021-07-11 | Discharge: 2021-07-11 | Disposition: A | Discharge status: Home / Self Care | Payer: Self-pay | Attending: Internal Medicine | Admitting: Internal Medicine

## 2021-07-11 DIAGNOSIS — L309 Dermatitis, unspecified: Secondary | ICD-10-CM

## 2021-07-11 MED ORDER — TRIAMCINOLONE ACETONIDE 0.025 % EX OINT: 1.0000 "application " | TOPICAL_OINTMENT | Freq: Two times a day (BID) | CUTANEOUS | 0 refills | Status: DC

## 2021-07-11 NOTE — ED Triage Notes (Addendum)
Pt reports that was seen here recently for rash and hand irritation. Reports symptoms are getting worse esp swelling. Adds that she is now having chills, shakes and not feeling well.

## 2021-07-11 NOTE — ED Provider Notes (Signed)
[facility]    CSN: [id number] Arrival date & time: 07/11/21  0827      History   Chief Complaint Chief Complaint  Patient presents with   Rash   Hand Pain    HPI Deborah Lambert is a 36 y.o. female.  She presents today with persistent irritation, redness, soreness, itching, and some patches of blisters, on both hands.  She was seen at the urgent care on December 30 with similar symptoms, some focal infection of the left ring finger, and was treated with oral steroids and Keflex.  She does not feel like she experienced a lot of improvement in her symptoms, and has been off of the Keflex and steroids for a couple of days now.  Still with a lot of itching and irritation, soreness.  No fever.  No sunburny redness.  There are scattered small areas of broken skin, but punctate, without golden crusting or purulence.  HPI  Past Medical History:  Diagnosis Date   Anxiety    Bell's palsy    Cluster headaches    Focal seizures (HCC)    PTSD (post-traumatic stress disorder)    Seizures (HCC)     There are no problems to display for this patient.   Past Surgical History:  Procedure Laterality Date   right arm surgery       Home Medications    Prior to Admission medications   Medication Sig Start Date End Date Taking? Authorizing Provider  triamcinolone (KENALOG) 0.025 % ointment Apply 1 application topically 2 (two) times daily. To both hands. 07/11/21  Yes Isa RankinMurray, Shawndell Varas Wilson, MD  albuterol (VENTOLIN HFA) 108 (90 Base) MCG/ACT inhaler SMARTSIG:1-2 Puff(s) By Mouth Every 4-6 Hours PRN 07/06/19   [provider]  cephALEXin (KEFLEX) 500 MG capsule Take 2 capsules (1,000 mg total) by mouth 2 (two) times daily. 06/30/21   Rodriguez-Southworth, Nettie ElmSylvia, PA-C  cyclobenzaprine (FLEXERIL) 10 MG tablet Take 1 tablet (10 mg total) by mouth 2 (two) times daily as needed for muscle spasms. Do not drive while taking as can cause drowsiness 07/31/19   Renford DillsMiller, Lindsey, NP   hydrochlorothiazide (HYDRODIURIL) 25 MG tablet hydrochlorothiazide 25 mg tablet  TAKE 1 2 (ONE HALF) TABLET BY MOUTH ONCE DAILY    [provider]  ibuprofen (ADVIL) 800 MG tablet Take 1 tablet (800 mg total) by mouth 3 (three) times daily. 01/02/20   Bailey MechBenjamin, Lunise, NP  methylPREDNISolone (MEDROL DOSEPAK) 4 MG TBPK tablet Take as directed 06/30/21   Rodriguez-Southworth, Nettie ElmSylvia, PA-C  traMADol (ULTRAM) 50 MG tablet Take 1 tablet (50 mg total) by mouth every 8 (eight) hours as needed. 07/31/19   Renford DillsMiller, Lindsey, NP  omeprazole (PRILOSEC) 20 MG capsule Take 1 capsule (20 mg total) by mouth daily. 06/01/19 07/31/19  Tanda RockersVenter, Margaux, PA-C    Family History Family History  Problem Relation Age of Onset   Healthy Mother    Healthy Father     Social History Social History   Tobacco Use   Smoking status: Every Day    Types: Cigarettes   Smokeless tobacco: Never  Vaping Use   Vaping Use: Never used  Substance Use Topics   Alcohol use: No   Drug use: No     Allergies   Aspirin, Codeine, Almond oil, and Coconut oil   Review of Systems Review of Systems see HPI   Physical Exam Triage Vital Signs ED Triage Vitals  Enc Vitals Group     BP 07/11/21 0836 107/69  Pulse Rate 07/11/21 0836 73     Resp 07/11/21 0836 19     Temp 07/11/21 0836 98.2 F (36.8 C)     Temp Source 07/11/21 0836 Oral     SpO2 07/11/21 0836 99 %     Weight --      Height --      Pain Score 07/11/21 0834 10     Pain Loc --     Updated Vital Signs BP 107/69 (BP Location: Left Arm)    Pulse 73    Temp 98.2 F (36.8 C) (Oral)    Resp 19    LMP 06/17/2021    SpO2 99%   Physical Exam Constitutional:      General: She is in acute distress.     Appearance: She is not ill-appearing or toxic-appearing.     Comments: Nicely groomed.  In some distress because of irritation in hands.  Hard to avoid scratching/picking.  HENT:     Head: Atraumatic.     Mouth/Throat:     Mouth: Mucous membranes are  moist.  Eyes:     Comments: Conjugate gaze observed  Cardiovascular:     Rate and Rhythm: Normal rate.  Pulmonary:     Effort: Pulmonary effort is normal. No respiratory distress.  Abdominal:     General: There is no distension.  Musculoskeletal:     Cervical back: Neck supple.     Comments: Walked into urgent care independently  Skin:    General: Skin is warm and dry.     Comments: No cyanosis Hands with patchy dull red erythema, patches of tiny blisters on the sides of some fingers, little bit more involvement around the base of the right thumbnail and along the left ring finger, with punctate scabs/erosions.  No bright/blanching erythema, no golden crusting, no purulent exudate.  Minimal swelling.  Neurological:     Mental Status: She is alert.     Comments: Face is symmetric, speech is clear/coherent     UC Treatments / Results  Labs (all labs ordered are listed, but only abnormal results are displayed) Labs Reviewed - No data to display N/A  EKG N/A  Radiology No results found. N/A  Procedures Procedures (including critical care time) N/A  Medications Ordered in UC Medications - No data to display N/A  Initial Impression / Assessment and Plan / UC Course  Differential diagnosis includes eczema, pompholyx, irritant effect of topical exposure, allergic effect of topical exposure; less likely psoriasis.  No signs of frank infection, vitals and exam reassuring.  Protect hands from exposures, manage exposures to moisture, rx topical steroids, manage expectations for reasonable improvements/timeframes.  Followup derm for specialty input.     Final Clinical Impressions(s) / UC Diagnoses   Final diagnoses:  Hand dermatitis     Discharge Instructions      The most likely cause of your hand irritation with itching, redness, blistery patches and soreness, is hand dermatitis/dyshidrotic eczema.  Please read the attached handout about contact dermatitis as a lot of  this applies.  Prescription for triamcinolone in eucerin cream, to moisturize and reduce inflammation, was sent to the pharmacy.  Use this under white cotton gloves twice daily until symptoms settles down--will probably take 10 days but might take as long as 21 days.  After this continue using the triamcinolone ointment cream at least once daily to keep symptoms under control.  Avoid irritants including most otc moisturizers and soaps.  Wear rubber gloves if your hands are  in water with white cotton gloves under, and change the cotton gloves when sweaty.  Do not scrub or pick hands and avoid hot water.  Dove soap is very mild and vaseline ointment in general is well tolerated.  Another safe choice is lubriderm lotion.     ED Prescriptions     Medication Sig Dispense Auth. Provider   triamcinolone (KENALOG) 0.025 % ointment Apply 1 application topically 2 (two) times daily. To both hands. 454 g Isa Rankin, MD      I have reviewed the PDMP during this encounter.   Isa Rankin, MD 07/12/21 1028

## 2021-07-11 NOTE — ED Notes (Signed)
No answer when called several times from lobby 

## 2021-07-11 NOTE — ED Notes (Signed)
No answer in lobby when called for repeat vitals °

## 2021-07-11 NOTE — Discharge Instructions (Addendum)
The most likely cause of your hand irritation with itching, redness, blistery patches and soreness, is hand dermatitis/dyshidrotic eczema.  Please read the attached handout about contact dermatitis as a lot of this applies.  Prescription for triamcinolone in eucerin cream, to moisturize and reduce inflammation, was sent to the pharmacy.  Use this under white cotton gloves twice daily until symptoms settles down--will probably take 10 days but might take as long as 21 days.  After this continue using the triamcinolone ointment cream at least once daily to keep symptoms under control.  Avoid irritants including most otc moisturizers and soaps.  Wear rubber gloves if your hands are in water with white cotton gloves under, and change the cotton gloves when sweaty.  Do not scrub or pick hands and avoid hot water.  Dove soap is very mild and vaseline ointment in general is well tolerated.  Another safe choice is lubriderm lotion.

## 2021-07-28 ENCOUNTER — Other Ambulatory Visit: Payer: Self-pay

## 2021-07-28 ENCOUNTER — Emergency Department
Admission: EM | Admit: 2021-07-28 | Discharge: 2021-07-28 | Disposition: A | Discharge status: Home / Self Care | Payer: Self-pay | Attending: Emergency Medicine | Admitting: Emergency Medicine

## 2021-07-28 ENCOUNTER — Emergency Department: Payer: Self-pay

## 2021-07-28 DIAGNOSIS — Y92410 Unspecified street and highway as the place of occurrence of the external cause: Secondary | ICD-10-CM | POA: Insufficient documentation

## 2021-07-28 DIAGNOSIS — S0083XA Contusion of other part of head, initial encounter: Secondary | ICD-10-CM | POA: Insufficient documentation

## 2021-07-28 MED ORDER — ACETAMINOPHEN 325 MG PO TABS
650.0000 mg | ORAL_TABLET | Freq: Once | ORAL | Status: AC
  Administered 2021-07-28: 650 mg via ORAL
  Filled 2021-07-28: qty 2

## 2021-07-28 NOTE — Discharge Instructions (Addendum)
Call your regular doctor if not improving in 3 to 4 days.  Return emergency department for worsening. Take tylenol for pain  Follow up with [facility]
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
## 2022-03-20 ENCOUNTER — Ambulatory Visit
Admission: EM | Admit: 2022-03-20 | Discharge: 2022-03-20 | Disposition: A | Discharge status: Home / Self Care | Payer: Self-pay | Attending: Physician Assistant | Admitting: Physician Assistant

## 2022-03-20 DIAGNOSIS — R059 Cough, unspecified: Secondary | ICD-10-CM

## 2022-03-20 DIAGNOSIS — J45909 Unspecified asthma, uncomplicated: Secondary | ICD-10-CM

## 2022-03-20 DIAGNOSIS — R0981 Nasal congestion: Secondary | ICD-10-CM

## 2022-03-20 MED ORDER — IPRATROPIUM BROMIDE 0.06 % NA SOLN: 2.0000 | Freq: Four times a day (QID) | NASAL | 12 refills | Status: DC

## 2022-03-20 MED ORDER — ALBUTEROL SULFATE HFA 108 (90 BASE) MCG/ACT IN AERS: 2.0000 | INHALATION_SPRAY | Freq: Four times a day (QID) | RESPIRATORY_TRACT | 1 refills | Status: DC | PRN

## 2022-03-20 MED ORDER — PROMETHAZINE-DM 6.25-15 MG/5ML PO SYRP: 5.0000 mL | ORAL_SOLUTION | Freq: Four times a day (QID) | ORAL | 0 refills | Status: DC | PRN

## 2022-03-20 MED ORDER — AZITHROMYCIN 250 MG PO TABS: ORAL_TABLET | ORAL | 0 refills | Status: DC

## 2022-03-20 NOTE — ED Triage Notes (Signed)
Pt c/o cough x5days  Pt has asthma  Pt states that she has an inhaler and it does not help.  Pt has pain in her chest when coughing.

## 2022-03-20 NOTE — Discharge Instructions (Addendum)
-  Promethazine DM cough syrup: 5 mL 4 times a day as needed for cough -Albuterol inhaler: 2 puffs every 6 hours as needed for continued cough -If no improvement in 2-3 days, or should your symptoms worsen, can start the antibiotics -Atrovent nasal spray: 4 times daily to help relieve nasal congestion and help clear up the drainage -Can also use over-the-counter Claritin, Zyrtec, or Allegra nightly to also help with drainage. -If you have continued issues, can follow-up with primary care or pulmonology.

## 2022-03-20 NOTE — ED Provider Notes (Signed)
[facility]    CSN: [id number] Arrival date & time: 03/20/22  1943      History   Chief Complaint Chief Complaint  Patient presents with   Cough    HPI Deborah Lambert is a 36 y.o. female.   Patient is a 36 year old female past medical history of asthma who presents with a cough for last few days.  Patient reports that has aggravated her asthma.  Patient also reports some congestion and some runny nose.  Patient also report some shortness of breath and chest pain.  Patient does use a albuterol inhaler at home but states she can only take 2 puffs twice a day per her prescription.  Cough has been productive of clear mucus.  Patient allergies reviewed in epic  Patient reports she has been hospitalized and placed on mechanical ventilation in the past for her asthma.    Past Medical History:  Diagnosis Date   Anxiety    Bell's palsy    Cluster headaches    Focal seizures (HCC)    PTSD (post-traumatic stress disorder)    Seizures ([place])     There are no problems to display for this patient.   Past Surgical History:  Procedure Laterality Date   right arm surgery      OB History   No obstetric history on file.      Home Medications    Prior to Admission medications   Medication Sig Start Date End Date Taking? Authorizing Provider  albuterol (VENTOLIN HFA) 108 (90 Base) MCG/ACT inhaler Inhale 2 puffs into the lungs every 6 (six) hours as needed for wheezing or shortness of breath (cough inhaler). 03/20/22  Yes Luvenia Redden, PA-C  azithromycin (ZITHROMAX Z-PAK) 250 MG tablet Take 2 tablets by mouth the first day followed by one tablet daily for next 4 days. 03/20/22  Yes Luvenia Redden, PA-C  ipratropium (ATROVENT) 0.06 % nasal spray Place 2 sprays into both nostrils 4 (four) times daily. 03/20/22  Yes Luvenia Redden, PA-C  promethazine-dextromethorphan (PROMETHAZINE-DM) 6.25-15 MG/5ML syrup Take 5 mLs by mouth 4 (four) times daily as needed for  cough. 03/20/22  Yes Luvenia Redden, PA-C  albuterol (VENTOLIN HFA) 108 (90 Base) MCG/ACT inhaler SMARTSIG:1-2 Puff(s) By Mouth Every 4-6 Hours PRN 07/06/19   [provider]  ibuprofen (ADVIL) 800 MG tablet Take 1 tablet (800 mg total) by mouth 3 (three) times daily. 01/02/20   Gertie Baron, NP  triamcinolone (KENALOG) 0.025 % ointment Apply 1 application topically 2 (two) times daily. To both hands. 07/11/21   Wynona Luna, MD  omeprazole (PRILOSEC) 20 MG capsule Take 1 capsule (20 mg total) by mouth daily. 06/01/19 07/31/19  Eustaquio Maize, PA-C    Family History Family History  Problem Relation Age of Onset   Healthy Mother    Healthy Father     Social History Social History   Tobacco Use   Smoking status: Former    Types: Cigarettes   Smokeless tobacco: Never  Vaping Use   Vaping Use: Never used  Substance Use Topics   Alcohol use: No   Drug use: No     Allergies   Aspirin, Codeine, Almond oil, and Coconut (cocos nucifera)   Review of Systems Review of Systems as noted in HPI.  Other systems reviewed and found be negative   Physical Exam Triage Vital Signs ED Triage Vitals  Enc Vitals Group     BP 03/20/22 1959 (!) 121/94  Pulse Rate 03/20/22 1959 (!) 106     Resp 03/20/22 1959 18     Temp 03/20/22 1959 98.2 F (36.8 C)     Temp Source 03/20/22 1959 Oral     SpO2 03/20/22 1959 94 %     Weight 03/20/22 1957 200 lb (90.7 kg)     Height 03/20/22 1957 5\' 5"  (1.651 m)     Head Circumference --      Peak Flow --      Pain Score 03/20/22 1956 8     Pain Loc --      Pain Edu? --      Excl. in GC? --    No data found.  Updated Vital Signs BP (!) 121/94 (BP Location: Left Arm)   Pulse (!) 106   Temp 98.2 F (36.8 C) (Oral)   Resp 18   Ht 5\' 5"  (1.651 m)   Wt 200 lb (90.7 kg)   LMP 03/10/2022   SpO2 94%   BMI 33.28 kg/m   Visual Acuity Right Eye Distance:   Left Eye Distance:   Bilateral Distance:    Right Eye Near:   Left  Eye Near:    Bilateral Near:     Physical Exam Constitutional:      Appearance: Normal appearance.  HENT:     Right Ear: Tympanic membrane and ear canal normal.     Left Ear: Tympanic membrane and ear canal normal.     Nose: Congestion present.     Mouth/Throat:     Mouth: Mucous membranes are moist.     Comments: Bilateral tonsils +1.  Clear postnasal drainage Cardiovascular:     Rate and Rhythm: Normal rate and regular rhythm.  Pulmonary:     Effort: Pulmonary effort is normal.     Breath sounds: No wheezing, rhonchi or rales.     Comments: Frequent dry cough Skin:    Capillary Refill: Capillary refill takes less than 2 seconds.  Neurological:     General: No focal deficit present.     Mental Status: She is alert and oriented to person, place, and time.  Psychiatric:        Mood and Affect: Mood normal.        Behavior: Behavior normal.      UC Treatments / Results  Labs (all labs ordered are listed, but only abnormal results are displayed) Labs Reviewed - No data to display  EKG   Radiology No results found.  Procedures Procedures (including critical care time)  Medications Ordered in UC Medications - No data to display  Initial Impression / Assessment and Plan / UC Course  I have reviewed the triage vital signs and the nursing notes.  Pertinent labs & imaging results that were available during my care of the patient were reviewed by me and considered in my medical decision making (see chart for details).    Patient comes in for cough times a few days and is aggravating her asthma.  She also reports some nasal congestion and some runny nose.  Some chest pain and shortness of breath.  She has used her albuterol at home.  No wheezing on exam but frequent cough.  We will refill her prescription for albuterol, recommending 2 puffs every 6 hours as needed for continuing cough.  Promethazine DM cough syrup 4 times a day as needed for cough.  Also give her  prescription for azithromycin should her symptoms not improve within 2-3 days.  Have her follow-up  with her primary care pulmonologist should symptoms not improve.  We will also give her Atrovent spray for her congestion and recommend over-the-counter allergy medication  Final Clinical Impressions(s) / UC Diagnoses   Final diagnoses:  Cough, unspecified type  Nasal congestion  Asthma, unspecified asthma severity, unspecified whether complicated, unspecified whether persistent     Discharge Instructions      -Promethazine DM cough syrup: 5 mL 4 times a day as needed for cough -Albuterol inhaler: 2 puffs every 6 hours as needed for continued cough -If no improvement in 2-3 days, or should your symptoms worsen, can start the antibiotics -Atrovent nasal spray: 4 times daily to help relieve nasal congestion and help clear up the drainage -Can also use over-the-counter Claritin, Zyrtec, or Allegra nightly to also help with drainage. -If you have continued issues, can follow-up with primary care or pulmonology.     ED Prescriptions     Medication Sig Dispense Auth. Provider   albuterol (VENTOLIN HFA) 108 (90 Base) MCG/ACT inhaler Inhale 2 puffs into the lungs every 6 (six) hours as needed for wheezing or shortness of breath (cough inhaler). 18 g Candis Schatz, PA-C   promethazine-dextromethorphan (PROMETHAZINE-DM) 6.25-15 MG/5ML syrup Take 5 mLs by mouth 4 (four) times daily as needed for cough. 118 mL Candis Schatz, PA-C   azithromycin (ZITHROMAX Z-PAK) 250 MG tablet Take 2 tablets by mouth the first day followed by one tablet daily for next 4 days. 6 tablet Sherrlyn Hock D, PA-C   ipratropium (ATROVENT) 0.06 % nasal spray Place 2 sprays into both nostrils 4 (four) times daily. 15 mL Candis Schatz, PA-C      PDMP not reviewed this encounter.   Candis Schatz, PA-C 03/20/22 2021

## 2022-07-02 DIAGNOSIS — S060XAA Concussion with loss of consciousness status unknown, initial encounter: Secondary | ICD-10-CM

## 2022-07-02 HISTORY — DX: Concussion with loss of consciousness status unknown, initial encounter: S06.0XAA

## 2022-08-03 ENCOUNTER — Ambulatory Visit (INDEPENDENT_AMBULATORY_CARE_PROVIDER_SITE_OTHER): Payer: Medicaid Other

## 2022-08-03 ENCOUNTER — Ambulatory Visit
Admission: EM | Admit: 2022-08-03 | Discharge: 2022-08-03 | Disposition: A | Discharge status: Home / Self Care | Payer: Medicaid Other | Attending: Physician Assistant | Admitting: Physician Assistant

## 2022-08-03 DIAGNOSIS — R079 Chest pain, unspecified: Secondary | ICD-10-CM

## 2022-08-03 DIAGNOSIS — J45998 Other asthma: Secondary | ICD-10-CM | POA: Diagnosis not present

## 2022-08-03 DIAGNOSIS — J45901 Unspecified asthma with (acute) exacerbation: Secondary | ICD-10-CM

## 2022-08-03 DIAGNOSIS — R051 Acute cough: Secondary | ICD-10-CM | POA: Diagnosis not present

## 2022-08-03 DIAGNOSIS — J040 Acute laryngitis: Secondary | ICD-10-CM

## 2022-08-03 MED ORDER — ALBUTEROL SULFATE (2.5 MG/3ML) 0.083% IN NEBU: 2.5000 mg | INHALATION_SOLUTION | Freq: Four times a day (QID) | RESPIRATORY_TRACT | 0 refills | Status: DC | PRN

## 2022-08-03 MED ORDER — ALBUTEROL SULFATE (2.5 MG/3ML) 0.083% IN NEBU
2.5000 mg | INHALATION_SOLUTION | Freq: Once | RESPIRATORY_TRACT | Status: AC
  Administered 2022-08-03: 2.5 mg via RESPIRATORY_TRACT

## 2022-08-03 MED ORDER — PREDNISONE 10 MG PO TABS: ORAL_TABLET | ORAL | 0 refills | Status: DC
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
## 2022-08-03 NOTE — ED Notes (Signed)
Given nebulizer to take home. Pt instructed how to use it. Pt verbalized understanding.

## 2022-08-03 NOTE — Discharge Instructions (Signed)
-  EKG was normal. - Your chest x-ray is clear. - You have had asthma exacerbation due to unknown trigger.  If you have to go back to your home, wear a mask.  Try to stay elsewhere until the area is ventilated and treated. - Use the nebulizer or inhaler as needed for shortness of breath. - I also sent a corticosteroid taper.  You can take over-the-counter Mucinex and increase your fluid intake. - If you feel that your chest discomfort or breathing worsen or do not improve with use of the inhalers or nebulizers, go to emergency department.

## 2022-08-13 ENCOUNTER — Telehealth: Payer: Self-pay

## 2022-08-13 NOTE — Telephone Encounter (Signed)
Mychart msg sent. AS, CMA

## 2022-08-22 DIAGNOSIS — L309 Dermatitis, unspecified: Secondary | ICD-10-CM | POA: Diagnosis not present

## 2022-09-10 ENCOUNTER — Emergency Department
Admission: EM | Admit: 2022-09-10 | Discharge: 2022-09-10 | Disposition: A | Discharge status: Home / Self Care | Payer: Medicaid Other | Attending: Emergency Medicine | Admitting: Emergency Medicine

## 2022-09-10 ENCOUNTER — Emergency Department: Payer: Medicaid Other

## 2022-09-10 ENCOUNTER — Other Ambulatory Visit: Payer: Self-pay

## 2022-09-10 DIAGNOSIS — R0789 Other chest pain: Secondary | ICD-10-CM | POA: Insufficient documentation

## 2022-09-10 DIAGNOSIS — R002 Palpitations: Secondary | ICD-10-CM | POA: Insufficient documentation

## 2022-09-10 DIAGNOSIS — R079 Chest pain, unspecified: Secondary | ICD-10-CM

## 2022-09-10 LAB — BASIC METABOLIC PANEL
Anion gap: 7 (ref 5–15)
BUN: 10 mg/dL (ref 6–20)
CO2: 24 mmol/L (ref 22–32)
Calcium: 8.9 mg/dL (ref 8.9–10.3)
Chloride: 106 mmol/L (ref 98–111)
Creatinine, Ser: 0.74 mg/dL (ref 0.44–1.00)
GFR, Estimated: >60 mL/min (ref >60)
Glucose, Bld: 100 mg/dL — ABNORMAL HIGH (ref 70–99)
Potassium: 3.9 mmol/L (ref 3.5–5.1)
Sodium: 137 mmol/L (ref 135–145)

## 2022-09-10 LAB — CBC
HCT: 42.3 % (ref 36.0–46.0)
Hemoglobin: 13.8 g/dL (ref 12.0–15.0)
MCH: 29.1 pg (ref 26.0–34.0)
MCHC: 32.6 g/dL (ref 30.0–36.0)
MCV: 89.1 fL (ref 80.0–100.0)
Platelets: 223 10*3/uL (ref 150–400)
RBC: 4.75 MIL/uL (ref 3.87–5.11)
RDW: 12.3 % (ref 11.5–15.5)
WBC: 7.4 10*3/uL (ref 4.0–10.5)
nRBC: 0 % (ref 0.0–0.2)

## 2022-09-10 LAB — TROPONIN I (HIGH SENSITIVITY): Troponin I (High Sensitivity): 3 ng/L (ref <18)

## 2022-09-10 MED ORDER — LIDOCAINE 5 % EX PTCH
1.0000 | MEDICATED_PATCH | CUTANEOUS | Status: DC
  Administered 2022-09-10: 1 via TRANSDERMAL
  Filled 2022-09-10: qty 1

## 2022-09-10 NOTE — ED Provider Notes (Signed)
[facility] Provider Note    Event Date/Time   First MD Initiated Contact with Patient 09/10/22 1023     (approximate)   History   Chief Complaint Chest Pain   HPI  Deborah Lambert is a 37 y.o. female with past medical history of focal seizures and PTSD who presents to the ED complaining of chest pain.  Patient reports that she has had approximately 2 days of constant soreness in the center of her chest.  She describes it as a "elephant sitting on my chest" with the pain not being exacerbated or alleviated by thing in particular.  She reports occasional palpitations as well as some difficulty breathing, but is not currently having any difficulty breathing or palpitations.  She has not had any fevers or cough, has not noticed any pain or swelling in her legs.  She reports a history of GERD related to hiatal hernia, but states current symptoms are different.     Physical Exam   Triage Vital Signs: ED Triage Vitals [09/10/22 1018]  Enc Vitals Group     BP 118/84     Pulse Rate 82     Resp 18     Temp 97.8 F (36.6 C)     Temp Source Oral     SpO2 99 %     Weight 199 lb 15.3 oz (90.7 kg)     Height '5\' 5"'$  (1.651 m)     Head Circumference      Peak Flow      Pain Score 10     Pain Loc      Pain Edu?      Excl. in [place]?     Most recent vital signs: Vitals:   09/10/22 1121 09/10/22 1158  BP: 101/70 103/73  Pulse: 69 69  Resp: 16 15  Temp:    SpO2: 99% 98%    Constitutional: Alert and oriented. Eyes: Conjunctivae are normal. Head: Atraumatic. Nose: No congestion/rhinnorhea. Mouth/Throat: Mucous membranes are moist.  Cardiovascular: Normal rate, regular rhythm. Grossly normal heart sounds.  2+ radial pulses bilaterally. Respiratory: Normal respiratory effort.  No retractions. Lungs CTAB.  Anterior chest wall tenderness to palpation noted. Gastrointestinal: Soft and nontender. No distention. Musculoskeletal: No lower extremity tenderness nor  edema.  Neurologic:  Normal speech and language. No gross focal neurologic deficits are appreciated.    ED Results / Procedures / Treatments   Labs (all labs ordered are listed, but only abnormal results are displayed) Labs Reviewed  BASIC METABOLIC PANEL - Abnormal; Notable for the following components:      Result Value   Glucose, Bld 100 (*)    All other components within normal limits  CBC  POC URINE PREG, ED  TROPONIN I (HIGH SENSITIVITY)  TROPONIN I (HIGH SENSITIVITY)     EKG  ED ECG REPORT I, Blake Divine, the attending physician, personally viewed and interpreted this ECG.   Date: 09/10/2022  EKG Time: 10:15  Rate: 83  Rhythm: normal sinus rhythm  Axis: Normal  Intervals:none  ST&T Change: None  RADIOLOGY Chest x-ray reviewed and interpreted by me with no infiltrate, edema, or effusion.  PROCEDURES:  Critical Care performed: No  Procedures   MEDICATIONS ORDERED IN ED: Medications  lidocaine (LIDODERM) 5 % 1 patch (1 patch Transdermal Patch Applied 09/10/22 1132)     IMPRESSION / MDM / ASSESSMENT AND PLAN / ED COURSE  I reviewed the triage vital signs and the nursing notes.  37 y.o. female with past medical history of focal seizures and PTSD who presents to the ED complaining of 2 days of constant aching pain in the center of her chest associated with intermittent palpitations and difficulty breathing.  Patient's presentation is most consistent with acute presentation with potential threat to life or bodily function.  Differential diagnosis includes, but is not limited to, ACS, arrhythmia, PE, pneumonia, pneumothorax, GERD, musculoskeletal pain, anxiety.  Patient nontoxic-appearing and in no acute distress, vital signs are unremarkable.  EKG shows no evidence of arrhythmia or ischemia and symptoms seem atypical for ACS.  Pain is reproducible with palpation of her chest, low suspicion of PE as patient is PERC negative.   We will screen labs including troponin, chest x-ray is negative for acute process.  Plan to treat symptomatically with Lidoderm patch and reassess.  Troponin within normal limits, chest x-ray is unremarkable.  Remainder of labs are reassuring with no significant anemia, leukocytosis, showed abnormality, or AKI.  Given reassuring workup, patient is appropriate for discharge home with PCP follow-up.  Suspect symptoms related to GERD versus musculoskeletal source and she was counseled to return to the ED for new or worsening symptoms.  Patient and spouse agree with plan.      FINAL CLINICAL IMPRESSION(S) / ED DIAGNOSES   Final diagnoses:  Nonspecific chest pain  Palpitations     Rx / DC Orders   ED Discharge Orders     None        Note:  This document was prepared using Dragon voice recognition software and may include unintentional dictation errors.   Blake Divine, MD 09/10/22 [phone]

## 2022-09-10 NOTE — ED Notes (Signed)
Patient transported to X-ray 

## 2022-09-10 NOTE — ED Triage Notes (Addendum)
Pt here with cp that started 2 days ago. Pt states pain is centered and cam radiate to either left or right side when she is laying down. Pt states pain is constant and "feels like an elephant is sitting on my chest". Pt denies NVD. Pt states hx of an enlarged heart. Pt also states she has been off balance for a while and falling more.

## 2022-10-31 ENCOUNTER — Ambulatory Visit (INDEPENDENT_AMBULATORY_CARE_PROVIDER_SITE_OTHER): Payer: 59 | Admitting: Physician Assistant

## 2022-10-31 ENCOUNTER — Encounter: Payer: Self-pay | Admitting: Physician Assistant

## 2022-10-31 VITALS — BP 107/73 | HR 78 | Ht 65.5 in | Wt 212.0 lb

## 2022-10-31 DIAGNOSIS — L308 Other specified dermatitis: Secondary | ICD-10-CM | POA: Diagnosis not present

## 2022-10-31 DIAGNOSIS — F339 Major depressive disorder, recurrent, unspecified: Secondary | ICD-10-CM | POA: Diagnosis not present

## 2022-10-31 DIAGNOSIS — L309 Dermatitis, unspecified: Secondary | ICD-10-CM | POA: Diagnosis not present

## 2022-10-31 DIAGNOSIS — R55 Syncope and collapse: Secondary | ICD-10-CM | POA: Diagnosis not present

## 2022-10-31 DIAGNOSIS — J45909 Unspecified asthma, uncomplicated: Secondary | ICD-10-CM

## 2022-10-31 DIAGNOSIS — E669 Obesity, unspecified: Secondary | ICD-10-CM | POA: Diagnosis not present

## 2022-10-31 DIAGNOSIS — F4310 Post-traumatic stress disorder, unspecified: Secondary | ICD-10-CM | POA: Diagnosis not present

## 2022-10-31 DIAGNOSIS — F419 Anxiety disorder, unspecified: Secondary | ICD-10-CM

## 2022-10-31 DIAGNOSIS — Z0000 Encounter for general adult medical examination without abnormal findings: Secondary | ICD-10-CM

## 2022-10-31 DIAGNOSIS — J302 Other seasonal allergic rhinitis: Secondary | ICD-10-CM | POA: Diagnosis not present

## 2022-10-31 MED ORDER — HYDROXYZINE PAMOATE 25 MG PO CAPS: ORAL_CAPSULE | ORAL | 0 refills | Status: DC

## 2022-10-31 MED ORDER — FLUOCINONIDE 0.05 % EX CREA: 1.0000 | TOPICAL_CREAM | Freq: Two times a day (BID) | CUTANEOUS | 0 refills | Status: DC

## 2022-10-31 MED ORDER — BUSPIRONE HCL 5 MG PO TABS: 5.0000 mg | ORAL_TABLET | Freq: Two times a day (BID) | ORAL | 0 refills | Status: DC

## 2022-10-31 MED ORDER — ALBUTEROL SULFATE HFA 108 (90 BASE) MCG/ACT IN AERS: 2.0000 | INHALATION_SPRAY | Freq: Four times a day (QID) | RESPIRATORY_TRACT | 0 refills | Status: AC | PRN

## 2022-10-31 NOTE — Assessment & Plan Note (Signed)
Chronic, unstable PHQ9 score was 13, moderate depression. Will start on buspar and will reassess in a mo as her anxitety > depression

## 2022-10-31 NOTE — Assessment & Plan Note (Signed)
-   Avoidance measures discussed. - Use nasal saline rinses before nose sprays such as with Neilmed Sinus Rinse bottle.  Use distilled water.   - Use Flonase 2 sprays each nostril daily. Aim upward and outward. - Use Zyrtec 10 mg daily, OTC allegra and claritin

## 2022-10-31 NOTE — Assessment & Plan Note (Addendum)
Chronic, x 2 years Advised: Avoid agents that may cause irritation (e.g., wool, perfumes). Minimize sweating. Avoid alkaline soaps. Hypoallergenic cleansers with a slightly acidic (pH 5-6) with gentle mechanical removal of crusts, scale and bacterial skin contaminants. Sun exposure may be helpful. Humidify the house. Avoid excessive contact with water, use lukewarm water. Frequent systemic lubrication with thick emollient creams (e.g., Eucerin, Vaseline) over moist skin is the mainstay of treatment before any other intervention is considered. The "soak and seal" method is recommended.  Rx higher potency topical corticosteroids , tried triamcinolone 0.1% in the past without relief Antihistamines for pruritus was rx
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
## 2022-10-31 NOTE — Assessment & Plan Note (Signed)
Chronic condition. GAD7 score was 16, severe anxiety Relaxation/mindfulness techniques were recommended. Referrals to psychiatry and psychology were placed Buspar and hydroxyzine were Rx-ed. Fu in a mo

## 2022-10-31 NOTE — Addendum Note (Signed)
Addended by: Shelly Bombard on: 10/31/2022 02:57 PM   Modules accepted: Orders
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
## 2022-11-01 LAB — CBC WITH DIFFERENTIAL/PLATELET
Basophils Absolute: 0.1 10*3/uL (ref 0.0–0.2)
Basos: 1 %
EOS (ABSOLUTE): 0.1 10*3/uL (ref 0.0–0.4)
Eos: 2 %
Hematocrit: 42 % (ref 34.0–46.6)
Hemoglobin: 14.1 g/dL (ref 11.1–15.9)
Immature Grans (Abs): 0 10*3/uL (ref 0.0–0.1)
Immature Granulocytes: 1 %
Lymphocytes Absolute: 2 10*3/uL (ref 0.7–3.1)
Lymphs: 25 %
MCH: 29.5 pg (ref 26.6–33.0)
MCHC: 33.6 g/dL (ref 31.5–35.7)
MCV: 88 fL (ref 79–97)
Monocytes Absolute: 0.5 10*3/uL (ref 0.1–0.9)
Monocytes: 6 %
Neutrophils Absolute: 5.1 10*3/uL (ref 1.4–7.0)
Neutrophils: 65 %
Platelets: 242 10*3/uL (ref 150–450)
RBC: 4.78 x10E6/uL (ref 3.77–5.28)
RDW: 11.9 % (ref 11.7–15.4)
WBC: 7.7 10*3/uL (ref 3.4–10.8)

## 2022-11-01 LAB — LIPID PANEL
Chol/HDL Ratio: 6 ratio — ABNORMAL HIGH (ref 0.0–4.4)
Cholesterol, Total: 254 mg/dL — ABNORMAL HIGH (ref 100–199)
HDL: 42 mg/dL (ref >39)
LDL Chol Calc (NIH): 177 mg/dL — ABNORMAL HIGH (ref 0–99)
Triglycerides: 187 mg/dL — ABNORMAL HIGH (ref 0–149)
VLDL Cholesterol Cal: 35 mg/dL (ref 5–40)

## 2022-11-01 LAB — COMPREHENSIVE METABOLIC PANEL
ALT: 18 IU/L (ref 0–32)
AST: 18 IU/L (ref 0–40)
Albumin/Globulin Ratio: 1.6 (ref 1.2–2.2)
Albumin: 4.4 g/dL (ref 3.9–4.9)
Alkaline Phosphatase: 72 IU/L (ref 44–121)
BUN/Creatinine Ratio: 14 (ref 9–23)
BUN: 11 mg/dL (ref 6–20)
Bilirubin Total: 0.5 mg/dL (ref 0.0–1.2)
CO2: 20 mmol/L (ref 20–29)
Calcium: 9.5 mg/dL (ref 8.7–10.2)
Chloride: 104 mmol/L (ref 96–106)
Creatinine, Ser: 0.76 mg/dL (ref 0.57–1.00)
Globulin, Total: 2.8 g/dL (ref 1.5–4.5)
Glucose: 89 mg/dL (ref 70–99)
Potassium: 4.4 mmol/L (ref 3.5–5.2)
Sodium: 140 mmol/L (ref 134–144)
Total Protein: 7.2 g/dL (ref 6.0–8.5)
eGFR: 104 mL/min/{1.73_m2} (ref >59)

## 2022-11-01 LAB — HEMOGLOBIN A1C
Est. average glucose Bld gHb Est-mCnc: 108 mg/dL
Hgb A1c MFr Bld: 5.4 % (ref 4.8–5.6)

## 2022-11-01 LAB — TSH: TSH: 1.37 u[IU]/mL (ref 0.450–4.500)

## 2022-11-01 NOTE — Progress Notes (Signed)
Hello Deborah Lambert ,   Your labwork results all are within normal limits elevated lipids.  Advise lifestyle modification via low-cholesterol diet and daily exercise.  You could try red rice yeast extract with coenzyme Q10, over-the-counter supplements. Please contact me if you need any medication refills Any questions please reach out to the office or message me on MyChart!  Best, Debera Lat, PA-C

## 2022-11-11 ENCOUNTER — Encounter: Payer: Self-pay | Admitting: Physician Assistant

## 2022-11-11 DIAGNOSIS — L309 Dermatitis, unspecified: Secondary | ICD-10-CM

## 2022-11-12 ENCOUNTER — Ambulatory Visit: Payer: Self-pay | Admitting: *Deleted

## 2022-11-12 ENCOUNTER — Ambulatory Visit (INDEPENDENT_AMBULATORY_CARE_PROVIDER_SITE_OTHER): Payer: 59 | Admitting: Family Medicine

## 2022-11-12 VITALS — BP 112/79 | HR 86 | Ht 65.5 in | Wt 213.0 lb

## 2022-11-12 DIAGNOSIS — R42 Dizziness and giddiness: Secondary | ICD-10-CM | POA: Diagnosis not present

## 2022-11-12 DIAGNOSIS — R601 Generalized edema: Secondary | ICD-10-CM | POA: Diagnosis not present

## 2022-11-12 DIAGNOSIS — M75101 Unspecified rotator cuff tear or rupture of right shoulder, not specified as traumatic: Secondary | ICD-10-CM | POA: Diagnosis not present

## 2022-11-12 DIAGNOSIS — Z114 Encounter for screening for human immunodeficiency virus [HIV]: Secondary | ICD-10-CM | POA: Diagnosis not present

## 2022-11-12 DIAGNOSIS — Z1159 Encounter for screening for other viral diseases: Secondary | ICD-10-CM

## 2022-11-12 NOTE — Assessment & Plan Note (Signed)
Acute, self limiting Encourage stressing and body mechanics to assist Recommend massage to site Cause of numbness down R arm; able to replicate s/s on exam  OK to use NSAIDs and ICE as needed
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
## 2022-11-12 NOTE — Telephone Encounter (Signed)
Summary: 4 new meds, over weekend heart just flying seems almost out of her chest/stoopped all recnt meds   busPIRone (BUSPAR) 5 MG tablet, hydrOXYzine (VISTARIL) 25 MG capsule, pt had to pick up Red Yeast tablets and coQ10, saw dr last week and these new drug/supplements starts, Fri started having her heart beat out of chest, flushed, stopped all 4 meds, wants advise. [phone] pt improved       Chief Complaint: headache, heart beat issues after taking medications. Buspar, vistaril, coQ10 , red yeast rice . Symptoms: after taking above medications x 1 week started having heart beat "like beating out of my chest" became flushed, hot to touch , headache and felt like "lightning bolts in neck and back of head". Stopped medications since Friday 11/09/22.now feeling  Moody, irritable, headaches continue. Vision right eye with black dots , tingling in right fingers on and off  last noted at 0430 this am. Reports almost fell going up stairs. Reports has to go up and down 3 flights of stairs multiple times daily  Frequency: 1 week  Pertinent Negatives: Patient denies chest pain no heart beating out of chest no difficulty breathing no N/T to face one side of body. No slurred speech.  Disposition: [] ED /[] Urgent Care (no appt availability in office) / [x] Appointment(In office/virtual)/ []  [place] [facility]/ [] Home Care/ [] Refused Recommended Disposition /[] [place] [facility]/ []  Follow-up with PCP Additional Notes:   Appt scheduled to day with a provider. PCP not available. Recommended if sx worsen go to ED or call 911.        Reason for Disposition  [1] SEVERE headache (e.g., excruciating) AND [2] not improved after 2 hours of pain medicine  Answer Assessment - Initial Assessment Questions 1. LOCATION: "Where does it hurt?"      Back of neck  2. ONSET: "When did the headache start?" (Minutes, hours or days)      Friday 11/09/22 after taking medications buspar, vistaril red yeast rice  and co Q 10 3. PATTERN: "Does the pain come and go, or has it been constant since it started?"     Na  4. SEVERITY: "How bad is the pain?" and "What does it keep you from doing?"  (e.g., Scale 1-10; mild, moderate, or severe)   - MILD (1-3): doesn't interfere with normal activities    - MODERATE (4-7): interferes with normal activities or awakens from sleep    - SEVERE (8-10): excruciating pain, unable to do any normal activities        Headache  5. RECURRENT SYMPTOM: "Have you ever had headaches before?" If Yes, ask: "When was the last time?" and "What happened that time?"      No  6. CAUSE: "What do you think is causing the headache?"     medications 7. MIGRAINE: "Have you been diagnosed with migraine headaches?" If Yes, ask: "Is this headache similar?"      na 8. HEAD INJURY: "Has there been any recent injury to the head?"      Na  9. OTHER SYMPTOMS: "Do you have any other symptoms?" (fever, stiff neck, eye pain, sore throat, cold symptoms)     Moody , irritable, headache back of neck, seeing black spots right eye, tingling right fingers on and off 10. PREGNANCY: "Is there any chance you are pregnant?" "When was your last menstrual period?"       na  Protocols used: [facility]

## 2022-11-12 NOTE — Assessment & Plan Note (Signed)
Acute, in setting of panic while driving Recommend restart of buspar at 2.5 mg once as needed; to titrate other symptoms based on side effects Unknown cause with use of supplements coq10, red yeast rice, and new start of buspar and atarax Hold all meds outside of buspar; reach back out as needed

## 2022-11-12 NOTE — Assessment & Plan Note (Signed)
Low risk screen Treatable, and curable. If left untreated Hep C can lead to cirrhosis and liver failure. Encourage routine testing; recommend repeat testing if risk factors change.  

## 2022-11-12 NOTE — Assessment & Plan Note (Signed)
Chronic, ongoing  for "years" BP stable Non pitting Reports edema in all extremities No previous workup Recommend CRP to start Continue to be mindful of diet as we hold supps for HLD recommend diet low in saturated fat and regular exercise - 30 min at least 5 times per week Continue to recommend balanced, lower carb meals. Smaller meal size, adding snacks. Choosing water as drink of choice and increasing purposeful exercise.

## 2022-11-12 NOTE — Assessment & Plan Note (Signed)
Low risk screen ?Consented; encouraged to "know your status" ?Recommend repeat screen if risk factors change ? ?

## 2022-11-13 DIAGNOSIS — R601 Generalized edema: Secondary | ICD-10-CM | POA: Diagnosis not present

## 2022-11-13 DIAGNOSIS — R42 Dizziness and giddiness: Secondary | ICD-10-CM | POA: Diagnosis not present

## 2022-11-13 DIAGNOSIS — Z1159 Encounter for screening for other viral diseases: Secondary | ICD-10-CM | POA: Diagnosis not present

## 2022-11-13 DIAGNOSIS — Z114 Encounter for screening for human immunodeficiency virus [HIV]: Secondary | ICD-10-CM | POA: Diagnosis not present

## 2022-11-14 LAB — BASIC METABOLIC PANEL
BUN/Creatinine Ratio: 14 (ref 9–23)
BUN: 11 mg/dL (ref 6–20)
CO2: 20 mmol/L (ref 20–29)
Calcium: 9.2 mg/dL (ref 8.7–10.2)
Chloride: 105 mmol/L (ref 96–106)
Creatinine, Ser: 0.78 mg/dL (ref 0.57–1.00)
Glucose: 105 mg/dL — ABNORMAL HIGH (ref 70–99)
Potassium: 4.2 mmol/L (ref 3.5–5.2)
Sodium: 140 mmol/L (ref 134–144)
eGFR: 101 mL/min/{1.73_m2} (ref >59)

## 2022-11-14 LAB — HEPATITIS C ANTIBODY: Hep C Virus Ab: NONREACTIVE

## 2022-11-14 LAB — HIV ANTIBODY (ROUTINE TESTING W REFLEX): HIV Screen 4th Generation wRfx: NONREACTIVE

## 2022-11-14 LAB — C-REACTIVE PROTEIN: CRP: 9 mg/L (ref 0–10)

## 2022-11-14 NOTE — Progress Notes (Signed)
Labs remain stable; no signs of inflammation. Continue to monitor sodium in diet and water intake in terms of swelling.  Jacky Kindle, FNP  [facility] [address] [place], [place] [address] [phone] (phone) [phone] (fax) [facility]

## 2022-11-22 ENCOUNTER — Other Ambulatory Visit: Payer: Self-pay | Admitting: Physician Assistant

## 2022-11-22 DIAGNOSIS — R55 Syncope and collapse: Secondary | ICD-10-CM

## 2022-11-22 NOTE — Addendum Note (Signed)
Addended by: Debera Lat on: 11/22/2022 01:47 PM   Modules accepted: Orders

## 2022-11-30 MED ORDER — FLUOCINONIDE 0.05 % EX CREA: 1.0000 | TOPICAL_CREAM | Freq: Two times a day (BID) | CUTANEOUS | 1 refills | Status: DC

## 2022-12-03 NOTE — Progress Notes (Deleted)
      Established patient visit   Patient: Deborah Lambert   DOB: Apr 14, 1986   37 y.o. Female  MRN: [id number] Visit Date: 12/04/2022  Today's healthcare provider: Debera Lat, PA-C   No chief complaint on file.  Subjective    HPI  Anxiety, Follow-up  She was last seen for anxiety 1 months ago. Changes made at last visit include started patient on Buspar and Hydroxyzine with referrals to psychiatry and psycology.   She reports {excellent/good/fair/poor:19665} compliance with treatment. She reports {good/fair/poor:18685} tolerance of treatment. She {is/is not:21021397} having side effects. {document side effects if present:1}  She feels her anxiety is {Desc; severity:60313} and {improved/worse/unchanged:3041574} since last visit.  Symptoms: {Yes/No:20286} chest pain {Yes/No:20286} difficulty concentrating  {Yes/No:20286} dizziness {Yes/No:20286} fatigue  {Yes/No:20286} feelings of losing control {Yes/No:20286} insomnia  {Yes/No:20286} irritable {Yes/No:20286} palpitations  {Yes/No:20286} panic attacks {Yes/No:20286} racing thoughts  {Yes/No:20286} shortness of breath {Yes/No:20286} sweating  {Yes/No:20286} tremors/shakes    GAD-7 Results    10/31/2022    9:50 AM  GAD-7 Generalized Anxiety Disorder Screening Tool  1. Feeling Nervous, Anxious, or on Edge 1  2. Not Being Able to Stop or Control Worrying 3  3. Worrying Too Much About Different Things 3  4. Trouble Relaxing 3  5. Being So Restless it's Hard To Sit Still 3  6. Becoming Easily Annoyed or Irritable 3  7. Feeling Afraid As If Something Awful Might Happen 0  Total GAD-7 Score 16  Difficulty At Work, Home, or Getting  Along With Others? Somewhat difficult    PHQ-9 Scores    11/12/2022    4:08 PM 10/31/2022    9:47 AM  PHQ9 SCORE ONLY  PHQ-9 Total Score 5 13    ---------------------------------------------------------------------------------------------------   Medications: Outpatient Medications  Prior to Visit  Medication Sig   albuterol (VENTOLIN HFA) 108 (90 Base) MCG/ACT inhaler Inhale 2 puffs into the lungs every 6 (six) hours as needed for wheezing or shortness of breath. Inhale into the lungs every 6 (six) hours as needed for wheezing or shortness of breath.   busPIRone (BUSPAR) 5 MG tablet Take 1 tablet (5 mg total) by mouth 2 (two) times daily.   fluocinonide cream (LIDEX) 0.05 % Apply 1 Application topically 2 (two) times daily.   hydrOXYzine (VISTARIL) 25 MG capsule 1 tab at bedtime for itchiness and anxiety   triamcinolone (KENALOG) 0.025 % ointment Apply 1 application topically 2 (two) times daily. To both hands.   No facility-administered medications prior to visit.    Review of Systems  {Labs  Heme  Chem  Endocrine  Serology  Results Review (optional):23779}   Objective    LMP 10/22/2022  {Show previous vital signs (optional):23777}  Physical Exam  ***  No results found for any visits on 12/04/22.  Assessment & Plan     ***  No follow-ups on file.      {provider attestation***:1}   Debera Lat, PA-C  [facility] [phone] (phone) [phone] (fax)  [facility]

## 2022-12-04 ENCOUNTER — Ambulatory Visit: Payer: 59 | Admitting: Physician Assistant

## 2022-12-17 ENCOUNTER — Telehealth: Payer: Self-pay

## 2022-12-17 NOTE — Telephone Encounter (Signed)
Copied from CRM [id number]. Topic: Referral - Status >> Dec 17, 2022 11:16 AM Macon Large wrote: Reason for CRM: Pt stated she contacted the dermatologist office that she was referred to but she is being told that they have not received a referral for her. Pt requests that a referral be sent asap because her hands are bleeding daily and it is very painful.

## 2022-12-18 NOTE — Telephone Encounter (Signed)
Pt called back requesting follow up for referral needs  Best contact: [phone]

## 2022-12-20 ENCOUNTER — Telehealth: Payer: Self-pay

## 2022-12-20 NOTE — Telephone Encounter (Signed)
Copied from CRM [id number]. Topic: General - Other >> Dec 20, 2022 11:56 AM Macon Large wrote: Reason for CRM: Pt reports that she spoke with the dermatologist office and the lady that she spoke with must have been having a bad day because she was snapping off at her. Pt reports that she snapped back at the lady and wants to make the office aware of the situation. Pt requests call back to discuss. Cb# [phone]

## 2022-12-22 ENCOUNTER — Ambulatory Visit
Admission: EM | Admit: 2022-12-22 | Discharge: 2022-12-22 | Disposition: A | Discharge status: Home / Self Care | Payer: 59 | Attending: Family Medicine | Admitting: Family Medicine

## 2022-12-22 DIAGNOSIS — W57XXXA Bitten or stung by nonvenomous insect and other nonvenomous arthropods, initial encounter: Secondary | ICD-10-CM

## 2022-12-22 DIAGNOSIS — R21 Rash and other nonspecific skin eruption: Secondary | ICD-10-CM | POA: Diagnosis not present

## 2022-12-22 MED ORDER — TRIAMCINOLONE ACETONIDE 0.5 % EX OINT: 1.0000 | TOPICAL_OINTMENT | Freq: Two times a day (BID) | CUTANEOUS | 0 refills | Status: DC

## 2022-12-22 MED ORDER — MUPIROCIN 2 % EX OINT: 1.0000 | TOPICAL_OINTMENT | Freq: Three times a day (TID) | CUTANEOUS | 0 refills | Status: AC

## 2022-12-22 NOTE — Discharge Instructions (Addendum)
No evidence of tickborne illness at this time.  Please look out for additional symptoms: Fever, chills, joint aches and pains, rash.  Use the medications as directed.  Testing not indicated at this time as it is unlikely to be fruitful as the tick bites just occurred.  Follow-up with your primary care provider.

## 2022-12-22 NOTE — ED Provider Notes (Signed)
[facility]    CSN: [id number] Arrival date & time: 12/22/22  1112      History   Chief Complaint Chief Complaint  Patient presents with   Insect Bite    HPI  37 year old female presents for evaluation of tick bite.  Patient reports that she was bitten by a tick 2 days ago.  She reports 1 bite to the left ear and 1 to the back of the right leg.  She states that she has a history of Lyme disease.  No fever.  She states that it is mildly uncomfortable.  Itches.  No relieving factors.  No other complaints.  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      Home Medications    Prior to Admission medications   Medication Sig Start Date End Date Taking? Authorizing Provider  albuterol (VENTOLIN HFA) 108 (90 Base) MCG/ACT inhaler Inhale 2 puffs into the lungs every 6 (six) hours as needed for wheezing or shortness of breath. Inhale into the lungs every 6 (six) hours as needed for wheezing or shortness of breath. 10/31/22  Yes Ostwalt, Edmon Crape, PA-C  busPIRone (BUSPAR) 5 MG tablet Take 1 tablet (5 mg total) by mouth 2 (two)  times daily. 10/31/22  Yes Ostwalt, Edmon Crape, PA-C  fluocinonide cream (LIDEX) 0.05 % Apply 1 Application topically 2 (two) times daily. 11/30/22  Yes Ostwalt, Edmon Crape, PA-C  hydrOXYzine (VISTARIL) 25 MG capsule 1 tab at bedtime for itchiness and anxiety 10/31/22  Yes Ostwalt, Janna, PA-C  mupirocin ointment (BACTROBAN) 2 % Apply 1 Application topically 3 (three) times daily for 7 days. 12/22/22 12/29/22 Yes Ayo Smoak G, DO  triamcinolone ointment (KENALOG) 0.5 % Apply 1 Application topically 2 (two) times daily. 12/22/22  Yes Ashutosh Dieguez G, DO  omeprazole (PRILOSEC) 20 MG capsule Take 1 capsule (20 mg total) by mouth daily. 06/01/19 07/31/19  Tanda Rockers, PA-C    Family History Family History  Problem Relation Age of Onset   Stroke Mother    Diabetes Father     Social History Social History   Tobacco Use   Smoking status: Never   Smokeless tobacco: Never  Vaping Use   Vaping Use: Never used  Substance Use Topics   Alcohol use: Never   Drug use: Never     Allergies   Aspirin, Codeine, Aspirin, Codeine, Almond oil, and Coconut (cocos nucifera)   Review of Systems Review of Systems Per HPI  Physical Exam Triage Vital Signs ED Triage Vitals  Enc Vitals Group     BP 12/22/22 1122 115/83  Pulse Rate 12/22/22 1122 76     Resp --      Temp 12/22/22 1122 98.1 F (36.7 C)     Temp Source 12/22/22 1122 Oral     SpO2 12/22/22 1122 96 %     Weight 12/22/22 1121 200 lb (90.7 kg)     Height 12/22/22 1121 5\' 5"  (1.651 m)     Head Circumference --      Peak Flow --      Pain Score 12/22/22 1121 0     Pain Loc --      Pain Edu? --      Excl. in GC? --    No data found.  Updated Vital Signs BP 115/83 (BP Location: Left Arm)   Pulse 76   Temp 98.1 F (36.7 C) (Oral)   Ht 5\' 5"  (1.651 m)   Wt 90.7 kg   LMP 12/08/2022   SpO2 96%   BMI 33.28 kg/m   Visual Acuity Right Eye Distance:   Left Eye Distance:   Bilateral Distance:    Right Eye Near:   Left Eye Near:     Bilateral Near:     Physical Exam Constitutional:      General: She is not in acute distress.    Appearance: Normal appearance.  HENT:     Head: Normocephalic and atraumatic.     Ears:      Comments: Mild erythematous lab location. Cardiovascular:     Rate and Rhythm: Normal rate and regular rhythm.  Pulmonary:     Effort: Pulmonary effort is normal. No respiratory distress.  Skin:    Comments: Posterior right leg with a small area of erythema.  There is scant serous drainage.  Neurological:     Mental Status: She is alert.      UC Treatments / Results  Labs (all labs ordered are listed, but only abnormal results are displayed) Labs Reviewed - No data to display  EKG   Radiology No results found.  Procedures Procedures (including critical care time)  Medications Ordered in UC Medications - No data to display  Initial Impression / Assessment and Plan / UC Course  I have reviewed the triage vital signs and the nursing notes.  Pertinent labs & imaging results that were available during my care of the patient were reviewed by me and considered in my medical decision making (see chart for details).    37 year old female presents with recent tick bite.  There is no evidence of tickborne illness.  Advised to watch out for symptoms.  Topical Bactroban and topical triamcinolone as directed.  Final Clinical Impressions(s) / UC Diagnoses   Final diagnoses:  Tick bite, unspecified site, initial encounter  Rash and nonspecific skin eruption     Discharge Instructions      No evidence of tickborne illness at this time.  Please look out for additional symptoms: Fever, chills, joint aches and pains, rash.  Use the medications as directed.  Testing not indicated at this time as it is unlikely to be fruitful as the tick bites just occurred.  Follow-up with your primary care provider.   ED Prescriptions     Medication Sig Dispense Auth. Provider   mupirocin  ointment (BACTROBAN) 2 % Apply 1 Application topically 3 (three) times daily for 7 days. 30 g Trea Carnegie G, DO   triamcinolone ointment (KENALOG) 0.5 % Apply 1 Application topically 2 (two) times daily. 30 g Tommie Sams, [place]  PDMP not reviewed this encounter.   Tommie Sams, [place] 12/22/22 1615

## 2022-12-22 NOTE — ED Triage Notes (Signed)
Pt is with her husband  Pt c/o insect bite on back of right leg and corner of left ear.  Pt states that on the back of the leg she was bruised but it has now formed a "pimple and has a head on it"

## 2022-12-28 ENCOUNTER — Ambulatory Visit (HOSPITAL_COMMUNITY): Payer: 59 | Admitting: Psychiatry

## 2023-01-23 ENCOUNTER — Emergency Department: Payer: 59

## 2023-01-23 ENCOUNTER — Ambulatory Visit: Payer: Self-pay | Admitting: *Deleted

## 2023-01-23 ENCOUNTER — Other Ambulatory Visit: Payer: Self-pay

## 2023-01-23 ENCOUNTER — Emergency Department
Admission: EM | Admit: 2023-01-23 | Discharge: 2023-01-23 | Disposition: A | Discharge status: Home / Self Care | Payer: 59 | Attending: Emergency Medicine | Admitting: Emergency Medicine

## 2023-01-23 DIAGNOSIS — M25532 Pain in left wrist: Secondary | ICD-10-CM | POA: Diagnosis not present

## 2023-01-23 DIAGNOSIS — S63502A Unspecified sprain of left wrist, initial encounter: Secondary | ICD-10-CM | POA: Insufficient documentation

## 2023-01-23 DIAGNOSIS — S6992XA Unspecified injury of left wrist, hand and finger(s), initial encounter: Secondary | ICD-10-CM | POA: Diagnosis not present

## 2023-01-23 DIAGNOSIS — W010XXA Fall on same level from slipping, tripping and stumbling without subsequent striking against object, initial encounter: Secondary | ICD-10-CM | POA: Insufficient documentation

## 2023-01-23 DIAGNOSIS — S63592A Other specified sprain of left wrist, initial encounter: Secondary | ICD-10-CM | POA: Diagnosis not present

## 2023-01-23 MED ORDER — MELOXICAM 15 MG PO TABS: 15.0000 mg | ORAL_TABLET | Freq: Every day | ORAL | 0 refills | Status: DC

## 2023-01-23 MED ORDER — KETOROLAC TROMETHAMINE 30 MG/ML IJ SOLN
30.0000 mg | Freq: Once | INTRAMUSCULAR | Status: AC
  Administered 2023-01-23: 30 mg via INTRAMUSCULAR
  Filled 2023-01-23: qty 1

## 2023-01-23 NOTE — Telephone Encounter (Signed)
  Chief Complaint: fell on left wrist in rain 3 days ago  Symptoms: swelling , bruising, left wrist. Severe pain. Hand N/T fell on concrete falling on left wrist  Frequency: 3 days ago  Pertinent Negatives: Patient denies open wound  Disposition: [x] ED /[] Urgent Care (no appt availability in office) / [] Appointment(In office/virtual)/ []  [place] [facility]/ [] Home Care/ [] Refused Recommended Disposition /[]  Mobile Bus/ []  Follow-up with PCP Additional Notes:   Recommended ED due to N/T. Patient would like to know from PCP if ED is best option. Please advise    Reason for Disposition  [1] Numbness (loss of sensation) of finger(s) AND [2] present now  Answer Assessment - Initial Assessment Questions 1. MECHANISM: "How did the injury happen?"     Fell 3 days ago in rain over concrete side walk 2. ONSET: "When did the injury happen?" (Minutes or hours ago)      3 days ago  3. APPEARANCE of INJURY: "What does the injury look like?"      Swelling, bruising left wrist hand swelling  4. SEVERITY: "Can you use the hand normally?" "Can you bend your fingers into a ball and then fully open them?"     No  5. SIZE: For cuts, bruises, or swelling, ask: "How large is it?" (e.g., inches or centimeters;  entire hand or wrist)      Swelling , bruising left wrist  6. PAIN: "Is there pain?" If Yes, ask: "How bad is the pain?"  (Scale 1-10; or mild, moderate, severe)     Severe  7. TETANUS: For any breaks in the skin, ask: "When was the last tetanus booster?"     Na  8. OTHER SYMPTOMS: "Do you have any other symptoms?"      N/T  9. PREGNANCY: "Is there any chance you are pregnant?" "When was your last menstrual period?"     na  Protocols used: Hand and Wrist Injury-A-AH

## 2023-01-23 NOTE — ED Triage Notes (Signed)
Pt sts that she fell and since than she has been having left wrist pain. No deformity noted.

## 2023-01-23 NOTE — ED Provider Notes (Signed)
[facility] Provider Note  Patient Contact: 4:46 PM (approximate)   History   Arm Injury   HPI  Deborah Lambert is a 37 y.o. female who presents emergency department complaining of left wrist pain.  Patient states that she was walking around some concrete that was wet from the rain couple days ago, slipped and fell onto an outstretched hand.  Felt a pop in her wrist and has had pain since.  No history of previous injuries to the wrist.  Patient reports some mild edema but no deformity.  Still able to move all digits.  No open wounds reported.  No other injury or complaint.     Physical Exam   Triage Vital Signs: ED Triage Vitals  Encounter Vitals Group     BP 01/23/23 1414 129/87     Systolic BP Percentile --      Diastolic BP Percentile --      Pulse Rate 01/23/23 1414 84     Resp 01/23/23 1414 18     Temp 01/23/23 1414 97.8 F (36.6 C)     Temp Source 01/23/23 1414 Oral     SpO2 01/23/23 1414 97 %     Weight 01/23/23 1415 200 lb (90.7 kg)     Height 01/23/23 1415 5\' 6"  (1.676 m)     Head Circumference --      Peak Flow --      Pain Score 01/23/23 1415 10     Pain Loc --      Pain Education --      Exclude from Growth Chart --     Most recent vital signs: Vitals:   01/23/23 1414  BP: 129/87  Pulse: 84  Resp: 18  Temp: 97.8 F (36.6 C)  SpO2: 97%     General: Alert and in no acute distress.  Cardiovascular:  Good peripheral perfusion Respiratory: Normal respiratory effort without tachypnea or retractions. Lungs CTAB.  Musculoskeletal: Full range of motion to all extremities.  Visualization of the left wrist reveals mild edema when compared with right.  No open wounds.  Tender diffusely about the wrist joint, slightly worse over the distal ulna versus radial side of the wrist.  No palpable deficits or deformity.  Patient is able to move all digits appropriately.  Radial pulse, cap refill intact.  Sensation intact all  digits. Neurologic:  No gross focal neurologic deficits are appreciated.  Skin:   No rash noted Other:   ED Results / Procedures / Treatments   Labs (all labs ordered are listed, but only abnormal results are displayed) Labs Reviewed - No data to display   EKG     RADIOLOGY  I personally viewed, evaluated, and interpreted these images as part of my medical decision making, as well as reviewing the written report by the radiologist.  ED Provider Interpretation: No acute traumatic finding, with fracture or dislocation of the wrist.  DG Wrist Complete Left  Result Date: 01/23/2023 CLINICAL DATA:  Fall 2 days ago.  Pain. EXAM: LEFT WRIST - COMPLETE 3+ VIEW COMPARISON:  Left forearm radiographs 12/10/2017 FINDINGS: Normal bone mineralization. Neutral ulnar variance. Joint spaces are preserved. No acute fracture or dislocation. IMPRESSION: No acute fracture or dislocation. Electronically Signed   By: Neita Garnet M.D.   On: 01/23/2023 15:21    PROCEDURES:  Critical Care performed: No  Procedures   MEDICATIONS ORDERED IN ED: Medications - No data to display   IMPRESSION / MDM / ASSESSMENT AND  PLAN / ED COURSE  I reviewed the triage vital signs and the nursing notes.                                 Differential diagnosis includes, but is not limited to, sprain, wrist contusion, wrist fracture   Patient's presentation is most consistent with acute presentation with potential threat to life or bodily function.   Patient's diagnosis is consistent with sprain.  Patient presents emergency department after slipping and falling onto an outstretched hand.  Patient did not hit her head or lose consciousness.  Patient's main complaint is wrist pain more on the medial aspect over the ulna.  No palpable abnormality.  Findings on x-ray are reassuring with no acute traumatic finding..  Anti-inflammatory and splint for symptom improvement.  Follow-up with orthopedics as needed.  Patient  is given ED precautions to return to the ED for any worsening or new symptoms.     FINAL CLINICAL IMPRESSION(S) / ED DIAGNOSES   Final diagnoses:  None     Rx / DC Orders   ED Discharge Orders     None        Note:  This document was prepared using Dragon voice recognition software and may include unintentional dictation errors.   Lanette Hampshire 01/23/23 1723    Minna Antis, MD 01/23/23 1900

## 2023-01-24 NOTE — Telephone Encounter (Signed)
FYI Tried calling patient yesterday but did not get an answer. Called this morning and she reports:  Patient seen in ER yesterday.  Told she probably had a sprain with possible tear of ligament.  They recommended she see ortho if not improving.   Spoke to patient this morning and she states she is still in significant pain.  Recommended she go to [facility] walk in clinic if pain continued to be so severe.  Patient verbalized understanding

## 2023-01-29 ENCOUNTER — Telehealth: Payer: Self-pay | Admitting: *Deleted

## 2023-01-29 NOTE — Transitions of Care (Post Inpatient/ED Visit) (Signed)
   01/29/2023  Name: Deborah Lambert MRN: [id number] DOB: [birth year]  Today's TOC FU Call Status: Today's TOC FU Call Status:: Unsuccessul Call (1st Attempt) Unsuccessful Call (1st Attempt) Date: 01/29/23  Attempted to reach the patient regarding the most recent Inpatient/ED visit.  Follow Up Plan: Additional outreach attempts will be made to reach the patient to complete the Transitions of Care (Post Inpatient/ED visit) call.   Gean Maidens BSN RN [facility] [phone]

## 2023-01-31 ENCOUNTER — Telehealth: Payer: Self-pay

## 2023-01-31 DIAGNOSIS — J4540 Moderate persistent asthma, uncomplicated: Secondary | ICD-10-CM | POA: Diagnosis not present

## 2023-01-31 DIAGNOSIS — R052 Subacute cough: Secondary | ICD-10-CM | POA: Diagnosis not present

## 2023-01-31 DIAGNOSIS — L2089 Other atopic dermatitis: Secondary | ICD-10-CM | POA: Diagnosis not present

## 2023-01-31 DIAGNOSIS — J3089 Other allergic rhinitis: Secondary | ICD-10-CM | POA: Diagnosis not present

## 2023-01-31 NOTE — Telephone Encounter (Signed)
..   Medicaid Managed Care   Unsuccessful Outreach Note  01/31/2023 Name: Deborah Lambert MRN: [id number] DOB: [birth year]  Referred by: Debera Lat, PA-C Reason for referral : Appointment   An unsuccessful telephone outreach was attempted today. The patient was referred to the case management team for assistance with care management and care coordination.   Follow Up Plan: A HIPAA compliant phone message was left for the patient providing contact information and requesting a return call.  The care management team will reach out to the patient again over the next 7 days.   Weston Settle Care Guide  [facility] Managed  Care Guide [facility]  [phone]

## 2023-02-11 ENCOUNTER — Other Ambulatory Visit: Payer: 59 | Admitting: Obstetrics and Gynecology

## 2023-02-11 ENCOUNTER — Encounter: Payer: Self-pay | Admitting: Obstetrics and Gynecology

## 2023-02-11 NOTE — Patient Outreach (Signed)
Medicaid Managed Care   Nurse Care Manager Note  02/11/2023 Name:  Deborah Lambert MRN:  [id number] DOB:  1986/06/27  Deborah Lambert is an 37 y.o. year old female who is a primary patient of Debera Lambert, [place].  The [facility] Managed Care Coordination team was consulted for assistance with:    Chronic healthcare management needs,  Asthma, eczema, obesity, anxiety, PTSD, depression, allergies  Deborah Lambert was given information about Medicaid Managed Care Coordination team services today. Deborah Lambert Patient agreed to services and verbal consent obtained.  Engaged with patient by telephone for initial visit in response to provider referral for case management and/or care coordination services.   Assessments/Interventions:  Review of past medical history, allergies, medications, health status, including review of consultants reports, laboratory and other test data, was performed as part of comprehensive evaluation and provision of chronic care management services.  SDOH (Social Determinants of Health) assessments and interventions performed: SDOH Interventions    Flowsheet Row Patient Outreach Telephone from 02/11/2023 in [place] [facility] Office Visit from 10/31/2022 in [facility]  SDOH Interventions    Utilities Interventions Intervention Not Indicated --  Depression Interventions/Treatment  -- Medication, Referral to Psychiatry  Health Literacy Interventions Intervention Not Indicated --     Care Plan Allergies  Allergen Reactions   Aspirin Shortness Of Breath and Rash   Codeine Shortness Of Breath and Rash   Aspirin Hives   Codeine Hives   Almond Oil Rash   Coconut (Cocos Nucifera) Rash   Medications Reviewed Today     Reviewed by Danie Chandler, RN (Registered Nurse) on 02/11/23 at 1348  Med List Status: <None>   Medication Order Taking? Sig Documenting Provider Last Dose Status Informant  albuterol (VENTOLIN HFA)  108 (90 Base) MCG/ACT inhaler [id number] No Inhale 2 puffs into the lungs every 6 (six) hours as needed for wheezing or shortness of breath. Inhale into the lungs every 6 (six) hours as needed for wheezing or shortness of breath.  Patient not taking: Reported on 02/11/2023   Debera Lat, PA-C Not Taking Active   busPIRone (BUSPAR) 5 MG tablet [id number] Yes Take 1 tablet (5 mg total) by mouth 2 (two) times daily. Debera Lat, PA-C Taking Active   fluocinonide cream (LIDEX) 0.05 % [id number] No Apply 1 Application topically 2 (two) times daily.  Patient not taking: Reported on 02/11/2023   Debera Lat, PA-C Not Taking Active   hydrOXYzine (VISTARIL) 25 MG capsule [id number] No 1 tab at bedtime for itchiness and anxiety  Patient not taking: Reported on 02/11/2023   Debera Lat, PA-C Not Taking Active   meloxicam (MOBIC) 15 MG tablet [id number] Yes Take 1 tablet (15 mg total) by mouth daily. Cuthriell, Delorise Royals, PA-C Taking Active   Multiple Vitamin (MULTIVITAMIN) tablet [id number] Yes Take 1 tablet by mouth daily. [provider]  Active Self    Discontinued 07/31/19 1832   triamcinolone ointment (KENALOG) 0.5 % [id number] No Apply 1 Application topically 2 (two) times daily.  Patient not taking: Reported on 02/11/2023   Tommie Sams, DO Not Taking Active            Patient Active Problem List   Diagnosis Date Noted   Generalized edema 11/12/2022   Dizziness 11/12/2022   Encounter for screening for HIV 11/12/2022   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/12/2022   Encounter for hepatitis C screening test for low risk patient 11/12/2022   Obesity (BMI 30-39.9) 10/31/2022  Anxiety 10/31/2022   Uncomplicated asthma 10/31/2022   Eczema of both hands 10/31/2022   PTSD (post-traumatic stress disorder) 10/31/2022   Encounter for medical examination to establish care 10/31/2022   Seasonal allergies 10/31/2022   Depression, recurrent (HCC) 10/31/2022   Conditions to be  addressed/monitored per PCP order:  Asthma, eczema, obesity, anxiety, PTSD, depression, allergies,   Care Plan : RN Care Manager Plan of Care  Updates made by Danie Chandler, RN since 02/11/2023 12:00 AM     Problem: Health Promotion or Disease Self-Management (General Plan of Care)      Long-Range Goal: Chronic Disease Management and Care Coordination Needs   Start Date: 02/11/2023  Expected End Date: 05/14/2023  Priority: High  Note:   Current Barriers:  Knowledge Deficits related to plan of care for management of Asthma, eczema, obesity, anxiety, PTSD, depression, allergies Care Coordination needs related to Psychologist/Psychiatrist referral Chronic Disease Management support and education needs related to Asthma, eczema, obesity, anxiety, PTSD, depression, allergies 02/11/23:  Patient seen by [facility] today-Dr. Hall-for eczema on both hands-new cream prescribed.  Patient never received referral to Psychology/Psychiatry by PCP in May-LCSW referral placed  for assistance with finding a provider.  Swelling in legs, face, fingers-discussed low sodium diet  RNCM Clinical Goal(s):  Patient will verbalize understanding of plan for management of Asthma, eczema, obesity, anxiety, PTSD, depression, allergies as evidenced by patient report verbalize basic understanding of Asthma, eczema, obesity, anxiety, PTSD, depression, allergies disease process and self health management plan as evidenced by patient report take all medications exactly as prescribed and will call provider for medication related questions as evidenced by patient report demonstrate understanding of rationale for each prescribed medication as evidenced by patient report attend all scheduled medical appointments  as evidenced by patient report and EMR review. Demonstrate ongoing  adherence to prescribed treatment plan for Asthma, eczema, obesity, anxiety, PTSD, depression, allergies  as evidenced by patient report and EMR  review. continue to work with RN Care Manager to address care management and care coordination needs related to Asthma, eczema, obesity, anxiety, PTSD, depression, allergies as evidenced by adherence to CM Team Scheduled appointments work with Child psychotherapist to address  related to the management of anxiety, depression, PTSD, Bipolar related to the management of Asthma, eczema, obesity, anxiety, PTSD, depression, allergies as evidenced by review of EMR and patient or Child psychotherapist report through collaboration with Medical illustrator, provider, and care team.   Interventions: Inter-disciplinary care team collaboration (see longitudinal plan of care) Evaluation of current treatment plan related to  self management and patient's adherence to plan as established by provider Collaborated with LCSW LCSW referral for anxiety, depression, PTSD, BP  Asthma: (Status:New goal.) Long Term Goal Discussed the importance of adequate rest and management of fatigue with Asthma Assessed social determinant of health barriers   Patient Goals/Self-Care Activities: Take all medications as prescribed Attend all scheduled provider appointments Call pharmacy for medication refills 3-7 days in advance of running out of medications Perform all self care activities independently  Perform IADL's (shopping, preparing meals, housekeeping, managing finances) independently Call provider office for new concerns or questions  Work with the social worker to address care coordination needs and will continue to work with the clinical team to address health care and disease management related needs 02/11/23:  Patient to contact [facility] for dental providers in her area-declined BSW referral  Follow Up Plan:  The patient has been provided with contact information for the care management team and has  been advised to call with any health related questions or concerns.  The care management team will reach out to the patient again over the  next 30 business  days.   Long-Range Goal: Establish Plan of Care for Chronic Disease Management and Care Coordination Needs   Priority: High  Note:   Timeframe:  Long-Range Goal Priority:  High Start Date:  02/11/23                           Expected End Date:                       Follow Up Date 03/14/23    - prevent colds and flu by washing hands, covering coughs and sneezes, getting enough rest - schedule and keep appointment for annual check-up    Why is this important?   Screening tests can find problems  The doctor or nurse will talk with you about which tests are important.    Follow Up:  Patient agrees to Care Plan and Follow-up.  Plan: The Managed Medicaid care management team will reach out to the patient again over the next 30 business  days. and The  Patient has been provided with contact information for the Managed Medicaid care management team and has been advised to call with any health related questions or concerns.  Date/time of next scheduled RN care management/care coordination outreach: 03/14/23 at 1030

## 2023-02-11 NOTE — Patient Instructions (Signed)
Hi Deborah Lambert, thank you for speaking with me today-have a great afternoon!  Deborah Lambert was given information about Medicaid Managed Care team care coordination services as a part of their [facility] Community Plan Medicaid benefit. Deborah Lambert verbally consented to engagement with the [facility] Managed Care team.   If you are experiencing a medical emergency, please call 911 or report to your local emergency department or urgent care.   If you have a non-emergency medical problem during routine business hours, please contact your provider's office and ask to speak with a nurse.   For questions related to your [facility], please call: [phone] or visit the homepage here: [email or web address]  If you would like to schedule transportation through your [facility], please call the following number at least 2 days in advance of your appointment: [phone]   Rides for urgent appointments can also be made after hours by calling Member Services.  Call the Behavioral Health Crisis Line at [phone], at any time, 24 hours a day, 7 days a week. If you are in danger or need immediate medical attention call 911.  If you would like help to quit smoking, call 1-800-QUIT-NOW (539-730-3701) OR Espaol: 1-855-Djelo-Ya (2-423-536-1443) o para ms informacin haga clic aqu or Text READY to 154-008 to register via text  Deborah Lambert - following are the goals we discussed in your visit today:   Goals Addressed    Timeframe:  Long-Range Goal Priority:  High Start Date:  02/11/23                           Expected End Date:                       Follow Up Date 03/14/23    - prevent colds and flu by washing hands, covering coughs and sneezes, getting enough rest - schedule and keep appointment for annual check-up    Why is this important?   Screening tests can find problems  The  doctor or nurse will talk with you about which tests are important.   Patient verbalizes understanding of instructions and care plan provided today and agrees to view in MyChart. Active MyChart status and patient understanding of how to access instructions and care plan via MyChart confirmed with patient.     The Managed Medicaid care management team will reach out to the patient again over the next 30 business  days.  The  Patient has been provided with contact information for the Managed Medicaid care management team and has been advised to call with any health related questions or concerns.   Kathi Der RN, BSN [place]  [facility] Care Management Coordinator - Managed Medicaid High Risk [phone]  Following is a  copy of your plan of care:  Care Plan : RN Care Manager Plan of Care  Updates made by Danie Chandler, RN since 02/11/2023 12:00 AM     Problem: Health Promotion or Disease Self-Management (General Plan of Care)      Long-Range Goal: Chronic Disease Management and Care Coordination Needs   Start Date: 02/11/2023  Expected End Date: 05/14/2023  Priority: High  Note:   Current Barriers:  Knowledge Deficits related to plan of care for management of Asthma, eczema, obesity, anxiety, PTSD, depression, allergies Care Coordination needs related to Psychologist/Psychiatrist referral Chronic Disease Management support and education needs related to Asthma, eczema,  obesity, anxiety, PTSD, depression, allergies 02/11/23:  Patient seen by [facility] today-Dr. Hall-for eczema on both hands-new cream prescribed.  Patient never received referral to Psychology/Psychiatry by PCP in May-LCSW referral placed  for assistance with finding a provider.  Swelling in legs, face, fingers-discussed low sodium diet  RNCM Clinical Goal(s):  Patient will verbalize understanding of plan for management of Asthma, eczema, obesity, anxiety, PTSD, depression, allergies as evidenced by patient  report verbalize basic understanding of Asthma, eczema, obesity, anxiety, PTSD, depression, allergies disease process and self health management plan as evidenced by patient report take all medications exactly as prescribed and will call provider for medication related questions as evidenced by patient report demonstrate understanding of rationale for each prescribed medication as evidenced by patient report attend all scheduled medical appointments  as evidenced by patient report and EMR review. Demonstrate ongoing  adherence to prescribed treatment plan for Asthma, eczema, obesity, anxiety, PTSD, depression, allergies  as evidenced by patient report and EMR review. continue to work with RN Care Manager to address care management and care coordination needs related to Asthma, eczema, obesity, anxiety, PTSD, depression, allergies as evidenced by adherence to CM Team Scheduled appointments work with Child psychotherapist to address  related to the management of anxiety, depression, PTSD, Bipolar related to the management of Asthma, eczema, obesity, anxiety, PTSD, depression, allergies as evidenced by review of EMR and patient or Child psychotherapist report through collaboration with Medical illustrator, provider, and care team.   Interventions: Inter-disciplinary care team collaboration (see longitudinal plan of care) Evaluation of current treatment plan related to  self management and patient's adherence to plan as established by provider Collaborated with LCSW LCSW referral for anxiety, depression, PTSD, BP  Asthma: (Status:New goal.) Long Term Goal Discussed the importance of adequate rest and management of fatigue with Asthma Assessed social determinant of health barriers   Patient Goals/Self-Care Activities: Take all medications as prescribed Attend all scheduled provider appointments Call pharmacy for medication refills 3-7 days in advance of running out of medications Perform all self care activities  independently  Perform IADL's (shopping, preparing meals, housekeeping, managing finances) independently Call provider office for new concerns or questions  Work with the social worker to address care coordination needs and will continue to work with the clinical team to address health care and disease management related needs 02/11/23:  Patient to contact [facility] for dental providers in her area-declined BSW referral  Follow Up Plan:  The patient has been provided with contact information for the care management team and has been advised to call with any health related questions or concerns.  The care management team will reach out to the patient again over the next 30 business days.

## 2023-02-12 ENCOUNTER — Ambulatory Visit: Payer: 59 | Admitting: Licensed Clinical Social Worker

## 2023-02-12 ENCOUNTER — Telehealth: Payer: Self-pay | Admitting: Licensed Clinical Social Worker

## 2023-02-12 NOTE — Patient Outreach (Signed)
Care Coordination  02/12/2023  Deborah Lambert 1985-07-11 [id number]  [facility] LCSW completed call to patient and successfully rescheduled her initial social work appointment for 02/19/23 at 1 pm and sent her an email with crisis support resources in her area for her to review until our appointment next week. Brief emotional support provided as well.  Dickie La, BSW, MSW, [employer] Managed Medicaid LCSW [facility]  [facility] [email or web address] Phone: [phone]

## 2023-02-19 ENCOUNTER — Other Ambulatory Visit: Payer: 59 | Admitting: Licensed Clinical Social Worker

## 2023-02-19 NOTE — Patient Instructions (Signed)
Visit Information  Deborah Lambert was given information about Medicaid Managed Care team care coordination services as a part of their [facility] Community Plan Medicaid benefit. Deborah Lambert verbally consented to engagement with the [facility] Managed Care team.   If you are experiencing a medical emergency, please call 911 or report to your local emergency department or urgent care.   If you have a non-emergency medical problem during routine business hours, please contact your provider's office and ask to speak with a nurse.   For questions related to your [facility], please call: [phone] or visit the homepage here: [email or web address]  If you would like to schedule transportation through your [facility], please call the following number at least 2 days in advance of your appointment: [phone]   Rides for urgent appointments can also be made after hours by calling Member Services.  Call the Behavioral Health Crisis Line at [phone], at any time, 24 hours a day, 7 days a week. If you are in danger or need immediate medical attention call 911.  If you would like help to quit smoking, call 1-800-QUIT-NOW ((601) 632-2869) OR Espaol: 1-855-Djelo-Ya (0-272-536-6440) o para ms informacin haga clic aqu or Text READY to 347-425 to register via text  Following is a copy of your plan of care:  Care Plan : LCSW Plan of Care  Updates made by Gustavus Bryant, LCSW since 02/19/2023 12:00 AM     Problem: Anxiety Identification (Anxiety)      Goal: Anxiety Symptoms Identified   Note:     Timeframe:  Short-Range Goal Priority:  High Start Date:  02/19/23      Expected End Date:  ongoing                     Follow Up Date--03/11/23 at 1 pm  - keep 90 percent of scheduled appointments -consider counseling or psychiatry -consider bumping up your self-care  -consider  creating a stronger support network   Why is this important?             Combatting depression may take some time.            If you don't feel better right away, don't give up on your treatment plan.    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 Patient requires Support, Education, Resources, Referrals, Advocacy, and Care Coordination, in order to meet Unmet Mental Health Needs. (To Find a Visual merchandiser) Past history of trauma Patient will implement clinical interventions discussed today to decrease symptoms of depression and increase knowledge and/or ability of: coping skills. Mental Health Concerns and Social Isolation Patient lacks knowledge of available community counseling agencies and resources.  Clinical Goal(s): verbalize understanding of plan for management of Anxiety, Depression, and PTSD symptoms and demonstrate a reduction in these symptoms. Patient will connect with a provider for ongoing mental health treatment, increase coping skills, healthy habits, self-management skills, and stress reduction        Patient Goals/Self-Care Activities: Take medications as prescribed   Attend all scheduled provider appointments Call pharmacy for medication refills 3-7 days in advance of running out of medications Perform all self care activities independently  Perform IADL's (shopping, preparing meals, housekeeping, managing finances) independently Call provider office for new concerns or questions Work with the social worker to address care coordination needs and will continue to work  with the clinical team to address health care and disease management related needs call 1-800-273-TALK (toll free, 24 hour hotline) go to [facility] [address], [place] [phone]) call 911 if experiencing a Mental Health or Behavioral Health Crisis   Utilize healthy coping skills and supportive resources discussed Contact PCP with any questions or concerns Keep 90 percent of counseling appointments Call your insurance provider for more information about your Enhanced Benefits  Check out counseling resources provided  Begin personal counseling with LCSW, to reduce and manage symptoms of Depression and Stress, until well-established with mental health provider Accept all calls from representative with [facility] in an effort to establish ongoing mental health counseling and supportive services. Incorporate into daily practice - relaxation techniques, deep breathing exercises, and mindfulness meditation strategies. Talk about feelings with friends, family members, spiritual advisor, etc. Contact LCSW directly [phone]), if you have questions, need assistance, or if additional social work needs are identified between now and our next scheduled telephone outreach call. Call 988 for mental health hotline/crisis line if needed (24/7 available) Try techniques to reduce symptoms of anxiety/negative thinking (deep breathing, distraction, positive self talk, etc)  - develop a personal safety plan - develop a plan to deal with triggers like holidays, anniversaries - exercise at least 2 to 3 times per week - have a plan for how to handle bad days - journal feelings and what helps to feel better or worse - spend time or talk with others at least 2 to 3 times per week - watch for early signs of feeling worse - begin personal counseling - call and visit an old friend - check out volunteer opportunities - join a support group - laugh; watch a funny movie or comedian - learn and use visualization or guided imagery - perform a random act of kindness - practice relaxation or meditation daily - start or continue a personal journal - practice positive thinking and self-talk -continue with compliance of taking medication  -identify current effective  and ineffective coping strategies.  -implement positive self-talk in care to increase self-esteem, confidence and feelings of control.  -consider alternative and complementary therapy approaches such as meditation, mindfulness or yoga.  Call your insurance provider to gain education on benefits if desired Call your primary care doctor if symptoms get worse  -journaling, prayer, worship services, meditation or pastoral counseling.  -increase participation in pleasurable group activities such as hobbies, singing, sports or volunteering).  -consider the use of meditative movement therapy such as tai chi, yoga or qigong.  -start a regular daily exercise program based on tolerance, ability and patient choice to support positive thinking and activity    Follow Up Plan:  The patient has been provided with contact information for the care management team and has been advised to call with any mental health or health related questions or concerns.  The care management team will reach out to the patient again over the next 30 business  days.   If you are experiencing a Mental Health or Behavioral Health Crisis or need someone to talk to, please call the Suicide and Crisis Lifeline: 988    Patient Goals: Initial goal     24- Hour Availability:    [facility]  [address] [place], [place] [phone] Crisis [phone]   [facility] [phone]  [place] [facility]  [phone]    [facility]  [phone] (after hours)  Therapeutic Alternative/Mobile Crisis   1-828-430-4584   [place] National Suicide Hotline  310-468-9777 Len Childs) [place] [address]   Call 316-718-3207 for mental health emergencies   [facility]  [phone]);  [place] and [employer]  [phone]); [place], [place], [place], [place], [place], [place], [place]    [place] [facility] for [facility] Residents For 24/7 walk-up access to mental health services for [facility] children (4+), adolescents and adults, please visit the [facility] located at [address] in [place], [place].  *[facility] also provides comprehensive outpatient behavioral health services in a variety of locations around the [place].  Connect With [place] [address] [place], [place] [address] HelpLine: 972-095-3747 or 1-(331) 103-2991  Get Directions  Find Help 24/7 By Phone Call our 24-hour HelpLine at 8145772978 or 7020781330 for immediate assistance for mental health and substance abuse issues.  Walk-In Help [place]: [facility] (Ages 4 and Up) [place]: Emergency Dept., [facility] Additional Resources National Hopeline Network: 1-800-SUICIDE The National Suicide Prevention Lifeline: 1-800-273-TALK     Dickie La, BSW, MSW, LCSW Managed Medicaid LCSW [facility]  [facility] [email or web address] Phone: [phone]
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
## 2023-03-08 DIAGNOSIS — R490 Dysphonia: Secondary | ICD-10-CM | POA: Diagnosis not present

## 2023-03-08 DIAGNOSIS — K219 Gastro-esophageal reflux disease without esophagitis: Secondary | ICD-10-CM | POA: Diagnosis not present

## 2023-03-08 DIAGNOSIS — J382 Nodules of vocal cords: Secondary | ICD-10-CM | POA: Diagnosis not present

## 2023-03-11 ENCOUNTER — Other Ambulatory Visit: Payer: 59 | Admitting: Licensed Clinical Social Worker

## 2023-03-11 NOTE — Patient Instructions (Signed)
[name] ,   The [facility] Managed Care Team is available to provide assistance to you with your healthcare needs at no cost and as a benefit of your [facility] Health plan. I'm sorry I was unable to reach you today for our scheduled appointment. Our care guide will call you to reschedule our telephone appointment. Please call me at the number below. I am available to be of assistance to you regarding your healthcare needs. .   Thank you,   Dickie La, BSW, MSW, LCSW Managed Medicaid LCSW [facility]  [email or web address] Phone: [phone]

## 2023-03-11 NOTE — Patient Outreach (Signed)
  Medicaid Managed Care   Unsuccessful Attempt Note   03/11/2023 Name: Deborah Lambert MRN: [id number] DOB: [birth year]  Referred by: Debera Lat, PA-C Reason for referral : No chief complaint on file.   An unsuccessful telephone outreach was attempted today. The patient was referred to the case management team for assistance with care management and care coordination.    Follow Up Plan: A HIPAA compliant phone message was left for the patient providing contact information and requesting a return call. and The Managed Medicaid care management team will reach out to the patient again over the next 30 days.   Dickie La, BSW, MSW, [employer] Managed Medicaid LCSW [facility]  [facility] [email or web address] Phone: [phone]

## 2023-03-14 ENCOUNTER — Encounter: Payer: Self-pay | Admitting: Obstetrics and Gynecology

## 2023-03-14 ENCOUNTER — Other Ambulatory Visit: Payer: 59 | Admitting: Obstetrics and Gynecology

## 2023-03-14 NOTE — Patient Outreach (Signed)
Medicaid Managed Care   Nurse Care Manager Note  03/14/2023 Name:  Deborah Lambert MRN:  [id number] DOB:  Jun 21, 1986  Deborah Lambert is an 37 y.o. year old female who is a primary patient of Debera Lat, [place].  The [facility] Managed Care Coordination team was consulted for assistance with:    Chronic healthcare management needs, asthma, eczema, obesity, anxiety/PTSD, depression/BP, allergies  Ms. Tarleton was given information about Medicaid Managed Care Coordination team services today. Deborah Lambert Patient agreed to services and verbal consent obtained.  Engaged with patient by telephone for follow up visit in response to provider referral for case management and/or care coordination services.   Assessments/Interventions:  Review of past medical history, allergies, medications, health status, including review of consultants reports, laboratory and other test data, was performed as part of comprehensive evaluation and provision of chronic care management services.  SDOH (Social Determinants of Health) assessments and interventions performed: SDOH Interventions    Flowsheet Row Patient Outreach Telephone from 03/14/2023 in [place] POPULATION HEALTH DEPARTMENT Patient Outreach Telephone from 02/19/2023 in [place] POPULATION HEALTH DEPARTMENT Patient Outreach Telephone from 02/11/2023 in [place] POPULATION HEALTH DEPARTMENT Office Visit from 10/31/2022 in [facility]  SDOH Interventions      Food Insecurity Interventions Intervention Not Indicated -- -- --  Housing Interventions Intervention Not Indicated -- -- --  Utilities Interventions -- -- Intervention Not Indicated --  Depression Interventions/Treatment  -- -- -- Medication, Referral to Psychiatry  Stress Interventions -- [employer], Provide Counseling -- --  Health Literacy Interventions -- -- Intervention Not Indicated --     Care Plan Allergies  Allergen  Reactions   Aspirin Shortness Of Breath and Rash   Codeine Shortness Of Breath and Rash   Aspirin Hives   Codeine Hives   Almond Oil Rash   Coconut (Cocos Nucifera) Rash    Medications Reviewed Today     Reviewed by Danie Chandler, RN (Registered Nurse) on 03/14/23 at 1115  Med List Status: <None>   Medication Order Taking? Sig Documenting Provider Last Dose Status Informant  albuterol (VENTOLIN HFA) 108 (90 Base) MCG/ACT inhaler [id number] Yes Inhale 2 puffs into the lungs every 6 (six) hours as needed for wheezing or shortness of breath. Inhale into the lungs every 6 (six) hours as needed for wheezing or shortness of breath. Debera Lat, PA-C  Active   busPIRone (BUSPAR) 5 MG tablet [id number]  Take 1 tablet (5 mg total) by mouth 2 (two) times daily. Debera Lat, PA-C  Active   fluocinonide cream (LIDEX) 0.05 % [id number]  Apply 1 Application topically 2 (two) times daily.  Patient not taking: Reported on 02/11/2023   Debera Lat, PA-C  Active   hydrOXYzine (VISTARIL) 25 MG capsule [id number]  1 tab at bedtime for itchiness and anxiety  Patient not taking: Reported on 02/11/2023   Debera Lat, PA-C  Active   meloxicam (MOBIC) 15 MG tablet [id number]  Take 1 tablet (15 mg total) by mouth daily. Cuthriell, Delorise Royals, PA-C  Active   Multiple Vitamin (MULTIVITAMIN) tablet [id number]  Take 1 tablet by mouth daily. [provider]  Active Self  omeprazole (PRILOSEC) 20 MG capsule [id number] Yes Take 1 capsule (20 mg total) by mouth daily.   Active   triamcinolone ointment (KENALOG) 0.5 % [id number]  Apply 1 Application topically 2 (two) times daily.  Patient not taking: Reported on 02/11/2023   Tommie Sams, DO  Active  Patient Active Problem List   Diagnosis Date Noted   Generalized edema 11/12/2022   Dizziness 11/12/2022   Encounter for screening for HIV 11/12/2022   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/12/2022   Encounter for hepatitis C screening  test for low risk patient 11/12/2022   Obesity (BMI 30-39.9) 10/31/2022   Anxiety 10/31/2022   Uncomplicated asthma 10/31/2022   Eczema of both hands 10/31/2022   PTSD (post-traumatic stress disorder) 10/31/2022   Encounter for medical examination to establish care 10/31/2022   Seasonal allergies 10/31/2022   Depression, recurrent (HCC) 10/31/2022   Conditions to be addressed/monitored per PCP order:  Chronic healthcare management needs, asthma, eczema, obesity, anxiety/PTSD, depression/BP, allergies  Care Plan : RN Care Manager Plan of Care  Updates made by Danie Chandler, RN since 03/14/2023 12:00 AM     Problem: Health Promotion or Disease Self-Management (General Plan of Care)      Long-Range Goal: Chronic Disease Management and Care Coordination Needs   Start Date: 02/11/2023  Expected End Date: 05/14/2023  Priority: High  Note:   Current Barriers:  Knowledge Deficits related to plan of care for management of Asthma, eczema, obesity, anxiety, PTSD, depression, allergies Care Coordination needs related to Psychologist/Psychiatrist referral Chronic Disease Management support and education needs related to Asthma, eczema, obesity, anxiety, PTSD, depression, allergies 03/14/23:  patient states she is on waiting list for therapy services-LCSW f/u scheduled.  No change in swelling.  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  No breathing issues.  To f/u on dental provider with [facility] plan-declined BSW.  RNCM Clinical Goal(s):  Patient will verbalize understanding of plan for management of Asthma, eczema, obesity, anxiety, PTSD, depression, allergies as evidenced by patient report verbalize basic understanding of Asthma, eczema, obesity, anxiety, PTSD, depression, allergies disease process and self health management plan as evidenced by patient report take all medications exactly as  prescribed and will call provider for medication related questions as evidenced by patient report demonstrate understanding of rationale for each prescribed medication as evidenced by patient report attend all scheduled medical appointments  as evidenced by patient report and EMR review. Demonstrate ongoing  adherence to prescribed treatment plan for Asthma, eczema, obesity, anxiety, PTSD, depression, allergies  as evidenced by patient report and EMR review. continue to work with RN Care Manager to address care management and care coordination needs related to Asthma, eczema, obesity, anxiety, PTSD, depression, allergies as evidenced by adherence to CM Team Scheduled appointments work with Child psychotherapist to address  related to the management of anxiety, depression, PTSD, Bipolar related to the management of Asthma, eczema, obesity, anxiety, PTSD, depression, allergies as evidenced by review of EMR and patient or Child psychotherapist report through collaboration with Medical illustrator, provider, and care team.   Interventions: Inter-disciplinary care team collaboration (see longitudinal plan of care) Evaluation of current treatment plan related to  self management and patient's adherence to plan as established by provider Collaborated with LCSW LCSW referral for anxiety, depression, PTSD, BP  Asthma: (Status:New goal.) Long Term Goal Discussed the importance of adequate rest and management of fatigue with Asthma Assessed social determinant of health barriers   Patient Goals/Self-Care Activities: Take all medications as prescribed Attend all scheduled provider appointments Call pharmacy for medication refills 3-7 days in advance of running out of medications Perform all self care activities independently  Perform IADL's (shopping, preparing meals, housekeeping, managing finances) independently  Call provider office for new concerns or questions  Work with the social worker to address care coordination  needs and will continue to work with the clinical team to address health care and disease management related needs 02/11/23:  Patient to contact [facility] for dental providers in her area-declined BSW referral 03/14/23:  Patient to f/u with [facility]  Follow Up Plan:  The patient has been provided with contact information for the care management team and has been advised to call with any health related questions or concerns.  The care management team will reach out to the patient again over the next 30 business  days.   Long-Range Goal: Establish Plan of Care for Chronic Disease Management and Care Coordination Needs   Priority: High  Note:   Timeframe:  Long-Range Goal Priority:  High Start Date:  02/11/23                           Expected End Date:                       Follow Up Date 04/15/23   - prevent colds and flu by washing hands, covering coughs and sneezes, getting enough rest - schedule and keep appointment for annual check-up    Why is this important?   Screening tests can find problems  The doctor or nurse will talk with you about which tests are important.  03/14/23:  patient being followed by [facility], on waiting list for therapy services   Follow Up:  Patient agrees to Care Plan and Follow-up.  Plan: The Managed Medicaid care management team will reach out to the patient again over the next 30 business  days. and The  Patient has been provided with contact information for the Managed Medicaid care management team and has been advised to call with any health related questions or concerns.  Date/time of next scheduled RN care management/care coordination outreach:  04/15/23 at 1230.

## 2023-03-14 NOTE — Patient Instructions (Signed)
Hi Deborah Lambert, thank you for updating me-have a nice day!  Deborah Lambert was given information about Medicaid Managed Care team care coordination services as a part of their [facility] Community Plan Medicaid benefit. Deborah Lambert verbally consented to engagement with the [facility] Managed Care team.   If you are experiencing a medical emergency, please call 911 or report to your local emergency department or urgent care.   If you have a non-emergency medical problem during routine business hours, please contact your provider's office and ask to speak with a nurse.   For questions related to your [facility], please call: [phone] or visit the homepage here: [email or web address]  If you would like to schedule transportation through your [facility], please call the following number at least 2 days in advance of your appointment: [phone]   Rides for urgent appointments can also be made after hours by calling Member Services.  Call the Behavioral Health Crisis Line at [phone], at any time, 24 hours a day, 7 days a week. If you are in danger or need immediate medical attention call 911.  If you would like help to quit smoking, call 1-800-QUIT-NOW (279-509-9668) OR Espaol: 1-855-Djelo-Ya (1-324-401-0272) o para ms informacin haga clic aqu or Text READY to 536-644 to register via text  Deborah Lambert - following are the goals we discussed in your visit today:   Goals Addressed    Timeframe:  Long-Range Goal Priority:  High Start Date:  02/11/23                           Expected End Date:                       Follow Up Date 04/15/23   - prevent colds and flu by washing hands, covering coughs and sneezes, getting enough rest - schedule and keep appointment for annual check-up    Why is this important?   Screening tests can find problems  The doctor or nurse  will talk with you about which tests are important.  03/14/23:  patient being followed by [facility], on waiting list for therapy services  Patient verbalizes understanding of instructions and care plan provided today and agrees to view in MyChart. Active MyChart status and patient understanding of how to access instructions and care plan via MyChart confirmed with patient.     The Managed Medicaid care management team will reach out to the patient again over the next 30 business  days.  The  Patient  has been provided with contact information for the Managed Medicaid care management team and has been advised to call with any health related questions or concerns.   Deborah Der RN, BSN [place]  [facility] Care Management Coordinator - Managed Medicaid High Risk [phone]   Following is a copy of your plan of care:  Care Plan : RN Care Manager Plan of Care  Updates made by Danie Chandler, RN since 03/14/2023 12:00 AM     Problem: Health Promotion or Disease Self-Management (General Plan of Care)      Long-Range Goal: Chronic Disease Management and Care Coordination Needs   Start Date: 02/11/2023  Expected End Date: 05/14/2023  Priority: High  Note:   Current Barriers:  Knowledge Deficits related to plan of care for management of Asthma, eczema, obesity, anxiety, PTSD, depression, allergies Care Coordination needs related to Psychologist/Psychiatrist  referral Chronic Disease Management support and education needs related to Asthma, eczema, obesity, anxiety, PTSD, depression, allergies 03/14/23:  patient states she is on waiting list for therapy services-LCSW f/u scheduled.  No change in swelling.  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  No breathing issues.  To f/u on dental provider with [facility] plan-declined BSW.  RNCM Clinical Goal(s):  Patient will verbalize  understanding of plan for management of Asthma, eczema, obesity, anxiety, PTSD, depression, allergies as evidenced by patient report verbalize basic understanding of Asthma, eczema, obesity, anxiety, PTSD, depression, allergies disease process and self health management plan as evidenced by patient report take all medications exactly as prescribed and will call provider for medication related questions as evidenced by patient report demonstrate understanding of rationale for each prescribed medication as evidenced by patient report attend all scheduled medical appointments  as evidenced by patient report and EMR review. Demonstrate ongoing  adherence to prescribed treatment plan for Asthma, eczema, obesity, anxiety, PTSD, depression, allergies  as evidenced by patient report and EMR review. continue to work with RN Care Manager to address care management and care coordination needs related to Asthma, eczema, obesity, anxiety, PTSD, depression, allergies as evidenced by adherence to CM Team Scheduled appointments work with Child psychotherapist to address  related to the management of anxiety, depression, PTSD, Bipolar related to the management of Asthma, eczema, obesity, anxiety, PTSD, depression, allergies as evidenced by review of EMR and patient or Child psychotherapist report through collaboration with Medical illustrator, provider, and care team.   Interventions: Inter-disciplinary care team collaboration (see longitudinal plan of care) Evaluation of current treatment plan related to  self management and patient's adherence to plan as established by provider Collaborated with LCSW LCSW referral for anxiety, depression, PTSD, BP  Asthma: (Status:New goal.) Long Term Goal Discussed the importance of adequate rest and management of fatigue with Asthma Assessed social determinant of health barriers   Patient Goals/Self-Care Activities: Take all medications as prescribed Attend all scheduled provider  appointments Call pharmacy for medication refills 3-7 days in advance of running out of medications Perform all self care activities independently  Perform IADL's (shopping, preparing meals, housekeeping, managing finances) independently Call provider office for new concerns or questions  Work with the social worker to address care coordination needs and will continue to work with the clinical team to address health care and disease management related needs 02/11/23:  Patient to contact [facility] for dental providers in her area-declined BSW referral 03/14/23:  Patient to f/u with [facility]  Follow Up Plan:  The patient has been provided with contact information for the care management team and has been advised to call with any health related questions or concerns.  The care management team will reach out to the patient again over the next 30 business  days.

## 2023-03-21 ENCOUNTER — Other Ambulatory Visit: Payer: 59 | Admitting: Licensed Clinical Social Worker
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
## 2023-03-21 NOTE — Patient Instructions (Signed)
Visit Information  Deborah Lambert was given information about Medicaid Managed Care team care coordination services as a part of their [facility] Community [facility] Medicaid benefit. Deborah Lambert verbally consented to engagement with the [facility] Managed Care team.   If you are experiencing a medical emergency, please call 911 or report to your local emergency department or urgent care.   If you have a non-emergency medical problem during routine business hours, please contact your provider's office and ask to speak with a nurse.   For questions related to your [facility], please call: [phone] or visit the homepage here: [email or web address]  If you would like to schedule transportation through your [facility], please call the following number at least 2 days in advance of your appointment: [phone]   Rides for urgent appointments can also be made after hours by calling Member Services.  Call the Behavioral Health Crisis Line at [phone]) [phone], at any time, 24 hours a day, 7 days a week. If you are in danger or need immediate medical attention call 911.  If you would like help to quit smoking, call 1-800-QUIT-NOW (7262354540) OR Espaol: 1-855-Djelo-Ya (1-324-401-0272) o para ms informacin haga clic aqu or Text READY to 536-644 to register via text  Following is a copy of your plan of care:  Care Plan : LCSW Plan of Care  Updates made by Gustavus Bryant, LCSW since 03/21/2023 12:00 AM     Problem: Anxiety Identification (Anxiety)      Goal: Anxiety Symptoms Identified   Note:     Timeframe:  Short-Range Goal Priority:  High Start Date:  02/19/23      Expected End Date:  ongoing                     Follow Up Date--04/05/23 at 3 pm  - keep 90 percent of scheduled appointments -consider counseling or psychiatry -consider bumping up your self-care  -consider  creating a stronger support network   Why is this important?             Combatting depression may take some time.            If you don't feel better right away, don't give up on your treatment plan.    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 Patient requires Support, Education, Resources, Referrals, Advocacy, and Care Coordination, in order to meet Unmet Mental Health Needs. (To Find a Visual merchandiser) Past history of trauma Patient will implement clinical interventions discussed today to decrease symptoms of depression and increase knowledge and/or ability of: coping skills. Mental Health Concerns and Social Isolation Patient lacks knowledge of available community counseling agencies and resources.  Clinical Goal(s): verbalize understanding of plan for management of Anxiety, Depression, and PTSD symptoms and demonstrate a reduction in these symptoms. Patient will connect with a provider for ongoing mental health treatment, increase coping skills, healthy habits, self-management skills, and stress reduction        Patient Goals/Self-Care Activities: Take medications as prescribed   Attend all scheduled provider appointments Call pharmacy for medication refills 3-7 days in advance of running out of medications Perform all self care activities independently  Perform IADL's (shopping, preparing meals, housekeeping, managing finances) independently Call provider office for new concerns or questions Work with the social worker to address care coordination needs and will continue to work  with the clinical team to address health care and disease management related needs call 1-800-273-TALK (toll free, 24 hour hotline) go to [facility] [address] [place] [phone]) call 911 if experiencing a Mental Health or Behavioral Health Crisis   Utilize healthy coping skills and supportive resources discussed Contact PCP with any questions or concerns Keep 90 percent of counseling appointments Call your insurance provider for more information about your Enhanced Benefits  Check out counseling resources provided  Begin personal counseling with LCSW, to reduce and manage symptoms of Depression and Stress, until well-established with mental health provider Accept all calls from representative with [facility] in an effort to establish ongoing mental health counseling and supportive services. Incorporate into daily practice - relaxation techniques, deep breathing exercises, and mindfulness meditation strategies. Talk about feelings with friends, family members, spiritual advisor, etc. Contact LCSW directly [phone]), if you have questions, need assistance, or if additional social work needs are identified between now and our next scheduled telephone outreach call. Call 988 for mental health hotline/crisis line if needed (24/7 available) Try techniques to reduce symptoms of anxiety/negative thinking (deep breathing, distraction, positive self talk, etc)  - develop a personal safety plan - develop a plan to deal with triggers like holidays, anniversaries - exercise at least 2 to 3 times per week - have a plan for how to handle bad days - journal feelings and what helps to feel better or worse - spend time or talk with others at least 2 to 3 times per week - watch for early signs of feeling worse - begin personal counseling - call and visit an old friend - check out volunteer opportunities - join a support group - laugh; watch a funny movie or comedian - learn and use visualization or guided imagery - perform a random act of kindness - practice relaxation or meditation daily - start or continue a personal journal - practice positive thinking and self-talk -continue with compliance of taking medication  -identify current effective  and ineffective coping strategies.  -implement positive self-talk in care to increase self-esteem, confidence and feelings of control.  -consider alternative and complementary therapy approaches such as meditation, mindfulness or yoga.  Call your insurance provider to gain education on benefits if desired Call your primary care doctor if symptoms get worse  -journaling, prayer, worship services, meditation or pastoral counseling.  -increase participation in pleasurable group activities such as hobbies, singing, sports or volunteering).  -consider the use of meditative movement therapy such as tai chi, yoga or qigong.  -start a regular daily exercise program based on tolerance, ability and patient choice to support positive thinking and activity    Follow Up Plan:  The patient has been provided with contact information for the care management team and has been advised to call with any mental health or health related questions or concerns.  The care management team will reach out to the patient again over the next 30 business  days.   If you are experiencing a Mental Health or Behavioral Health Crisis or need someone to talk to, please call the Suicide and Crisis Lifeline: 988    Patient Goals: Follow up goal  The following coping skill education was provided for stress relief and mental health management: "When your car dies or a deadline looms, how do you respond? Long-term, low-grade or acute stress takes a serious toll on your body and mind, so don't ignore feelings of constant tension. Stress is a natural part  of life. However, too much stress can harm our health, especially if it continues every day. This is chronic stress and can put you at risk for heart problems like heart disease and depression. Understand what's happening inside your body and learn simple coping skills to combat the negative impacts of everyday stressors.  Types of Stress There are two types of stress: Emotional -  types of emotional stress are relationship problems, pressure at work, financial worries, experiencing discrimination or having a major life change. Physical - Examples of physical stress include being sick having pain, not sleeping well, recovery from an injury or having an alcohol and drug use disorder. Fight or Flight Sudden or ongoing stress activates your nervous system and floods your bloodstream with adrenaline and cortisol, two hormones that raise blood pressure, increase heart rate and spike blood sugar. These changes pitch your body into a fight or flight response. That enabled our ancestors to outrun saber-toothed tigers, and it's helpful today for situations like dodging a car accident. But most modern chronic stressors, such as finances or a challenging relationship, keep your body in that heightened state, which hurts your health. Effects of Too Much Stress If constantly under stress, most of [place] will eventually start to function less well.  Multiple studies link chronic stress to a higher risk of heart disease, stroke, depression, weight gain, memory loss and even premature death, so it's important to recognize the warning signals. Talk to your doctor about ways to manage stress if you're experiencing any of these symptoms: Prolonged periods of poor sleep. Regular, severe headaches. Unexplained weight loss or gain. Feelings of isolation, withdrawal or worthlessness. Constant anger and irritability. Loss of interest in activities. Constant worrying or obsessive thinking. Excessive alcohol or drug use. Inability to concentrate.  10 Ways to Cope with Chronic Stress It's key to recognize stressful situations as they occur because it allows you to focus on managing how you react. We all need to know when to close our eyes and take a deep breath when we feel tension rising. Use these tips to prevent or reduce chronic stress. 1. Rebalance Work and Home All work and no play? If you're  spending too much time at the office, intentionally put more dates in your calendar to enjoy time for fun, either alone or with others. 2. Get Regular Exercise Moving your body on a regular basis balances the nervous system and increases blood circulation, helping to flush out stress hormones. Even a daily 20-minute walk makes a difference. Any kind of exercise can lower stress and improve your mood ? just pick activities that you enjoy and make it a regular habit. 3. Eat Well and Limit Alcohol and Stimulants Alcohol, nicotine and caffeine may temporarily relieve stress but have negative health impacts and can make stress worse in the long run. Well-nourished bodies cope better, so start with a good breakfast, add more organic fruits and vegetables for a well-balanced diet, avoid processed foods and sugar, try herbal tea and drink more water. 4. Connect with Supportive People Talking face to face with another person releases hormones that reduce stress. Lean on those good listeners in your life. 5. Carve Out Hobby Time Do you enjoy gardening, reading, listening to music or some other creative pursuit? Engage in activities that bring you pleasure and joy; research shows that reduces stress by almost half and lowers your heart rate, too. 6. Practice Meditation, Stress Reduction or Yoga Relaxation techniques activate a state of restfulness that counterbalances your  body's fight-or-flight hormones. Even if this also means a 10-minute break in a long day: listen to music, read, go for a walk in nature, do a hobby, take a bath or spend time with a friend. Also consider doing a mindfulness exercise or try a daily deep breathing or imagery practice. Deep Breathing Slow, calm and deep breathing can help you relax. Try these steps to focus on your breathing and repeat as needed. Find a comfortable position and close your eyes. Exhale and drop your shoulders. Breathe in through your nose; fill your lungs and  then your belly. Think of relaxing your body, quieting your mind and becoming calm and peaceful. Breathe out slowly through your nose, relaxing your belly. Think of releasing tension, pain, worries or distress. Repeat steps three and four until you feel relaxed. Imagery This involves using your mind to excite the senses -- sound, vision, smell, taste and feeling. This may help ease your stress. Begin by getting comfortable and then do some slow breathing. Imagine a place you love being at. It could be somewhere from your childhood, somewhere you vacationed or just a place in your imagination. Feel how it is to be in the place you're imagining. Pay attention to the sounds, air, colors, and who is there with you. This is a place where you feel cared for and loved. All is well. You are safe. Take in all the smells, sounds, tastes and feelings. As you do, feel your body being nourished and healed. Feel the calm that surrounds you. Breathe in all the good. Breathe out any discomfort or tension. 7. Sleep Enough If you get less than seven to eight hours of sleep, your body won't tolerate stress as well as it could. If stress keeps you up at night, address the cause, and add extra meditation into your day to make up for the lost z's. Try to get seven to nine hours of sleep each night. Make a regular bedtime schedule. Keep your room dark and cool. Try to avoid computers, TV, cell phones and tablets before bed. 8. Bond with Connections You Enjoy Go out for a coffee with a friend, chat with a neighbor, call a family member, visit with a clergy member, or even hang out with your pet. Clinical studies show that spending even a short time with a companion animal can cut anxiety levels almost in half. 9. Take a Vacation Getting away from it all can reset your stress tolerance by increasing your mental and emotional outlook, which makes you a happier, more productive person upon return. Leave your cellphone and  laptop at home! 10. See a Counselor, Coach or Therapist If negative thoughts overwhelm your ability to make positive changes, it's time to seek professional help. Make an appointment today--your health and life are worth it."     24- Hour Availability:    [facility]  [address] [place], [place] [phone] Crisis [phone]   [facility] [phone]  [place] [facility]  [phone]    [facility]  [phone] (after hours)   Therapeutic Alternative/Mobile Crisis   [phone]   [place] National Suicide Hotline  412-072-1441 [place]) [place] [address]   Call [phone] for mental health emergencies   [facility]  [phone]);  [place] and [employer]  [phone]); [place], [place], [place], [place], [place], [place], [place]    [facility] for [facility] For  24/7 walk-up access to mental health services for [facility] children (4+), adolescents and adults, please visit the [facility] located at [address] in [place], [place].  *[place] also provides comprehensive outpatient behavioral health services in a variety of locations around the [place].  Connect With [place] [address] [place], [place] [address] HelpLine: 531-872-1738 or 1-9120935067  Get Directions  Find Help 24/7 By Phone Call our 24-hour HelpLine at 203 316 1152 or (612)070-9298 for immediate assistance for mental health and substance abuse issues.  Walk-In Help [place]: [facility] (Ages 4 and Up) [place]: Emergency Dept., [facility] Additional Resources National Hopeline Network: 1-800-SUICIDE The National Suicide Prevention Lifeline: 1-800-273-TALK      Dickie La, BSW, MSW, LCSW Managed Medicaid LCSW [facility]  [facility]  Network [email or web address] Phone: [phone]

## 2023-04-02 ENCOUNTER — Other Ambulatory Visit: Payer: Self-pay | Admitting: Physician Assistant

## 2023-04-02 ENCOUNTER — Ambulatory Visit (INDEPENDENT_AMBULATORY_CARE_PROVIDER_SITE_OTHER): Payer: 59 | Admitting: Physician Assistant

## 2023-04-02 ENCOUNTER — Encounter: Payer: Self-pay | Admitting: Physician Assistant

## 2023-04-02 VITALS — BP 109/81 | HR 75 | Ht 65.5 in | Wt 200.0 lb

## 2023-04-02 DIAGNOSIS — N63 Unspecified lump in unspecified breast: Secondary | ICD-10-CM

## 2023-04-02 DIAGNOSIS — R928 Other abnormal and inconclusive findings on diagnostic imaging of breast: Secondary | ICD-10-CM

## 2023-04-02 NOTE — Progress Notes (Unsigned)
Established patient visit  Patient: Deborah Lambert   DOB: Aug 26, 1985   37 y.o. Female  MRN: [id number] Visit Date: 04/02/2023  Today's healthcare provider: Debera Lat, PA-C   Chief Complaint  Patient presents with   Breast Problem    Patient reports knot on left breast that was red and has now turned purple and is a hole with other bumps around it. Patient reports she also feels the other bumps on her right breast too. First noticed last week. Patient has concerns due to family history of breast cancer. She has never completed a mammogram and would like to speak with someone in regards to being checked.    Subjective     Discussed the use of AI scribe software for clinical note transcription with the patient, who gave verbal consent to proceed.  History of Present Illness   The patient, with a history of HSP, presents with two new breast masses. The first mass, initially a 'bump,' has decreased in size and is now 'purple with a hole in the middle.' The second mass is described as 'little pebbles in my skin' and is very painful. Both masses have been present for approximately two weeks. The patient has a strong family history of breast cancer, affecting her mother, grandmother, and sisters. They also carry a 'rare gene' that predisposes them to 'catch rare stuff that other people wouldn't catch.'           11/12/2022    4:08 PM 10/31/2022    9:47 AM  Depression screen PHQ 2/9  Decreased Interest 1 1  Down, Depressed, Hopeless 0 1  PHQ - 2 Score 1 2  Altered sleeping 1 3  Tired, decreased energy 1 3  Change in appetite 1 3  Feeling bad or failure about yourself  1 1  Trouble concentrating 0 1  Moving slowly or fidgety/restless 0 0  Suicidal thoughts 0 0  PHQ-9 Score 5 13  Difficult doing work/chores Not difficult at all Somewhat difficult      10/31/2022    9:50 AM  GAD 7 : Generalized Anxiety Score  Nervous, Anxious, on Edge 1  Control/stop worrying 3  Worry too much -  different things 3  Trouble relaxing 3  Restless 3  Easily annoyed or irritable 3  Afraid - awful might happen 0  Total GAD 7 Score 16  Anxiety Difficulty Somewhat difficult    Medications: Outpatient Medications Prior to Visit  Medication Sig   albuterol (VENTOLIN HFA) 108 (90 Base) MCG/ACT inhaler Inhale 2 puffs into the lungs every 6 (six) hours as needed for wheezing or shortness of breath. Inhale into the lungs every 6 (six) hours as needed for wheezing or shortness of breath.   fluocinonide cream (LIDEX) 0.05 % Apply 1 Application topically 2 (two) times daily.   meloxicam (MOBIC) 15 MG tablet Take 1 tablet (15 mg total) by mouth daily.   Multiple Vitamin (MULTIVITAMIN) tablet Take 1 tablet by mouth daily.   triamcinolone ointment (KENALOG) 0.5 % Apply 1 Application topically 2 (two) times daily.   busPIRone (BUSPAR) 5 MG tablet Take 1 tablet (5 mg total) by mouth 2 (two) times daily. (Patient not taking: Reported on 04/02/2023)   hydrOXYzine (VISTARIL) 25 MG capsule 1 tab at bedtime for itchiness and anxiety (Patient not taking: Reported on 02/11/2023)   omeprazole (PRILOSEC) 20 MG capsule Take 1 capsule (20 mg total) by mouth daily.   No facility-administered medications prior to visit.    Review of  Systems  All other systems reviewed and are negative.  Except see HPI   {Insert previous labs (optional):23779} {See past labs  Heme  Chem  Endocrine  Serology  Results Review (optional):1}   Objective    BP 109/81 (BP Location: Left Arm, Patient Position: Sitting, Cuff Size: Normal)   Pulse 75   Ht 5' 5.5" (1.664 m)   Wt 200 lb (90.7 kg)   LMP 03/14/2023 Comment: 3-4 days long  SpO2 100%   BMI 32.78 kg/m  {Insert last BP/Wt (optional):23777}{See vitals history (optional):1}   Physical Exam Constitutional:      General: She is not in acute distress.    Appearance: Normal appearance.  HENT:     Head: Normocephalic.  Pulmonary:     Effort: Pulmonary effort is  normal. No respiratory distress.  Chest:  Breasts:    Tanner Score is 5.     Breasts are symmetrical.     Right: No swelling, bleeding, inverted nipple, mass, nipple discharge, skin change or tenderness.     Left: Mass, skin change and tenderness present. No swelling, bleeding, inverted nipple or nipple discharge.    Lymphadenopathy:     Upper Body:     Left upper body: No supraclavicular, axillary or pectoral adenopathy.  Neurological:     Mental Status: She is alert and oriented to person, place, and time. Mental status is at baseline.      No results found for any visits on 04/02/23.  Assessment & Plan    1. Breast mass in female Breast Masses Two new painful masses in the left breast noticed over the past two weeks. One mass has a central hole and is bruising. Strong family history of breast cancer (mother, grandmother, sisters). 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 -Order urgent diagnostic mammogram and ultrasound of the left breast. -Consider genetic screening for breast cancer genes in the future. - MM 3D DIAGNOSTIC MAMMOGRAM BILATERAL BREAST - US LIMITED ULTRASOUND INCLUDING AXILLA LEFT BREAST     Weight Gain Despite efforts to lose weight, patient reports weight gain. -No specific plan discussed.      No follow-ups on file.     The patient was advised to call back or seek an in-person evaluation if the symptoms worsen or if the condition fails to improve as anticipated.  I discussed the assessment and treatment plan with the patient. The patient was provided an opportunity to ask questions and all were answered. The patient agreed with the plan and demonstrated an understanding of the instructions.  I, Debera Lat, PA-C have reviewed all documentation for this visit. The documentation on  04/02/23 for the exam, diagnosis, procedures, and orders are all accurate and complete.  Debera Lat, [facility], MMS [facility] [phone] (phone) [phone] (fax)  [facility]

## 2023-04-04 ENCOUNTER — Ambulatory Visit
Admission: RE | Admit: 2023-04-04 | Discharge: 2023-04-04 | Disposition: A | Discharge status: Home / Self Care | Payer: 59 | Source: Ambulatory Visit | Attending: Physician Assistant | Admitting: Physician Assistant

## 2023-04-04 ENCOUNTER — Other Ambulatory Visit: Payer: Self-pay | Admitting: Physician Assistant

## 2023-04-04 DIAGNOSIS — R928 Other abnormal and inconclusive findings on diagnostic imaging of breast: Secondary | ICD-10-CM

## 2023-04-04 DIAGNOSIS — N644 Mastodynia: Secondary | ICD-10-CM | POA: Diagnosis not present

## 2023-04-04 DIAGNOSIS — N63 Unspecified lump in unspecified breast: Secondary | ICD-10-CM | POA: Diagnosis not present

## 2023-04-04 DIAGNOSIS — N6320 Unspecified lump in the left breast, unspecified quadrant: Secondary | ICD-10-CM | POA: Diagnosis not present

## 2023-04-04 DIAGNOSIS — R92323 Mammographic fibroglandular density, bilateral breasts: Secondary | ICD-10-CM | POA: Diagnosis not present

## 2023-04-04 DIAGNOSIS — N6310 Unspecified lump in the right breast, unspecified quadrant: Secondary | ICD-10-CM | POA: Diagnosis not present

## 2023-04-04 NOTE — Progress Notes (Signed)
Spoke with pt and pt agreed for a referral to oncology

## 2023-04-05 ENCOUNTER — Other Ambulatory Visit: Payer: 59 | Admitting: Licensed Clinical Social Worker

## 2023-04-05 NOTE — Patient Instructions (Signed)
Visit Information  Deborah Lambert was given information about Medicaid Managed Care team care coordination services as a part of their [facility] Community Plan Medicaid benefit. Deborah Lambert verbally consented to engagement with the [facility] Managed Care team.   If you are experiencing a medical emergency, please call 911 or report to your local emergency department or urgent care.   If you have a non-emergency medical problem during routine business hours, please contact your provider's office and ask to speak with a nurse.   For questions related to your [facility], please call: [phone] or visit the homepage here: [email or web address]  If you would like to schedule transportation through your [facility], please call the following number at least 2 days in advance of your appointment: [phone]   Rides for urgent appointments can also be made after hours by calling Member Services.  Call the Behavioral Health Crisis Line at [phone], at any time, 24 hours a day, 7 days a week. If you are in danger or need immediate medical attention call 911.  If you would like help to quit smoking, call 1-800-QUIT-NOW ((858)672-2832) OR Espaol: 1-855-Djelo-Ya (1-324-401-0272) o para ms informacin haga clic aqu or Text READY to 536-644 to register via text  Following is a copy of your plan of care:  Care Plan : LCSW Plan of Care  Updates made by Gustavus Bryant, LCSW since 04/05/2023 12:00 AM     Problem: Anxiety Identification (Anxiety)      Goal: Anxiety Symptoms Identified   Note:     Timeframe:  Short-Range Goal Priority:  High Start Date:  02/19/23      Expected End Date:  04/05/23- Resources provided and goal has been met.                  Goal completed on 04/05/23- Patient was informed by [facility] that is unable to gain services there as her Medicaid states it is for  [facility] not [place]. Patient reports that she is preparing to relocate to [facility] soon and prefers to wait to start behavioral health treatment until she is moved in and settled. Patient reports that she does not wish to gain a therapy during this upcoming transition. Patient is aware that once she relocates, she will need to transfer her Medicaid from [facility] to [facility] through DSS before she can go as a walk in clinic to [facility] to enroll in their program. [facility] LCSW will sign off at this time and will update involved [facility] RNCM.   - keep 90 percent of scheduled appointments -consider counseling or psychiatry -consider bumping up your self-care  -consider creating a stronger support network   Why is this important?             Combatting depression may take some time.            If you don't feel better right away, don't give up on your treatment plan.   Patient Goals/Self-Care Activities: Take medications as prescribed   Attend all scheduled provider appointments Call pharmacy for medication refills 3-7 days in advance of running out of medications Perform all self care activities independently  Perform IADL's (shopping, preparing meals, housekeeping, managing finances) independently Call provider office for new concerns or questions Work with the social worker to address care coordination needs and will continue to work with the clinical team to address health care and disease management related needs call 1-800-273-TALK (  toll free, 24 hour hotline) go to [facility] [address] [place] [phone]) call 911 if experiencing a Mental Health or Behavioral Health Crisis  Utilize healthy coping skills and supportive resources discussed Contact PCP with any questions or concerns Keep 90 percent of counseling appointments Call your insurance provider for more information about your Enhanced Benefits  Check out  counseling resources provided  Incorporate into daily practice - relaxation techniques, deep breathing exercises, and mindfulness meditation strategies. Talk about feelings with friends, family members, spiritual advisor, etc. Contact LCSW directly [phone]), if you have questions, need assistance, or if additional social work needs are identified between now and our next scheduled telephone outreach call. Call 988 for mental health hotline/crisis line if needed (24/7 available) Try techniques to reduce symptoms of anxiety/negative thinking (deep breathing, distraction, positive self talk, etc)  - develop a personal safety plan - develop a plan to deal with triggers like holidays, anniversaries - exercise at least 2 to 3 times per week - have a plan for how to handle bad days - journal feelings and what helps to feel better or worse - spend time or talk with others at least 2 to 3 times per week - watch for early signs of feeling worse - begin personal counseling - call and visit an old friend - check out volunteer opportunities - join a support group - laugh; watch a funny movie or comedian - learn and use visualization or guided imagery - perform a random act of kindness - practice relaxation or meditation daily - start or continue a personal journal - practice positive thinking and self-talk -continue with compliance of taking medication  -identify current effective and ineffective coping strategies.  -implement positive self-talk in care to increase self-esteem, confidence and feelings of control.  -consider alternative and complementary therapy approaches such as meditation, mindfulness or yoga.  Call your insurance provider to gain education on benefits if desired Call your primary care doctor if symptoms get worse  -journaling, prayer, worship services, meditation or pastoral counseling.  -increase participation in pleasurable group activities such as hobbies, singing,  sports or volunteering).  -consider the use of meditative movement therapy such as tai chi, yoga or qigong.  -start a regular daily exercise program based on tolerance, ability and patient choice to support positive thinking and activity   Contact [facility] once relocated to start services  Follow Up Plan:  The patient has been provided with contact information for the care management team and has been advised to call with any mental health or health related questions or concerns.  The care management team will reach out to the patient again over the next 30 business  days.   If you are experiencing a Mental Health or Behavioral Health Crisis or need someone to talk to, please call the Suicide and Crisis Lifeline: 988   The following coping skill education was provided for stress relief and mental health management: "When your car dies or a deadline looms, how do you respond? Long-term, low-grade or acute stress takes a serious toll on your body and mind, so don't ignore feelings of constant tension. Stress is a natural part of life. However, too much stress can harm our health, especially if it continues every day. This is chronic stress and can put you at risk for heart problems like heart disease and depression. Understand what's happening inside your body and learn simple coping skills to combat the negative impacts of everyday stressors.  Types of Stress There are two types of stress: Emotional - types of emotional stress are relationship problems, pressure at work, financial worries, experiencing discrimination or having a major life change. Physical - Examples of physical stress include being sick having pain, not sleeping well, recovery from an injury or having an alcohol and drug use disorder. Fight or Flight Sudden or ongoing stress activates your nervous system and floods your bloodstream with adrenaline and cortisol, two hormones that raise blood pressure, increase heart rate and spike blood  sugar. These changes pitch your body into a fight or flight response. That enabled our ancestors to outrun saber-toothed tigers, and it's helpful today for situations like dodging a car accident. But most modern chronic stressors, such as finances or a challenging relationship, keep your body in that heightened state, which hurts your health. Effects of Too Much Stress If constantly under stress, most of [place] will eventually start to function less well.  Multiple studies link chronic stress to a higher risk of heart disease, stroke, depression, weight gain, memory loss and even premature death, so it's important to recognize the warning signals. Talk to your doctor about ways to manage stress if you're experiencing any of these symptoms: Prolonged periods of poor sleep. Regular, severe headaches. Unexplained weight loss or gain. Feelings of isolation, withdrawal or worthlessness. Constant anger and irritability. Loss of interest in activities. Constant worrying or obsessive thinking. Excessive alcohol or drug use. Inability to concentrate.  10 Ways to Cope with Chronic Stress It's key to recognize stressful situations as they occur because it allows you to focus on managing how you react. We all need to know when to close our eyes and take a deep breath when we feel tension rising. Use these tips to prevent or reduce chronic stress. 1. Rebalance Work and Home All work and no play? If you're spending too much time at the office, intentionally put more dates in your calendar to enjoy time for fun, either alone or with others. 2. Get Regular Exercise Moving your body on a regular basis balances the nervous system and increases blood circulation, helping to flush out stress hormones. Even a daily 20-minute walk makes a difference. Any kind of exercise can lower stress and improve your mood ? just pick activities that you enjoy and make it a regular habit. 3. Eat Well and Limit Alcohol and  Stimulants Alcohol, nicotine and caffeine may temporarily relieve stress but have negative health impacts and can make stress worse in the long run. Well-nourished bodies cope better, so start with a good breakfast, add more organic fruits and vegetables for a well-balanced diet, avoid processed foods and sugar, try herbal tea and drink more water. 4. Connect with Supportive People Talking face to face with another person releases hormones that reduce stress. Lean on those good listeners in your life. 5. Carve Out Hobby Time Do you enjoy gardening, reading, listening to music or some other creative pursuit? Engage in activities that bring you pleasure and joy; research shows that reduces stress by almost half and lowers your heart rate, too. 6. Practice Meditation, Stress Reduction or Yoga Relaxation techniques activate a state of restfulness that counterbalances your body's fight-or-flight hormones. Even if this also means a 10-minute break in a long day: listen to music, read, go for a walk in nature, do a hobby, take a bath or spend time with a friend. Also consider doing a mindfulness exercise or try a daily deep breathing or imagery practice. Deep Breathing  Slow, calm and deep breathing can help you relax. Try these steps to focus on your breathing and repeat as needed. Find a comfortable position and close your eyes. Exhale and drop your shoulders. Breathe in through your nose; fill your lungs and then your belly. Think of relaxing your body, quieting your mind and becoming calm and peaceful. Breathe out slowly through your nose, relaxing your belly. Think of releasing tension, pain, worries or distress. Repeat steps three and four until you feel relaxed. Imagery This involves using your mind to excite the senses -- sound, vision, smell, taste and feeling. This may help ease your stress. Begin by getting comfortable and then do some slow breathing. Imagine a place you love being at. It could  be somewhere from your childhood, somewhere you vacationed or just a place in your imagination. Feel how it is to be in the place you're imagining. Pay attention to the sounds, air, colors, and who is there with you. This is a place where you feel cared for and loved. All is well. You are safe. Take in all the smells, sounds, tastes and feelings. As you do, feel your body being nourished and healed. Feel the calm that surrounds you. Breathe in all the good. Breathe out any discomfort or tension. 7. Sleep Enough If you get less than seven to eight hours of sleep, your body won't tolerate stress as well as it could. If stress keeps you up at night, address the cause, and add extra meditation into your day to make up for the lost z's. Try to get seven to nine hours of sleep each night. Make a regular bedtime schedule. Keep your room dark and cool. Try to avoid computers, TV, cell phones and tablets before bed. 8. Bond with Connections You Enjoy Go out for a coffee with a friend, chat with a neighbor, call a family member, visit with a clergy member, or even hang out with your pet. Clinical studies show that spending even a short time with a companion animal can cut anxiety levels almost in half. 9. Take a Vacation Getting away from it all can reset your stress tolerance by increasing your mental and emotional outlook, which makes you a happier, more productive person upon return. Leave your cellphone and laptop at home! 10. See a Counselor, Coach or Therapist If negative thoughts overwhelm your ability to make positive changes, it's time to seek professional help. Make an appointment today--your health and life are worth it."     24- Hour Availability:    [facility]  [address] [place], [place] [phone] Crisis [phone]   [facility] [phone]  [place] [facility]  [phone]    [facility]   [phone] (after hours)   Therapeutic Alternative/Mobile Crisis   [phone]   [place] National Suicide Hotline  531 820 1440 Len Child[place]) [place] 010   Call [phone] for mental health emergencies   [facility]  [phone]);  [place] and [employer]  [phone]); [place], [place], [place], [place], [place], [place], [place]    [facility] for [facility] Residents For 24/7 walk-up access to mental health services for [facility] children (4+), adolescents and adults, please visit the [facility] located at [address] in [place], [place].  * also provides comprehensive outpatient behavioral health services in a variety of locations around the [place].  Connect With [place] [address] [place], [place] [address] HelpLine: (567)572-8792 or 1-(574)063-1479  Get Directions  Find Help 24/7 By Phone Call our 24-hour HelpLine at 385-583-8445 or (437) 762-2180 for immediate assistance for mental health and substance abuse issues.  Walk-In Help [place]: [facility] (Ages 4 and Up) [place]: Emergency Dept., [facility] Additional Resources National Hopeline Network: 1-800-SUICIDE The National Suicide Prevention Lifeline: 1-800-273-TALK     Dickie La, BSW, MSW, LCSW Managed Medicaid LCSW [facility]  [facility] [email or web address] Phone: [phone]
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
## 2023-04-08 ENCOUNTER — Encounter: Payer: Self-pay | Admitting: Licensed Clinical Social Worker

## 2023-04-08 ENCOUNTER — Inpatient Hospital Stay: Payer: 59 | Attending: Internal Medicine | Admitting: Licensed Clinical Social Worker

## 2023-04-08 ENCOUNTER — Inpatient Hospital Stay: Payer: 59

## 2023-04-08 DIAGNOSIS — Z9189 Other specified personal risk factors, not elsewhere classified: Secondary | ICD-10-CM | POA: Insufficient documentation

## 2023-04-08 DIAGNOSIS — Z801 Family history of malignant neoplasm of trachea, bronchus and lung: Secondary | ICD-10-CM | POA: Diagnosis not present

## 2023-04-08 DIAGNOSIS — N649 Disorder of breast, unspecified: Secondary | ICD-10-CM | POA: Insufficient documentation

## 2023-04-08 DIAGNOSIS — Z803 Family history of malignant neoplasm of breast: Secondary | ICD-10-CM | POA: Insufficient documentation

## 2023-04-08 DIAGNOSIS — Z808 Family history of malignant neoplasm of other organs or systems: Secondary | ICD-10-CM | POA: Diagnosis not present

## 2023-04-08 NOTE — Progress Notes (Signed)
REFERRING PROVIDER: Debera Lat, PA-C [address] [place],  [place] [address]  PRIMARY PROVIDER:  Debera Lat, PA-C  PRIMARY REASON FOR VISIT:  1. Family history of breast cancer      HISTORY OF PRESENT ILLNESS:   Deborah Lambert, a 37 y.o. female, was seen for a [facility] cancer genetics consultation at the request of Dr. Mercy Moore due to a family history of breast cancer.  Deborah Lambert presents to clinic today to discuss the possibility of a hereditary predisposition to cancer, genetic testing, and to further clarify her future cancer risks, as well as potential cancer risks for family members.   CANCER HISTORY:  Deborah Lambert is a 37 y.o. female with no personal history of cancer.    RISK FACTORS:  Menarche was at age 77.  First live birth at age 79.  Ovaries intact: yes.  Hysterectomy: no.  Menopausal status: premenopausal.  HRT use: 0 years. Colonoscopy: no; not examined. Mammogram within the last year: yes. - B density Number of breast biopsies: 0. Up to date with pelvic exams: no.  Past Medical History:  Diagnosis Date   Anxiety    Bell's palsy    Cluster headaches    Focal seizures (HCC)    PTSD (post-traumatic stress disorder)    Seizures (HCC)     Past Surgical History:  Procedure Laterality Date   right arm surgery      FAMILY HISTORY:  We obtained a detailed, 4-generation family history.  Significant diagnoses are listed below: Family History  Problem Relation Age of Onset   Stroke Mother    Breast cancer Mother        dx 58s, no genetic testing   Diabetes Father    Throat cancer Paternal Uncle    Breast cancer Maternal Grandmother        dx under 50   Melanoma Maternal Grandfather        metastatic, d. 23s   Breast cancer Paternal Grandmother        dx 51s   Lung cancer Paternal Grandfather    Deborah Lambert has 2 sons, 19 and 15. She has 2 brothers. One passed in an accident at 75.  Deborah Lambert mother had breast cancer in her 56s and  is living in her 59s, she has not had genetic testing. Maternal grandmother had breast cancer under age 31 and is living in her 8s. Maternal grandfather passed of metastatic melanoma in his 66s, he did have history of sun exposure.  Deborah Lambert father is living in his 65s. Paternal uncle currently has throat cancer in his 13s. Paternal grandmother had breast cancer in her 13s. Grandfather passed of lung cancer, had history of smoking.  Deborah Lambert is unaware of previous family history of genetic testing for hereditary cancer risks. There is no reported Ashkenazi Jewish ancestry. There is no known consanguinity.   GENETIC COUNSELING ASSESSMENT: Deborah Lambert is a 37 y.o. female with a family history of breast cancer which is somewhat suggestive of a hereditary cancer syndrome and predisposition to cancer. We, therefore, discussed and recommended the following at today's visit.   DISCUSSION: We discussed that approximately 10% of breast cancer is hereditary. Most cases of hereditary breast cancer are associated with BRCA1/BRCA2 genes, although there are other genes associated with hereditary cancer as well. Cancers and risks are gene specific. We discussed that testing is beneficial for several reasons including knowing about cancer risks, identifying potential screening and risk-reduction options that may be appropriate, and  to understand if other family members could be at risk for cancer and allow them to undergo genetic testing.   We reviewed the characteristics, features and inheritance patterns of hereditary cancer syndromes. We also discussed genetic testing, including the appropriate family members to test, the process of testing, insurance coverage and turn-around-time for results. We discussed the implications of a negative, positive and/or variant of uncertain significant result. We recommended Deborah Lambert pursue genetic testing for the [facility] Common Hereditary Cancers+RNA gene panel.   Based  on Deborah Lambert's family history of cancer, she meets medical criteria for genetic testing. Despite that she meets criteria, she may still have an out of pocket cost.   PLAN: After considering the risks, benefits, and limitations, Deborah Lambert provided informed consent to pursue genetic testing and the blood sample was sent to [facility] for analysis of the Common Hereditary Cancers+RNA panel. Results should be available within approximately 2-3 weeks' time, at which point they will be disclosed by telephone to Deborah Lambert, as will any additional recommendations warranted by these results. Deborah Lambert will receive a summary of her genetic counseling visit and a copy of her results once available. This information will also be available in Epic.   Deborah Lambert questions were answered to her satisfaction today. Our contact information was provided should additional questions or concerns arise. Thank you for the referral and allowing [place] to share in the care of your patient.   Lacy Duverney, MS, [facility] Genetic Counselor [email or web address] Phone: [phone]  The patient was seen for a total of 20 minutes in face-to-face genetic counseling.  Dr. Blake Divine was available for discussion regarding this case.   _______________________________________________________________________ For Office Staff:  Number of people involved in session: 1 Was an Intern/ student involved with case: no

## 2023-04-10 ENCOUNTER — Encounter: Payer: Self-pay | Admitting: Emergency Medicine

## 2023-04-10 ENCOUNTER — Emergency Department: Payer: 59

## 2023-04-10 ENCOUNTER — Other Ambulatory Visit: Payer: Self-pay

## 2023-04-10 ENCOUNTER — Emergency Department
Admission: EM | Admit: 2023-04-10 | Discharge: 2023-04-10 | Disposition: A | Discharge status: Home / Self Care | Payer: 59 | Attending: Emergency Medicine | Admitting: Emergency Medicine

## 2023-04-10 DIAGNOSIS — S060X0A Concussion without loss of consciousness, initial encounter: Secondary | ICD-10-CM | POA: Diagnosis not present

## 2023-04-10 DIAGNOSIS — W228XXA Striking against or struck by other objects, initial encounter: Secondary | ICD-10-CM | POA: Diagnosis not present

## 2023-04-10 DIAGNOSIS — Y93E6 Activity, residential relocation: Secondary | ICD-10-CM | POA: Insufficient documentation

## 2023-04-10 DIAGNOSIS — S0990XA Unspecified injury of head, initial encounter: Secondary | ICD-10-CM

## 2023-04-10 DIAGNOSIS — R519 Headache, unspecified: Secondary | ICD-10-CM | POA: Diagnosis not present

## 2023-04-10 MED ORDER — ONDANSETRON 4 MG PO TBDP: 4.0000 mg | ORAL_TABLET | Freq: Three times a day (TID) | ORAL | 0 refills | Status: AC | PRN

## 2023-04-10 MED ORDER — ONDANSETRON 4 MG PO TBDP
4.0000 mg | ORAL_TABLET | Freq: Once | ORAL | Status: AC
  Administered 2023-04-10: 4 mg via ORAL
  Filled 2023-04-10: qty 1

## 2023-04-10 MED ORDER — KETOROLAC TROMETHAMINE 30 MG/ML IJ SOLN
30.0000 mg | Freq: Once | INTRAMUSCULAR | Status: AC
  Administered 2023-04-10: 30 mg via INTRAMUSCULAR
  Filled 2023-04-10: qty 1

## 2023-04-10 MED ORDER — OXYCODONE-ACETAMINOPHEN 5-325 MG PO TABS
1.0000 | ORAL_TABLET | ORAL | Status: AC | PRN
  Administered 2023-04-10 (×2): 1 via ORAL
  Filled 2023-04-10 (×2): qty 1

## 2023-04-10 NOTE — Discharge Instructions (Signed)
Please take Tylenol and ibuprofen/Advil for your pain.  It is safe to take them together, or to alternate them every few hours.  Take up to 1000mg  of Tylenol at a time, up to 4 times per day.  Do not take more than 4000 mg of Tylenol in 24 hours.  For ibuprofen, take 400-600 mg, 3 - 4 times per day.  Zofran as needed for any further nausea or vomiting  In general, any activities that make you feel worse, please avoid them -often stimulation, screen time or lites

## 2023-04-10 NOTE — ED Triage Notes (Signed)
Pt in with headache, nausea and blurred vision after head injury yesterday. Pt states she was helping move furniture yesterday and a headboard fell on the top of her head. Pain to top of head and temporal area. No LOC or thinners

## 2023-04-10 NOTE — ED Provider Notes (Signed)
[facility] Provider Note    Event Date/Time   First MD Initiated Contact with Patient 04/10/23 2312     (approximate)   History   Head Injury   HPI  Deborah Lambert is a 37 y.o. female who presents to the ED for evaluation of Head Injury   Patient presents with her husband for evaluation of 2 days of dizziness, nausea and headache after a head injury that occurred about 48 hours ago.  They were putting together a new bed frame when the headboard came off and struck her on the top of her head.  Witnessed by the husband.  No syncope or seizure activity.  Since then she has had a lot of photophobia, nausea, headache.   Physical Exam   Triage Vital Signs: ED Triage Vitals [04/10/23 1946]  Encounter Vitals Group     BP 114/85     Systolic BP Percentile      Diastolic BP Percentile      Pulse Rate 75     Resp 18     Temp 98.4 F (36.9 C)     Temp Source Oral     SpO2 99 %     Weight 200 lb (90.7 kg)     Height      Head Circumference      Peak Flow      Pain Score 10     Pain Loc      Pain Education      Exclude from Growth Chart     Most recent vital signs: Vitals:   04/10/23 1946  BP: 114/85  Pulse: 75  Resp: 18  Temp: 98.4 F (36.9 C)  SpO2: 99%    General: Awake, no distress.  Photophobic, but looks well.  Has small hematoma to the top of the head without laceration or bony step-offs. CV:  Good peripheral perfusion.  Resp:  Normal effort.  Abd:  No distention.  MSK:  No deformity noted.  Neuro:  No focal deficits appreciated. Cranial nerves II through XII intact 5/5 strength and sensation in all 4 extremities Other:     ED Results / Procedures / Treatments   Labs (all labs ordered are listed, but only abnormal results are displayed) Labs Reviewed - No data to display  EKG   RADIOLOGY CT head interpreted by me without evidence of acute intracranial pathology  Official radiology report(s): CT Head Wo  Contrast  Result Date: 04/10/2023 CLINICAL DATA:  Head trauma, moderate-severe Pt in with headache, nausea and blurred vision after head injury yesterday. Pt states she was helping move furniture EXAM: CT HEAD WITHOUT CONTRAST TECHNIQUE: Contiguous axial images were obtained from the base of the skull through the vertex without intravenous contrast. RADIATION DOSE REDUCTION: This exam was performed according to the departmental dose-optimization program which includes automated exposure control, adjustment of the mA and/or kV according to patient size and/or use of iterative reconstruction technique. COMPARISON:  CT head 09/10/2016 FINDINGS: Brain: No evidence of large-territorial acute infarction. No parenchymal hemorrhage. No mass lesion. No extra-axial collection. No mass effect or midline shift. No hydrocephalus. Basilar cisterns are patent. Vascular: No hyperdense vessel. Skull: No acute fracture or focal lesion. Sinuses/Orbits: Paranasal sinuses and mastoid air cells are clear. The orbits are unremarkable. Other: None. IMPRESSION: No acute intracranial abnormality. Electronically Signed   By: Tish Frederickson M.D.   On: 04/10/2023 21:53    PROCEDURES and INTERVENTIONS:  Procedures  Medications  oxyCODONE-acetaminophen (PERCOCET/ROXICET) 5-325 MG  per tablet 1 tablet (1 tablet Oral Given 04/10/23 1951)  ketorolac (TORADOL) 30 MG/ML injection 30 mg (has no administration in time range)  ondansetron (ZOFRAN-ODT) disintegrating tablet 4 mg (has no administration in time range)     IMPRESSION / MDM / ASSESSMENT AND PLAN / ED COURSE  I reviewed the triage vital signs and the nursing notes.  Differential diagnosis includes, but is not limited to, skull fracture, ICH, concussion  {Patient presents with symptoms of an acute illness or injury that is potentially life-threatening.  Patient presents with evidence of a concussion 48 hours after a head injury.  Suitable for outpatient management.   Provided Zofran prescription for symptomatic control, discussed expectant management, PCP follow-up and return precautions.      FINAL CLINICAL IMPRESSION(S) / ED DIAGNOSES   Final diagnoses:  Concussion without loss of consciousness, initial encounter  Minor head injury, initial encounter     Rx / DC Orders   ED Discharge Orders          Ordered    ondansetron (ZOFRAN-ODT) 4 MG disintegrating tablet  Every 8 hours PRN        04/10/23 2334             Note:  This document was prepared using Dragon voice recognition software and may include unintentional dictation errors.   Delton Prairie, MD 04/10/23 [phone]

## 2023-04-15 ENCOUNTER — Other Ambulatory Visit: Payer: 59 | Admitting: Obstetrics and Gynecology

## 2023-04-15 NOTE — Patient Instructions (Signed)
Hi Ms. Deborah Lambert, I am sorry I missed you today, I hope things are okay- as a part of your Medicaid benefit, you are eligible for care management and care coordination services at no cost or copay. I was unable to reach you by phone today but would be happy to help you with your health related needs. Please feel free to call me at [phone].  A member of the Managed Medicaid care management team will reach out to you again over the next 30 business  days.   Kathi Der RN, BSN [place]  [employer], production - Managed Medicaid High Risk [phone]

## 2023-04-15 NOTE — Patient Outreach (Signed)
  Medicaid Managed Care   Unsuccessful Attempt Note   04/15/2023 Name: Deborah Lambert MRN: [id number] DOB: 30-Aug-1985  Referred by: Debera Lat, PA-C Reason for referral : High Risk Managed Medicaid (Unsuccessful telephone outreach)  An unsuccessful telephone outreach was attempted today. The patient was referred to the case management team for assistance with care management and care coordination.    Follow Up Plan: The Managed Medicaid care management team will reach out to the patient again over the next 30 business  days. and The  Patient has been provided with contact information for the Managed Medicaid care management team and has been advised to call with any health related questions or concerns.   Kathi Der RN, BSN [place]  [employer] - Managed Medicaid High Risk [phone]

## 2023-04-16 ENCOUNTER — Ambulatory Visit: Payer: Self-pay | Admitting: Licensed Clinical Social Worker

## 2023-04-16 ENCOUNTER — Encounter: Payer: Self-pay | Admitting: Licensed Clinical Social Worker

## 2023-04-16 ENCOUNTER — Telehealth: Payer: Self-pay | Admitting: Licensed Clinical Social Worker

## 2023-04-16 DIAGNOSIS — Z1379 Encounter for other screening for genetic and chromosomal anomalies: Secondary | ICD-10-CM

## 2023-04-16 NOTE — Progress Notes (Signed)
HPI:   Deborah Lambert was previously seen in the [facility] due to a family history of cancer and concerns regarding a hereditary predisposition to cancer. Please refer to our prior cancer genetics clinic note for more information regarding our discussion, assessment and recommendations, at the time. [name] recent genetic test results were disclosed to her, as were recommendations warranted by these results. These results and recommendations are discussed in more detail below.  CANCER HISTORY:  Oncology History   No history exists.    FAMILY HISTORY:  We obtained a detailed, 4-generation family history.  Significant diagnoses are listed below: Family History  Problem Relation Age of Onset   Stroke Mother    Breast cancer Mother        dx 89s, no genetic testing   Diabetes Father    Throat cancer Paternal Uncle    Breast cancer Maternal Grandmother        dx under 50   Melanoma Maternal Grandfather        metastatic, d. 41s   Breast cancer Paternal Grandmother        dx 16s   Lung cancer Paternal Grandfather    Deborah Lambert has 2 sons, 19 and 15. She has 2 brothers. One passed in an accident at 29.   Deborah Lambert mother had breast cancer in her 58s and is living in her 62s, she has not had genetic testing. Maternal grandmother had breast cancer under age 37 and is living in her 92s. Maternal grandfather passed of metastatic melanoma in his 7s, he did have history of sun exposure.   Deborah Lambert father is living in his 71s. Paternal uncle currently has throat cancer in his 30s. Paternal grandmother had breast cancer in her 62s. Grandfather passed of lung cancer, had history of smoking.   Deborah Lambert is unaware of previous family history of genetic testing for hereditary cancer risks. There is no reported Ashkenazi Jewish ancestry. There is no known consanguinity.      GENETIC TEST RESULTS:  The [facility] Common Hereditary Cancers+RNA Panel found no  pathogenic mutations.   The Common Hereditary Cancers Panel + RNA offered by [facility] includes sequencing and/or deletion duplication testing of the following 48 genes: APC*, ATM*, AXIN2, BAP1, BARD1, BMPR1A, BRCA1, BRCA2, BRIP1, CDH1, CDK4, CDKN2A (p14ARF), CDKN2A (p16INK4a), CHEK2, CTNNA1, DICER1*, EPCAM*, FH*, GREM1*, HOXB13, KIT, MBD4, MEN1*, MLH1*, MSH2*, MSH3*, MSH6*, MUTYH, NF1*, NTHL1, PALB2, PDGFRA, PMS2*, POLD1*, POLE, PTEN*, RAD51C, RAD51D, SDHA*, SDHB, SDHC*, SDHD, SMAD4, SMARCA4, STK11, TP53, TSC1*, TSC2, VHL.  The test report has been scanned into EPIC and is located under the Molecular Pathology section of the Results Review tab.  A portion of the result report is included below for reference. Genetic testing reported out on 04/16/2023.      Even though a pathogenic variant was not identified, possible explanations for the cancer in the family may include: There may be no hereditary risk for cancer in the family. The cancers in Deborah Lambert and/or her family may be sporadic/familial or due to other genetic and environmental factors. There may be a gene mutation in one of these genes that current testing methods cannot detect but that chance is small. There could be another gene that has not yet been discovered, or that we have not yet tested, that is responsible for the cancer diagnoses in the family.  It is also possible there is a hereditary cause for the cancer in the family that Deborah Lambert did not  inherit.  Therefore, it is important to remain in touch with cancer genetics in the future so that we can continue to offer [name] the most up to date genetic testing.   ADDITIONAL GENETIC TESTING:  We discussed with Deborah Lambert that her genetic testing was fairly extensive.  If there are additional relevant genes identified to increase cancer risk that can be analyzed in the future, we would be happy to discuss and coordinate this testing at that time.    CANCER SCREENING  RECOMMENDATIONS:  Deborah Lambert test result is considered negative (normal).  This means that we have not identified a hereditary cause for her family history of cancer at this time.   An individual's cancer risk and medical management are not determined by genetic test results alone. Overall cancer risk assessment incorporates additional factors, including personal medical history, family history, and any available genetic information that may result in a personalized plan for cancer prevention and surveillance. Therefore, it is recommended she continue to follow the cancer management and screening guidelines provided by her  primary healthcare provider.  Based on the reported personal and family history, specific cancer screenings for Ms. MARIANGELA HELDT and her family include:   Breast Cancer Screening:  The Tyrer-Cuzick model is one of multiple prediction models developed to estimate an individual's lifetime risk of developing breast cancer. The Tyrer-Cuzick model is endorsed by the [employer] (NCCN). This model includes many risk factors such as family history, endogenous estrogen exposure, and benign breast disease. The calculation is highly-dependent on the accuracy of clinical data provided by the patient and can change over time. The Tyrer-Cuzick model may be repeated to reflect new information in her personal or family history in the future.    Deborah Lambert Tyrer-Cuzick risk score is 24.2%.  For women with a greater than 20% lifetime risk of breast cancer, the NCCN recommends the following:    1.   Clinical encounter every 6-12 months to begin when identified as being at increased risk, but not before age 39    2.   Annual mammograms, tomosynthesis is recommended starting 10 years earlier than the youngest breast cancer diagnosis in the family or at age 47 (whichever comes first), but not before age 70     72.   Annual breast MRI starting 10 years earlier than  the youngest breast cancer diagnosis in the family or at age 94 (whichever comes first), but not before age 67    We have placed a referral for [facility].       RECOMMENDATIONS FOR FAMILY MEMBERS:   Since she did not inherit a identifiable mutation in a cancer predisposition gene included on this panel, her children could not have inherited a known mutation from her in one of these genes. Individuals in this family might be at some increased risk of developing cancer, over the general population risk, due to the family history of cancer.  Individuals in the family should notify their providers of the family history of cancer. We recommend women in this family have a yearly mammogram beginning at age 48, or 60 years younger than the earliest onset of cancer, an annual clinical breast exam, and perform monthly breast self-exams.  Family members should have colonoscopies by at age 2, or earlier, as recommended by their providers. Other members of the family may still carry a pathogenic variant in one of these genes that Ms. Janusz did not inherit. Based on the  family history, we recommend her mother have genetic counseling and testing. Ms. Hamme will let us know if we can be of any assistance in coordinating genetic counseling and/or testing for this family member.  Her mother's genetic testing could also change her Rockne Menghini risk score.   FOLLOW-UP:  Lastly, we discussed with Ms. Oo that cancer genetics is a rapidly advancing field and it is possible that new genetic tests will be appropriate for her and/or her family members in the future. We encouraged her to remain in contact with cancer genetics on an annual basis so we can update her personal and family histories and let her know of advances in cancer genetics that may benefit this family.   Our contact number was provided. Ms. Gudiel questions were answered to her satisfaction, and she knows she is welcome to  call us at anytime with additional questions or concerns.    Lacy Duverney, MS, [facility] Genetic Counselor [email or web address] Phone: [phone]

## 2023-04-16 NOTE — Telephone Encounter (Signed)
I contacted Deborah Lambert to discuss her genetic testing results. No pathogenic variants were identified in the 48 genes analyzed. Detailed clinic note to follow.   The test report has been scanned into EPIC and is located under the Molecular Pathology section of the Results Review tab.  A portion of the result report is included below for reference.      Lacy Duverney, MS, [facility] Genetic Counselor [email or web address] Phone: [phone]

## 2023-04-17 ENCOUNTER — Ambulatory Visit (INDEPENDENT_AMBULATORY_CARE_PROVIDER_SITE_OTHER): Payer: 59 | Admitting: Physician Assistant

## 2023-04-17 VITALS — BP 106/74 | HR 78 | Temp 97.8°F | Ht 65.5 in | Wt 195.0 lb

## 2023-04-17 DIAGNOSIS — S069XAD Unspecified intracranial injury with loss of consciousness status unknown, subsequent encounter: Secondary | ICD-10-CM | POA: Diagnosis not present
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
## 2023-04-18 DIAGNOSIS — G514 Facial myokymia: Secondary | ICD-10-CM | POA: Diagnosis not present

## 2023-04-18 DIAGNOSIS — S060XAA Concussion with loss of consciousness status unknown, initial encounter: Secondary | ICD-10-CM | POA: Diagnosis not present

## 2023-04-18 DIAGNOSIS — G453 Amaurosis fugax: Secondary | ICD-10-CM | POA: Diagnosis not present

## 2023-04-18 DIAGNOSIS — H5213 Myopia, bilateral: Secondary | ICD-10-CM | POA: Diagnosis not present

## 2023-04-22 DIAGNOSIS — R051 Acute cough: Secondary | ICD-10-CM | POA: Diagnosis not present

## 2023-04-22 DIAGNOSIS — J3089 Other allergic rhinitis: Secondary | ICD-10-CM | POA: Diagnosis not present

## 2023-04-22 DIAGNOSIS — J4540 Moderate persistent asthma, uncomplicated: Secondary | ICD-10-CM | POA: Diagnosis not present

## 2023-04-22 DIAGNOSIS — L2089 Other atopic dermatitis: Secondary | ICD-10-CM | POA: Diagnosis not present

## 2023-04-23 DIAGNOSIS — H5213 Myopia, bilateral: Secondary | ICD-10-CM | POA: Diagnosis not present

## 2023-04-24 ENCOUNTER — Telehealth: Payer: Self-pay

## 2023-04-24 DIAGNOSIS — L2089 Other atopic dermatitis: Secondary | ICD-10-CM | POA: Diagnosis not present

## 2023-04-24 DIAGNOSIS — R052 Subacute cough: Secondary | ICD-10-CM | POA: Diagnosis not present

## 2023-04-24 DIAGNOSIS — J3089 Other allergic rhinitis: Secondary | ICD-10-CM | POA: Diagnosis not present

## 2023-04-24 DIAGNOSIS — J4540 Moderate persistent asthma, uncomplicated: Secondary | ICD-10-CM | POA: Diagnosis not present

## 2023-04-24 NOTE — Telephone Encounter (Signed)
Copied from CRM [id number]. Topic: General - Other >> Apr 24, 2023 12:15 PM Phill Myron wrote: Update from: Demerith at [facility])  ....Marland KitchenMarland KitchenMarland Kitchen Ref# [id number]

## 2023-04-25 ENCOUNTER — Inpatient Hospital Stay: Payer: 59

## 2023-04-25 ENCOUNTER — Inpatient Hospital Stay (HOSPITAL_BASED_OUTPATIENT_CLINIC_OR_DEPARTMENT_OTHER): Payer: 59 | Admitting: Internal Medicine

## 2023-04-25 ENCOUNTER — Telehealth: Payer: Self-pay

## 2023-04-25 ENCOUNTER — Inpatient Hospital Stay: Payer: Medicaid Other | Admitting: Internal Medicine

## 2023-04-25 VITALS — Temp 98.3°F | Wt 195.0 lb

## 2023-04-25 DIAGNOSIS — Z8481 Family history of carrier of genetic disease: Secondary | ICD-10-CM

## 2023-04-25 DIAGNOSIS — Z808 Family history of malignant neoplasm of other organs or systems: Secondary | ICD-10-CM

## 2023-04-25 DIAGNOSIS — N649 Disorder of breast, unspecified: Secondary | ICD-10-CM | POA: Diagnosis not present

## 2023-04-25 DIAGNOSIS — Z9189 Other specified personal risk factors, not elsewhere classified: Secondary | ICD-10-CM | POA: Diagnosis not present

## 2023-04-25 DIAGNOSIS — Z803 Family history of malignant neoplasm of breast: Secondary | ICD-10-CM | POA: Diagnosis not present

## 2023-04-25 DIAGNOSIS — Z801 Family history of malignant neoplasm of trachea, bronchus and lung: Secondary | ICD-10-CM

## 2023-04-25 NOTE — Telephone Encounter (Signed)
Reminder letter sent to the patient.

## 2023-04-25 NOTE — Progress Notes (Signed)
Patient is having some pain from were she had her concussion. She also has a spot on her left side of her left breast.

## 2023-04-26 ENCOUNTER — Ambulatory Visit: Payer: 59 | Admitting: Licensed Clinical Social Worker

## 2023-04-26 DIAGNOSIS — Z8481 Family history of carrier of genetic disease: Secondary | ICD-10-CM | POA: Insufficient documentation

## 2023-04-26 DIAGNOSIS — Z803 Family history of malignant neoplasm of breast: Secondary | ICD-10-CM | POA: Insufficient documentation

## 2023-04-26 DIAGNOSIS — Z9189 Other specified personal risk factors, not elsewhere classified: Secondary | ICD-10-CM | POA: Insufficient documentation

## 2023-04-26 NOTE — Progress Notes (Signed)
[facility]  [phone] [phone]  ID: DAIRIN REBURN OB: [birth year]  MR#: [id number]  PPI#:[id number]  Patient Care Team: Debera Lat, Cordelia Poche as PCP - General (Physician Assistant) Patient, No Pcp Per (General Practice) Michaelyn Barter, MD as Consulting Physician (Oncology)  REFERRING PROVIDER: Lacy Duverney  REASON FOR REFERRAL: high risk breast  HPI: Deborah MCPHERON is a 37 y.o. female with past medical history of anxiety, PTSD, seizure was referred to medical oncology for surveillance of high risk breast.  Patient noticed palpable masses in her bilateral breast recently.  Had diagnostic mammogram and targeted ultrasound on 04/04/2023 which showed benign left breast cyst, intradermal lesion favored to represent a sebaceous cyst at the area of palpable abnormality in the left breast.  No mammographic or sonographic abnormality noted in the right breast.  Reports she noticed another new mass in her right breast post mammogram.  Reports history of plate in her right arm for fracture.  Tells that she was told she can never have an MRI due to incompatibility.  Patient reports family history of breast cancer in her mother in her 30s.  Maternal grandmother had breast cancer under age 43s and is living in her 48s.  Maternal grandfather passed of metastatic melanoma in 79s.  Invitae gene panel was negative for high risk mutation.  The Tyrer-Cuzick model showed lifetime risk of developing breast cancer of 24.2%.   REVIEW OF SYSTEMS:   ROS  As per HPI. Otherwise, a complete review of systems is negative.  PAST MEDICAL HISTORY: Past Medical History:  Diagnosis Date   Anxiety    Bell's palsy    Cluster headaches    Focal seizures (HCC)    PTSD (post-traumatic stress disorder)    Seizures (HCC)     PAST SURGICAL HISTORY: Past Surgical History:  Procedure Laterality Date   right arm surgery      FAMILY HISTORY: Family History   Problem Relation Age of Onset   Stroke Mother    Breast cancer Mother        dx 37s, no genetic testing   Diabetes Father    Throat cancer Paternal Uncle    Breast cancer Maternal Grandmother        dx under 50   Melanoma Maternal Grandfather        metastatic, d. 70s   Breast cancer Paternal Grandmother        dx 6s   Lung cancer Paternal Grandfather     HEALTH MAINTENANCE: Social History   Tobacco Use   Smoking status: Never   Smokeless tobacco: Never  Vaping Use   Vaping status: Never Used  Substance Use Topics   Alcohol use: Never   Drug use: Never     Allergies  Allergen Reactions   Aspirin Shortness Of Breath and Rash   Codeine Shortness Of Breath and Rash   Aspirin Hives   Codeine Hives   Gold Sodium Thiomalate    Almond Oil Rash   Coconut (Cocos Nucifera) Rash    Current Outpatient Medications  Medication Sig Dispense Refill   albuterol (VENTOLIN HFA) 108 (90 Base) MCG/ACT inhaler Inhale 2 puffs into the lungs every 6 (six) hours as needed for wheezing or shortness of breath. Inhale into the lungs every 6 (six) hours as needed for wheezing or shortness of breath. 18 g 0   fluocinonide cream (LIDEX) 0.05 % Apply 1 Application topically 2 (two) times daily. 30 g 1   Multiple Vitamin (MULTIVITAMIN) tablet  Take 1 tablet by mouth daily.     omeprazole (PRILOSEC) 20 MG capsule Take 1 capsule (20 mg total) by mouth daily. 30 capsule 0   ondansetron (ZOFRAN-ODT) 4 MG disintegrating tablet Take 1 tablet (4 mg total) by mouth every 8 (eight) hours as needed. 20 tablet 0   triamcinolone ointment (KENALOG) 0.5 % Apply 1 Application topically 2 (two) times daily. 30 g 0   No current facility-administered medications for this visit.    OBJECTIVE: Vitals:   04/25/23 1427  Temp: 98.3 F (36.8 C)  SpO2: 99%     Body mass index is 31.96 kg/m.      General: Well-developed, well-nourished, no acute distress. Eyes: Pink conjunctiva, anicteric sclera. HEENT:  Normocephalic, moist mucous membranes, clear oropharnyx. Lungs: Clear to auscultation bilaterally. Heart: Regular rate and rhythm. No rubs, murmurs, or gallops. Abdomen: Soft, nontender, nondistended. No organomegaly noted, normoactive bowel sounds. Musculoskeletal: No edema, cyanosis, or clubbing. Neuro: Alert, answering all questions appropriately. Cranial nerves grossly intact. Skin: No rashes or petechiae noted. Psych: Normal affect. Lymphatics: No cervical, calvicular, axillary or inguinal LAD.   LAB RESULTS:  Lab Results  Component Value Date   NA 140 11/13/2022   K 4.2 11/13/2022   CL 105 11/13/2022   CO2 20 11/13/2022   GLUCOSE 105 (H) 11/13/2022   BUN 11 11/13/2022   CREATININE 0.78 11/13/2022   CALCIUM 9.2 11/13/2022   PROT 7.2 10/31/2022   ALBUMIN 4.4 10/31/2022   AST 18 10/31/2022   ALT 18 10/31/2022   ALKPHOS 72 10/31/2022   BILITOT 0.5 10/31/2022   GFRNONAA >60 09/10/2022   GFRAA >60 06/01/2019    Lab Results  Component Value Date   WBC 7.7 10/31/2022   NEUTROABS 5.1 10/31/2022   HGB 14.1 10/31/2022   HCT 42.0 10/31/2022   MCV 88 10/31/2022   PLT 242 10/31/2022    No results found for: "TIBC", "FERRITIN", "IRONPCTSAT"   STUDIES: CT Head Wo Contrast  Result Date: 04/10/2023 CLINICAL DATA:  Head trauma, moderate-severe Pt in with headache, nausea and blurred vision after head injury yesterday. Pt states she was helping move furniture EXAM: CT HEAD WITHOUT CONTRAST TECHNIQUE: Contiguous axial images were obtained from the base of the skull through the vertex without intravenous contrast. RADIATION DOSE REDUCTION: This exam was performed according to the departmental dose-optimization program which includes automated exposure control, adjustment of the mA and/or kV according to patient size and/or use of iterative reconstruction technique. COMPARISON:  CT head 09/10/2016 FINDINGS: Brain: No evidence of large-territorial acute infarction. No parenchymal  hemorrhage. No mass lesion. No extra-axial collection. No mass effect or midline shift. No hydrocephalus. Basilar cisterns are patent. Vascular: No hyperdense vessel. Skull: No acute fracture or focal lesion. Sinuses/Orbits: Paranasal sinuses and mastoid air cells are clear. The orbits are unremarkable. Other: None. IMPRESSION: No acute intracranial abnormality. Electronically Signed   By: Tish Frederickson M.D.   On: 04/10/2023 21:53   MM 3D DIAGNOSTIC MAMMOGRAM BILATERAL BREAST  Result Date: 04/04/2023 CLINICAL DATA:  Patient reports palpable abnormality in the left breast. Patient states that the palpable finding has decreased in size since she first noticed it and was previously much more painful. As well as palpable abnormality with associated tenderness in the upper outer right breast and along the inframammary fold right breast. Baseline exam. Family history of breast cancer includes patient's paternal and maternal grandmothers. EXAM: DIGITAL DIAGNOSTIC BILATERAL MAMMOGRAM WITH TOMOSYNTHESIS AND CAD; ULTRASOUND LEFT BREAST LIMITED; ULTRASOUND RIGHT BREAST LIMITED TECHNIQUE: Bilateral  digital diagnostic mammography and breast tomosynthesis was performed. The images were evaluated with computer-aided detection. ; Targeted ultrasound examination of the left breast was performed.; Targeted ultrasound examination of the right breast was performed COMPARISON:  None available. ACR Breast Density Category b: There are scattered areas of fibroglandular density. FINDINGS: RIGHT breast: A metallic BB applied to the skin denotes the patient indicated area of palpable abnormality in the upper outer breast at posterior depth. No underlying mammographic abnormality. No suspicious mass, calcifications or other findings in the right breast. On physical exam, I do not appreciate a suspicious mass in the upper outer or lower right breast. Targeted ultrasound of the right breast at the 9:30 position 12 cm from the nipple, at  the patient indicated area of palpable abnormality, was performed. No suspicious solid or cystic mass. Targeted ultrasound of the right breast at the 4-6 o'clock positions 11 cm the nipple, at the patient indicated area of palpable abnormality, was performed. No suspicious solid or cystic mass. LEFT breast: Metallic BB applied to the skin denotes the patient indicated area of palpable abnormality in the outer left breast at middle depth. On spot tangential view, there is very slight skin thickening directly underlying the skin marker. No underlying mammographic abnormality in the breast parenchyma. Incidentally noted in the upper central breast at posterior depth is a 1.4 cm oval, slightly lobulated, circumscribed mass with associated layering calcifications. On physical exam, I note a 0.5 cm slightly erythematous skin lesion in the upper outer left breast. Targeted ultrasound of the left breast 2 o'clock position 8 cm from the nipple, at the patient indicated area of palpable abnormality, demonstrates a 0.7 x 0.1 x 0.5 cm intradermal, nearly anechoic fluid collection versus mass with tract to the skin. No internal vascularity. Targeted ultrasound of the left breast at the 1 o'clock position 7 cm from the nipple demonstrates a cluster of oval anechoic circumscribed cysts versus 2 adjacent cysts, in total spanning 1.1 x 0.9 x 0.5 cm, corresponding with the mammographic mass in the upper central breast. IMPRESSION: 1. An intradermal lesion, favored to represent a sebaceous cyst (epidermal inclusion cyst) accounts for the patient's palpable abnormality in the left breast. 2. Incidentally noted benign left breast cysts. 3. No mammographic or sonographic abnormality at the patient indicated areas of palpable findings in the right breast. 4. No mammographic findings of malignancy in either breast. RECOMMENDATION: Any further workup of the patient's palpable findings in the right breast should be based on the clinical  assessment. Recommend clinical follow-up for the skin lesion in the left breast. Given patient's family history of breast cancer, recommend formal breast cancer risk assessment if not yet already performed. According to the [employer] of Radiology recommendations, for women with a calculated lifetime risk of 20% or more, breast MRI should be performed annually beginning at age 101 to 44. These women should start annual mammography at ages 29-40, depending on type of risk ( "Breast Cancer Screening in Women at Higher-Than-Average Risk: Updated Recommendations From the ACR", published May 2023). If patient's lifetime risk of breast cancer is less than 20%, recommend starting annual screening mammograms at age 65. I have discussed the findings and recommendations with the patient. If applicable, a reminder letter will be sent to the patient regarding the next appointment. BI-RADS CATEGORY  2: Benign. Electronically Signed   By: Sherron Ales M.D.   On: 04/04/2023 10:28   [place] LIMITED ULTRASOUND INCLUDING AXILLA LEFT BREAST   Result Date: 04/04/2023 CLINICAL  DATA:  Patient reports palpable abnormality in the left breast. Patient states that the palpable finding has decreased in size since she first noticed it and was previously much more painful. As well as palpable abnormality with associated tenderness in the upper outer right breast and along the inframammary fold right breast. Baseline exam. Family history of breast cancer includes patient's paternal and maternal grandmothers. EXAM: DIGITAL DIAGNOSTIC BILATERAL MAMMOGRAM WITH TOMOSYNTHESIS AND CAD; ULTRASOUND LEFT BREAST LIMITED; ULTRASOUND RIGHT BREAST LIMITED TECHNIQUE: Bilateral digital diagnostic mammography and breast tomosynthesis was performed. The images were evaluated with computer-aided detection. ; Targeted ultrasound examination of the left breast was performed.; Targeted ultrasound examination of the right breast was performed COMPARISON:  None  available. ACR Breast Density Category b: There are scattered areas of fibroglandular density. FINDINGS: RIGHT breast: A metallic BB applied to the skin denotes the patient indicated area of palpable abnormality in the upper outer breast at posterior depth. No underlying mammographic abnormality. No suspicious mass, calcifications or other findings in the right breast. On physical exam, I do not appreciate a suspicious mass in the upper outer or lower right breast. Targeted ultrasound of the right breast at the 9:30 position 12 cm from the nipple, at the patient indicated area of palpable abnormality, was performed. No suspicious solid or cystic mass. Targeted ultrasound of the right breast at the 4-6 o'clock positions 11 cm the nipple, at the patient indicated area of palpable abnormality, was performed. No suspicious solid or cystic mass. LEFT breast: Metallic BB applied to the skin denotes the patient indicated area of palpable abnormality in the outer left breast at middle depth. On spot tangential view, there is very slight skin thickening directly underlying the skin marker. No underlying mammographic abnormality in the breast parenchyma. Incidentally noted in the upper central breast at posterior depth is a 1.4 cm oval, slightly lobulated, circumscribed mass with associated layering calcifications. On physical exam, I note a 0.5 cm slightly erythematous skin lesion in the upper outer left breast. Targeted ultrasound of the left breast 2 o'clock position 8 cm from the nipple, at the patient indicated area of palpable abnormality, demonstrates a 0.7 x 0.1 x 0.5 cm intradermal, nearly anechoic fluid collection versus mass with tract to the skin. No internal vascularity. Targeted ultrasound of the left breast at the 1 o'clock position 7 cm from the nipple demonstrates a cluster of oval anechoic circumscribed cysts versus 2 adjacent cysts, in total spanning 1.1 x 0.9 x 0.5 cm, corresponding with the mammographic  mass in the upper central breast. IMPRESSION: 1. An intradermal lesion, favored to represent a sebaceous cyst (epidermal inclusion cyst) accounts for the patient's palpable abnormality in the left breast. 2. Incidentally noted benign left breast cysts. 3. No mammographic or sonographic abnormality at the patient indicated areas of palpable findings in the right breast. 4. No mammographic findings of malignancy in either breast. RECOMMENDATION: Any further workup of the patient's palpable findings in the right breast should be based on the clinical assessment. Recommend clinical follow-up for the skin lesion in the left breast. Given patient's family history of breast cancer, recommend formal breast cancer risk assessment if not yet already performed. According to the [employer] of Radiology recommendations, for women with a calculated lifetime risk of 20% or more, breast MRI should be performed annually beginning at age 80 to 71. These women should start annual mammography at ages 23-40, depending on type of risk ( "Breast Cancer Screening in Women at Higher-Than-Average Risk: Updated Recommendations  From the ACR", published May 2023). If patient's lifetime risk of breast cancer is less than 20%, recommend starting annual screening mammograms at age 44. I have discussed the findings and recommendations with the patient. If applicable, a reminder letter will be sent to the patient regarding the next appointment. BI-RADS CATEGORY  2: Benign. Electronically Signed   By: Sherron Ales M.D.   On: 04/04/2023 10:28   [place] LIMITED ULTRASOUND INCLUDING AXILLA RIGHT BREAST  Result Date: 04/04/2023 CLINICAL DATA:  Patient reports palpable abnormality in the left breast. Patient states that the palpable finding has decreased in size since she first noticed it and was previously much more painful. As well as palpable abnormality with associated tenderness in the upper outer right breast and along the inframammary fold  right breast. Baseline exam. Family history of breast cancer includes patient's paternal and maternal grandmothers. EXAM: DIGITAL DIAGNOSTIC BILATERAL MAMMOGRAM WITH TOMOSYNTHESIS AND CAD; ULTRASOUND LEFT BREAST LIMITED; ULTRASOUND RIGHT BREAST LIMITED TECHNIQUE: Bilateral digital diagnostic mammography and breast tomosynthesis was performed. The images were evaluated with computer-aided detection. ; Targeted ultrasound examination of the left breast was performed.; Targeted ultrasound examination of the right breast was performed COMPARISON:  None available. ACR Breast Density Category b: There are scattered areas of fibroglandular density. FINDINGS: RIGHT breast: A metallic BB applied to the skin denotes the patient indicated area of palpable abnormality in the upper outer breast at posterior depth. No underlying mammographic abnormality. No suspicious mass, calcifications or other findings in the right breast. On physical exam, I do not appreciate a suspicious mass in the upper outer or lower right breast. Targeted ultrasound of the right breast at the 9:30 position 12 cm from the nipple, at the patient indicated area of palpable abnormality, was performed. No suspicious solid or cystic mass. Targeted ultrasound of the right breast at the 4-6 o'clock positions 11 cm the nipple, at the patient indicated area of palpable abnormality, was performed. No suspicious solid or cystic mass. LEFT breast: Metallic BB applied to the skin denotes the patient indicated area of palpable abnormality in the outer left breast at middle depth. On spot tangential view, there is very slight skin thickening directly underlying the skin marker. No underlying mammographic abnormality in the breast parenchyma. Incidentally noted in the upper central breast at posterior depth is a 1.4 cm oval, slightly lobulated, circumscribed mass with associated layering calcifications. On physical exam, I note a 0.5 cm slightly erythematous skin lesion  in the upper outer left breast. Targeted ultrasound of the left breast 2 o'clock position 8 cm from the nipple, at the patient indicated area of palpable abnormality, demonstrates a 0.7 x 0.1 x 0.5 cm intradermal, nearly anechoic fluid collection versus mass with tract to the skin. No internal vascularity. Targeted ultrasound of the left breast at the 1 o'clock position 7 cm from the nipple demonstrates a cluster of oval anechoic circumscribed cysts versus 2 adjacent cysts, in total spanning 1.1 x 0.9 x 0.5 cm, corresponding with the mammographic mass in the upper central breast. IMPRESSION: 1. An intradermal lesion, favored to represent a sebaceous cyst (epidermal inclusion cyst) accounts for the patient's palpable abnormality in the left breast. 2. Incidentally noted benign left breast cysts. 3. No mammographic or sonographic abnormality at the patient indicated areas of palpable findings in the right breast. 4. No mammographic findings of malignancy in either breast. RECOMMENDATION: Any further workup of the patient's palpable findings in the right breast should be based on the clinical assessment. Recommend clinical  follow-up for the skin lesion in the left breast. Given patient's family history of breast cancer, recommend formal breast cancer risk assessment if not yet already performed. According to the [employer] of [employer]iology recommendations, for women with a calculated lifetime risk of 20% or more, breast MRI should be performed annually beginning at age 70 to 21. These women should start annual mammography at ages 46-40, depending on type of risk ( "Breast Cancer Screening in Women at Higher-Than-Average Risk: Updated Recommendations From the ACR", published May 2023). If patient's lifetime risk of breast cancer is less than 20%, recommend starting annual screening mammograms at age 72. I have discussed the findings and recommendations with the patient. If applicable, a reminder letter will be sent to  the patient regarding the next appointment. BI-RADS CATEGORY  2: Benign. Electronically Signed   By: Sherron Ales M.D.   On: 04/04/2023 10:28    ASSESSMENT AND PLAN:   TAMETHA NOWAK is a 37 y.o. female with pmh of anxiety, PTSD, seizure was referred to medical oncology for surveillance of high risk breast.  # High risk breast # Family history of breast cancer - Patient noticed palpable masses in her bilateral breast recently.  Had diagnostic mammogram and targeted ultrasound on 04/04/2023 which showed benign left breast cyst, intradermal lesion favored to represent a sebaceous cyst at the area of palpable abnormality in the left breast.  No mammographic or sonographic abnormality noted in the right breast.  Reports she noticed another new mass in her right breast post mammogram.    - Invitae gene panel was negative for high risk mutation. Tyrer-Cuzick model showed lifetime risk of developing breast cancer of 24.2%.  -Considering patient's lifetime risk is greater than 20% she will benefit from closer screening surveillance.  We discussed about alternating with screening mammogram and MRI.  Patient reports a history of orthopedic surgery with implant in her right arm for fracture.  Reports she was told implant was MRI incompatible.  I discussed with Dr. Kerby Nora about the options for screening modalities.  Favors to obtain more detailed information about her implant placement since most of the implants are MRI compatible now a days.  I will talk to the patient about this further next visit.  If she is not able to get MRI, options would include getting contrast enhanced mammogram once a year which can be done at [facility] or a screening mammogram alternating with screening ultrasound which is only done at [facility].  We also discussed about clinic exam every 6 to 12 months.  Self breast exam once a month.  There is also a small brownish lesion on the left breast which was initially erythematous.  Likely  sebaceous cyst.  Will follow-up in 1 month.  RTC in 1 month for MD visit.  Patient expressed understanding and was in agreement with this plan. She also understands that She can call clinic at any time with any questions, concerns, or complaints.   I spent a total of 45 minutes reviewing chart data, face-to-face evaluation with the patient, counseling and coordination of care as detailed above.  Michaelyn Barter, MD   04/26/2023 3:19 PM

## 2023-04-29 ENCOUNTER — Ambulatory Visit (INDEPENDENT_AMBULATORY_CARE_PROVIDER_SITE_OTHER): Payer: 59 | Admitting: Family Medicine

## 2023-04-29 ENCOUNTER — Ambulatory Visit (INDEPENDENT_AMBULATORY_CARE_PROVIDER_SITE_OTHER): Payer: 59

## 2023-04-29 ENCOUNTER — Encounter: Payer: Self-pay | Admitting: Family Medicine

## 2023-04-29 VITALS — BP 110/82 | HR 77 | Ht 65.5 in | Wt 195.0 lb

## 2023-04-29 DIAGNOSIS — R42 Dizziness and giddiness: Secondary | ICD-10-CM

## 2023-04-29 DIAGNOSIS — M542 Cervicalgia: Secondary | ICD-10-CM | POA: Diagnosis not present

## 2023-04-29 DIAGNOSIS — S060X9A Concussion with loss of consciousness of unspecified duration, initial encounter: Secondary | ICD-10-CM

## 2023-04-29 MED ORDER — NORTRIPTYLINE HCL 25 MG PO CAPS: 25.0000 mg | ORAL_CAPSULE | Freq: Every day | ORAL | 2 refills | Status: DC

## 2023-04-29 MED ORDER — MECLIZINE HCL 25 MG PO TABS: 25.0000 mg | ORAL_TABLET | Freq: Three times a day (TID) | ORAL | 3 refills | Status: DC | PRN

## 2023-04-29 NOTE — Patient Instructions (Signed)
Thank you for coming in today.   I've referred you to Physical Therapy.  Let us know if you don't hear from them in one week.   Take the nortryptline at bedtime for headache prevention and sleep.   Use meclezine as needed for dizzy or seasickness  Plan for PT for eyes, dizzy and neck pain/headache.   Recheck in 2 weeks.   Let me know if this is not working.Marland Kitchen

## 2023-04-29 NOTE — Progress Notes (Signed)
Subjective:   I, Stevenson Clinch, CMA acting as a scribe for Clementeen Graham, MD.  Chief Complaint: Deborah Lambert,  is a 37 y.o. female who presents for initial evaluation of a head injury. She was putting together a new bed frame when the headboard came off and struck her on the top of her head. Pt was seen at the [facility] 2 days after the injury c/o HA, photophobia, nausea, and dizziness.  Today, pt c/o cont'd sensitivity to light, constant HA and pressure, and trouble sleeping. Denies head laceration but does not swelling at time of injury. Notes brief LOC. Has tried Tylenol with minimal relief of sx. Has also tried wearing sunglasses, taking anti-nausea meds, and using heat/ice which caused a tingling sensation with use. Notes not sleeping x 5 days after injury, stayed awake crying and screaming. Hx of cluster HA, last 7 years ago. Denies prior DX of ADD/ADHD.   Dx imaging: 04/10/23 Heat CT  Injury date: 04/08/23 Visit #: 1  History of Present Illness:   Concussion Self-Reported Symptom Score Symptoms rated on a scale 1-6, in last 24 hours  Headache: 6   Pressure in head: 6 Neck pain: 6 Nausea or vomiting: 3 Dizziness: 5  Blurred vision: 6  Balance problems: 5 Sensitivity to light:  6 Sensitivity to noise: 4 Feeling slowed down: 4 Feeling like "in a fog": 6 "Don't feel right": 5 Difficulty concentrating: 4 Difficulty remembering: 5 Fatigue or low energy: 4 Confusion: 3 Drowsiness: 3 More emotional: 2 Irritability: 6 Sadness: 0 Nervous or anxious: 2 Trouble falling asleep: 6   Total # of Symptoms: 21/22 Total Symptom Score: 99/132  Tinnitus: Yes  Review of Systems: No fevers or chills    Review of History: Positive history of for depression and PTSD.  She lives in [place].  Objective:    Physical Examination Vitals:   04/29/23 1012  BP: 110/82  Pulse: 77  SpO2: 98%   MSK: C-spine: Normal appearing Tender palpation perispinal musculature  diffusely across upper portion of cervical spine.  Normal cervical motion.  Upper extremity strength is intact. Neuro: Alert and oriented.  Impaired balance and intact coordination. Psych: Normal speech thought process.  Tearful affect at times.  No SI or HI expressed.     Imaging:  X-ray images C-spine obtained today personally and independently interpreted No acute fractures.  Minimal degenerative changes. Await formal radiology review  Assessment and Plan   37 y.o. female with concussion occurring about 20 days ago after she was struck in the head after trying to assemble a bed frame.  She has symptoms in multiple domains.  Fortunately CT scan of her head was normal in the emergency room on October 9.  Headache: Not well-controlled.  Will add nortriptyline and recheck in 2 weeks.  Will use Topamax or propranolol as next steps if needed.  Neck pain: Not well-controlled either.  X-ray today is reassuring per my read however radiology overread is still pending.  Plan for physical therapy.  Dizziness/vestibular dysfunction: Plan for vestibular physical therapy.  She lives in [place] so we will use the [facility] location.  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  Plan for vestibular PT.  He was seen by an optometrist who gave her some new glasses.  Insomnia: Again nortriptyline.  Mood: Worsening mood symptoms.  She does have a history of depression and PTSD after her brother was murdered.  She has taken fluoxetine in the past.  Will  keep a close eye on her current symptoms and add fluoxetine if needed in the future.  Recheck in 2 weeks.     Action/Discussion: Reviewed diagnosis, management options, expected outcomes, and the reasons for scheduled and emergent follow-up. Questions were adequately answered. Patient expressed verbal understanding and agreement with the following plan.     Patient Education: Reviewed with patient the risks (i.e, a  repeat concussion, post-concussion syndrome, second-impact syndrome) of returning to play prior to complete resolution, and thoroughly reviewed the signs and symptoms of concussion.Reviewed need for complete resolution of all symptoms, with rest AND exertion, prior to return to play. Reviewed red flags for urgent medical evaluation: worsening symptoms, nausea/vomiting, intractable headache, musculoskeletal changes, focal neurological deficits. [facility] Concussion Care Plan, which clearly outlines the plans stated above, was given to patient.   Level of service: Total encounter time 45 minutes including face-to-face time with the patient and, reviewing past medical record, and charting on the date of service.        After Visit Summary printed out and provided to patient as appropriate.  The above documentation has been reviewed and is accurate and complete Clementeen Graham

## 2023-05-02 NOTE — Progress Notes (Signed)
Cervical spine x-ray looks normal to radiology.

## 2023-05-07 ENCOUNTER — Ambulatory Visit (INDEPENDENT_AMBULATORY_CARE_PROVIDER_SITE_OTHER): Payer: 59 | Admitting: Physician Assistant

## 2023-05-07 VITALS — BP 126/84 | HR 83 | Temp 98.1°F | Resp 20 | Ht 65.0 in

## 2023-05-07 DIAGNOSIS — Z8481 Family history of carrier of genetic disease: Secondary | ICD-10-CM

## 2023-05-07 DIAGNOSIS — J45909 Unspecified asthma, uncomplicated: Secondary | ICD-10-CM

## 2023-05-07 DIAGNOSIS — F419 Anxiety disorder, unspecified: Secondary | ICD-10-CM | POA: Diagnosis not present

## 2023-05-07 DIAGNOSIS — Z9189 Other specified personal risk factors, not elsewhere classified: Secondary | ICD-10-CM

## 2023-05-07 DIAGNOSIS — F0781 Postconcussional syndrome: Secondary | ICD-10-CM

## 2023-05-07 DIAGNOSIS — L309 Dermatitis, unspecified: Secondary | ICD-10-CM

## 2023-05-07 NOTE — Progress Notes (Unsigned)
Established patient visit  Patient: Deborah Lambert   DOB: [birth year]   37 y.o. Female  MRN: [id number] Visit Date: 05/07/2023  Today's healthcare provider: Debera Lat, PA-C   Chief Complaint  Patient presents with   Follow-up    Severe depression and asthma and not able to work and current concussion from hit to head Possible bug bite on back    Subjective    HPI HPI     Follow-up    Additional comments: Severe depression and asthma and not able to work and current concussion from hit to head Possible bug bite on back       Last edited by Clois Comber on 05/07/2023  4:15 PM.      *** Discussed the use of AI scribe software for clinical note transcription with the patient, who gave verbal consent to proceed.  History of Present Illness               05/07/2023    4:17 PM 11/12/2022    4:08 PM 10/31/2022    9:47 AM  Depression screen PHQ 2/9  Decreased Interest 0 1 1  Down, Depressed, Hopeless 1 0 1  PHQ - 2 Score 1 1 2   Altered sleeping 1 1 3   Tired, decreased energy 1 1 3   Change in appetite 1 1 3   Feeling bad or failure about yourself  0 1 1  Trouble concentrating 0 0 1  Moving slowly or fidgety/restless 0 0 0  Suicidal thoughts 0 0 0  PHQ-9 Score 4 5 13   Difficult doing work/chores Somewhat difficult Not difficult at all Somewhat difficult      10/31/2022    9:50 AM  GAD 7 : Generalized Anxiety Score  Nervous, Anxious, on Edge 1  Control/stop worrying 3  Worry too much - different things 3  Trouble relaxing 3  Restless 3  Easily annoyed or irritable 3  Afraid - awful might happen 0  Total GAD 7 Score 16  Anxiety Difficulty Somewhat difficult    Medications: Outpatient Medications Prior to Visit  Medication Sig   albuterol (VENTOLIN HFA) 108 (90 Base) MCG/ACT inhaler Inhale 2 puffs into the lungs every 6 (six) hours as needed for wheezing or shortness of breath. Inhale into the lungs every 6 (six) hours as needed for wheezing or shortness  of breath.   fluocinonide cream (LIDEX) 0.05 % Apply 1 Application topically 2 (two) times daily.   meclizine (ANTIVERT) 25 MG tablet Take 1 tablet (25 mg total) by mouth 3 (three) times daily as needed for dizziness or nausea.   Multiple Vitamin (MULTIVITAMIN) tablet Take 1 tablet by mouth daily.   nortriptyline (PAMELOR) 25 MG capsule Take 1 capsule (25 mg total) by mouth at bedtime.   ondansetron (ZOFRAN-ODT) 4 MG disintegrating tablet Take 1 tablet (4 mg total) by mouth every 8 (eight) hours as needed.   triamcinolone ointment (KENALOG) 0.5 % Apply 1 Application topically 2 (two) times daily.   omeprazole (PRILOSEC) 20 MG capsule Take 1 capsule (20 mg total) by mouth daily.   No facility-administered medications prior to visit.    Review of Systems Except see HPI   {Insert previous labs (optional):23779} {See past labs  Heme  Chem  Endocrine  Serology  Results Review (optional):1}   Objective    BP 126/84 (BP Location: Right Arm, Patient Position: Sitting, Cuff Size: Normal)   Pulse 83   Temp 98.1 F (36.7 C)   Resp 20   Ht  5\' 5"  (1.651 m)   LMP 04/16/2023 (Approximate) Comment: 3-4 days long  SpO2 98%   BMI 32.45 kg/m  {Insert last BP/Wt (optional):23777}{See vitals history (optional):1}   Physical Exam   No results found for any visits on 05/07/23.  Assessment & Plan    *** Assessment and Plan              No follow-ups on file.      [facility]

## 2023-05-08 ENCOUNTER — Other Ambulatory Visit: Payer: Self-pay | Admitting: Obstetrics and Gynecology

## 2023-05-08 ENCOUNTER — Encounter: Payer: Self-pay | Admitting: Physician Assistant

## 2023-05-08 DIAGNOSIS — F0781 Postconcussional syndrome: Secondary | ICD-10-CM | POA: Insufficient documentation

## 2023-05-08 MED ORDER — BUSPIRONE HCL 5 MG PO TABS
5.0000 mg | ORAL_TABLET | Freq: Two times a day (BID) | ORAL | 0 refills | Status: DC
Start: 2023-05-08 — End: 2023-05-13

## 2023-05-08 NOTE — Patient Outreach (Signed)
Care Coordination  05/08/2023  VIBHA FERDIG 02-11-1986 [id number]  RNCM called patient at scheduled time, no answer, left message.  Patient called back and left a message.  RNCM called patient back, no answer and left another message.  RNCM will follow up.  Kathi Der RN, BSN [place]  [employer] - Managed Medicaid High Risk [phone]

## 2023-05-09 ENCOUNTER — Encounter: Payer: Self-pay | Admitting: Physician Assistant

## 2023-05-10 ENCOUNTER — Telehealth: Payer: Self-pay

## 2023-05-10 ENCOUNTER — Telehealth: Payer: Self-pay | Admitting: Plastic Surgery

## 2023-05-10 NOTE — Progress Notes (Unsigned)
Subjective:   I, Philbert Riser, PhD, LAT, ATC acting as a scribe for Clementeen Graham, MD.  Chief Complaint: Deborah Lambert,  is a 37 y.o. female who presents for f/u concussion w/ LOC. She was putting together a new bed frame when the headboard came off and struck her on the top of her head. Pt was last seen by Dr. Denyse Amass on 04/29/23 and was prescribed nortriptyline and was referred to PT and vestibular therapy, but her 1st visit isn't until Nov 14th.  Today, pt reports over the last 4-5 days she experienced nausea and vomiting, emotional, irritable, and tearful. She notes cont'd pain at the base of her skull. She c/o tiredness and just wanting to sleep. Over the last 6-days, she c/o a metallic or blood-like taste in her mouth.   Dx imaging: 04/10/23 Heat CT   Injury date: 04/08/23 Visit #: 2  History of Present Illness:   Concussion Self-Reported Symptom Score Symptoms rated on a scale 1-6, in last 24 hours  Headache: 5   Pressure in head: 5 Neck pain: 5 Nausea or vomiting: 6 Dizziness: 5  Blurred vision: 4  Balance problems: 4 Sensitivity to light:  6 Sensitivity to noise: 5 Feeling slowed down: 5 Feeling like "in a fog": 3 "Don't feel right": 6 Difficulty concentrating: 5 Difficulty remembering: 5 Fatigue or low energy: 5 Confusion: 5 Drowsiness: 5 More emotional: 5 Irritability: 5 Sadness: 5 Nervous or anxious: 5 Trouble falling asleep: 0   Total # of Symptoms: 21/22 Total Symptom Score: 104/132  Previous Total # of Symptoms: 21/22 Previous Symptom Score: 99/132  Tinnitus: Yes- bilat  Review of Systems: No fevers or chills   Review of History: Depression and anxiety.  Previously took fluoxetine and Xanax.  Objective:    Physical Examination Vitals:   05/13/23 1035  BP: 114/82  Pulse: 69  SpO2: 98%   MSK: Decreased cervical motion. Neuro: Alert and oriented normal coordination Psych: Flat affect.  No SI or HI.   Assessment and Plan   37 y.o.  female with concussion.  Symptoms in multiple domains.  Headache: Not controlled with nortriptyline.  Starting immediate release Topamax 25 mg twice daily.  Mood: Not well-controlled.  She has had challenges with mood disorders in the past depression anxiety and PTSD.  Will start fluoxetine.  She tolerated it well in the past.  Started 10 mg daily now and increase to 20 mg in 1 week.  Reassess in 2 weeks. Anxiety is not controlled.  She is not tolerating buspirone well.  Will switch to Klonopin for temporary better control of anxiety.  Vestibular: Not controlled.  She is requiring meclizine intermittently which even that does not work very well.  She is starting vestibular physical therapy on the 14th which I think will be helpful.  Neck pain: This should help with PT.  She may have some occipital neuralgia contributing to headache as well.  Unable to return to work.  Recheck in 2 weeks.      Action/Discussion: Reviewed diagnosis, management options, expected outcomes, and the reasons for scheduled and emergent follow-up. Questions were adequately answered. Patient expressed verbal understanding and agreement with the following plan.     Patient Education: Reviewed with patient the risks (i.e, a repeat concussion, post-concussion syndrome, second-impact syndrome) of returning to play prior to complete resolution, and thoroughly reviewed the signs and symptoms of concussion.Reviewed need for complete resolution of all symptoms, with rest AND exertion, prior to return to play. Reviewed red flags  for urgent medical evaluation: worsening symptoms, nausea/vomiting, intractable headache, musculoskeletal changes, focal neurological deficits. [facility]'s Concussion Care Plan, which clearly outlines the plans stated above, was given to patient.   Level of service: Total encounter time 30 minutes including face-to-face time with the patient and, reviewing past medical record, and  charting on the date of service.   .        After Visit Summary printed out and provided to patient as appropriate.  The above documentation has been reviewed and is accurate and complete Clementeen Graham

## 2023-05-10 NOTE — Telephone Encounter (Signed)
spoke with Pt 05-10-23, provider in sx 05-24-23, pt agreed to come in 11-23 Sat

## 2023-05-10 NOTE — Telephone Encounter (Signed)
Transition Care Management Follow-up Telephone Call Date of discharge and from where: 04/10/2023 [facility] How have you been since you were released from the hospital? Patient stated she is still experiencing headaches, nausea and her balance is off. Any questions or concerns? No  Items Reviewed: Did the pt receive and understand the discharge instructions provided? Yes  Medications obtained and verified? Yes  Other? No  Any new allergies since your discharge? No  Dietary orders reviewed? Yes Do you have support at home? Yes   Follow up appointments reviewed:  PCP Hospital f/u appt confirmed? Yes  Scheduled to see Debera Lat, PA-C on 04/17/2023 @  [facility] f/u appt confirmed? Yes  Scheduled to see [name], MD on 04/29/2023 @ [facility]. Are transportation arrangements needed? No  If their condition worsens, is the pt aware to call PCP or go to the Emergency Dept.? Yes Was the patient provided with contact information for the PCP's office or ED? Yes Was to pt encouraged to call back with questions or concerns? Yes   Shaunta Oncale [facility]  [facility] Guide Direct Dial: [phone]  Website: [email or web address]

## 2023-05-13 ENCOUNTER — Ambulatory Visit (INDEPENDENT_AMBULATORY_CARE_PROVIDER_SITE_OTHER): Payer: 59 | Admitting: Family Medicine

## 2023-05-13 VITALS — BP 114/82 | HR 69 | Ht 65.0 in

## 2023-05-13 DIAGNOSIS — F430 Acute stress reaction: Secondary | ICD-10-CM | POA: Insufficient documentation

## 2023-05-13 DIAGNOSIS — M542 Cervicalgia: Secondary | ICD-10-CM

## 2023-05-13 DIAGNOSIS — F339 Major depressive disorder, recurrent, unspecified: Secondary | ICD-10-CM | POA: Diagnosis not present

## 2023-05-13 DIAGNOSIS — R42 Dizziness and giddiness: Secondary | ICD-10-CM | POA: Diagnosis not present

## 2023-05-13 DIAGNOSIS — S060X9D Concussion with loss of consciousness of unspecified duration, subsequent encounter: Secondary | ICD-10-CM | POA: Diagnosis not present

## 2023-05-13 DIAGNOSIS — G43719 Chronic migraine without aura, intractable, without status migrainosus: Secondary | ICD-10-CM

## 2023-05-13 MED ORDER — TOPIRAMATE 25 MG PO TABS: 25.0000 mg | ORAL_TABLET | Freq: Two times a day (BID) | ORAL | 1 refills | Status: DC

## 2023-05-13 MED ORDER — CLONAZEPAM 0.5 MG PO TABS: 0.5000 mg | ORAL_TABLET | Freq: Two times a day (BID) | ORAL | 1 refills | Status: DC

## 2023-05-13 MED ORDER — FLUOXETINE HCL 10 MG PO CAPS: ORAL_CAPSULE | ORAL | 0 refills | Status: DC

## 2023-05-13 NOTE — Patient Instructions (Addendum)
Thank you for coming in today.   STOP nortryptline and busparone.   START topiramate twice daily to prevent headache.   START fluoxetine 1 pill daily for 1 week then increase to 2 pills daily.  This should help with your mood.    START Clonazepam twice daily for anxiety and irritability.   Recheck in about 2 weeks.  Attend PT.

## 2023-05-14 ENCOUNTER — Other Ambulatory Visit: Payer: Self-pay | Admitting: Obstetrics and Gynecology

## 2023-05-14 NOTE — Patient Instructions (Signed)
Hi Deborah Lambert, thank you for the updates, I hope you feel better-have a nice day!  Deborah Lambert was given information about Medicaid Managed Care team care coordination services as a part of their [facility] Medicaid benefit. Deborah Lambert verbally consented to engagement with the [facility] Managed Care team.   If you are experiencing a medical emergency, please call 911 or report to your local emergency department or urgent care.   If you have a non-emergency medical problem during routine business hours, please contact your provider's office and ask to speak with a nurse.   For questions related to your [facility], please call: [phone] or visit the homepage here: [email or web address]  If you would like to schedule transportation through your [facility], please call the following number at least 2 days in advance of your appointment: [phone]   Rides for urgent appointments can also be made after hours by calling Member Services.  Call the Behavioral Health Crisis Line at [phone], at any time, 24 hours a day, 7 days a week. If you are in danger or need immediate medical attention call 911.  If you would like help to quit smoking, call 1-800-QUIT-NOW (814-581-9739) OR Espaol: 1-855-Djelo-Ya (6-063-016-0109) o para ms informacin haga clic aqu or Text READY to 323-557 to register via text  Deborah Lambert - following are the goals we discussed in your visit today:   Goals Addressed    Timeframe:  Long-Range Goal Priority:  High Start Date:  02/11/23                           Expected End Date:                       Follow Up Date 06/13/23   - prevent colds and flu by washing hands, covering coughs and sneezes, getting enough rest - schedule and keep appointment for annual check-up    Why is this important?   Screening tests can find problems   The doctor or nurse will talk with you about which tests are important.  05/13/13:  Patient to start PT, recent appts with PCP  Patient verbalizes understanding of instructions and care plan provided today and agrees to view in MyChart. Active MyChart status and patient understanding of how to access instructions and care plan via MyChart confirmed with patient.     The Managed Medicaid care management team will reach out to the patient again over the next 30 business  days.  The  Patient  has been provided with contact information for the Managed Medicaid care management team and has been advised to call with any health related questions or concerns.   Kathi Der RN, BSN [place]  [facility] Care Management Coordinator - Managed Medicaid High Risk [phone]   Following is a copy of your plan of care:  Care Plan : RN Care Manager Plan of Care  Updates made by Danie Chandler, RN since 05/14/2023 12:00 AM     Problem: Health Promotion or Disease Self-Management (General Plan of Care)      Long-Range Goal: Chronic Disease Management and Care Coordination Needs   Start Date: 02/11/2023  Expected End Date: 08/14/2023  Priority: High  Note:   Current Barriers:  Knowledge Deficits related to plan of care for management of Asthma, eczema, obesity, anxiety, PTSD, depression, allergies Care Coordination needs related to  Psychologist/Psychiatrist referral Chronic Disease Management support and education needs related to Asthma, eczema, obesity, anxiety, PTSD, depression, allergies 05/14/23:  Patient with concussion following hitting head on headboard of bed during a move-followed by PCP and starting PT.  States now has therapist/counselor.    RNCM Clinical Goal(s):  Patient will verbalize understanding of plan for management of Asthma, eczema, obesity, anxiety, PTSD, depression, allergies as evidenced by patient report verbalize basic understanding of Asthma, eczema,  obesity, anxiety, PTSD, depression, allergies disease process and self health management plan as evidenced by patient report take all medications exactly as prescribed and will call provider for medication related questions as evidenced by patient report demonstrate understanding of rationale for each prescribed medication as evidenced by patient report attend all scheduled medical appointments  as evidenced by patient report and EMR review. Demonstrate ongoing  adherence to prescribed treatment plan for Asthma, eczema, obesity, anxiety, PTSD, depression, allergies  as evidenced by patient report and EMR review. continue to work with RN Care Manager to address care management and care coordination needs related to Asthma, eczema, obesity, anxiety, PTSD, depression, allergies as evidenced by adherence to CM Team Scheduled appointments work with Child psychotherapist to address  related to the management of anxiety, depression, PTSD, Bipolar related to the management of Asthma, eczema, obesity, anxiety, PTSD, depression, allergies as evidenced by review of EMR and patient or social worker report through collaboration with Medical illustrator, provider, and care team.   Interventions: Inter-disciplinary care team collaboration (see longitudinal plan of care) Evaluation of current treatment plan related to  self management and patient's adherence to plan as established by provider Collaborated with LCSW LCSW referral for anxiety, depression, PTSD, BP 05/14/23:  Patient to f/u on NEURO and EYE referrals.   Asthma: (Status:New goal.) Long Term Goal Discussed the importance of adequate rest and management of fatigue with Asthma Assessed social determinant of health barriers   Patient Goals/Self-Care Activities: Take all medications as prescribed Attend all scheduled provider appointments Call pharmacy for medication refills 3-7 days in advance of running out of medications Perform all self care activities  independently  Perform IADL's (shopping, preparing meals, housekeeping, managing finances) independently Call provider office for new concerns or questions  Work with the social worker to address care coordination needs and will continue to work with the clinical team to address health care and disease management related needs 02/11/23:  Patient to contact [facility] for dental providers in her area-declined BSW referral  Follow Up Plan:  The patient has been provided with contact information for the care management team and has been advised to call with any health related questions or concerns.  The care management team will reach out to the patient again over the next 30 business  days.

## 2023-05-14 NOTE — Patient Outreach (Unsigned)
Medicaid Managed Care   Nurse Care Manager Note  05/14/2023 Name:  Deborah Lambert MRN:  [id number] DOB:  May 12, 1986  Deborah Lambert is an 37 y.o. year old female who is a primary patient of Deborah Lambert, [place].  The [facility] Managed Care Coordination team was consulted for assistance with:    Chronic healthcare management needs, asthma, allergies, eczema/dermatitis, anxiety/depression/PTSD/BP  [name] was given information about Medicaid Managed Care Coordination team services today. Deborah Lambert Patient agreed to services and verbal consent obtained.  Engaged with patient by telephone for follow up visit in response to provider referral for case management and/or care coordination services.   Assessments/Interventions:  Review of past medical history, allergies, medications, health status, including review of consultants reports, laboratory and other test data, was performed as part of comprehensive evaluation and provision of chronic care management services.  SDOH (Social Determinants of Health) assessments and interventions performed: SDOH Interventions    Flowsheet Row Patient Outreach Telephone from 05/14/2023 in [place] POPULATION HEALTH DEPARTMENT Patient Outreach Telephone from 04/05/2023 in [place] POPULATION HEALTH DEPARTMENT Patient Outreach Telephone from 03/21/2023 in [place] POPULATION HEALTH DEPARTMENT Patient Outreach Telephone from 03/14/2023 in [place] POPULATION HEALTH DEPARTMENT Patient Outreach Telephone from 02/19/2023 in [place] POPULATION HEALTH DEPARTMENT Patient Outreach Telephone from 02/11/2023 in [place] POPULATION HEALTH DEPARTMENT  SDOH Interventions        Food Insecurity Interventions -- -- -- Intervention Not Indicated -- --  Housing Interventions -- -- -- Intervention Not Indicated -- --  Utilities Interventions -- -- -- -- -- Intervention Not Indicated  Alcohol Usage Interventions Intervention Not Indicated (Score <7)  -- -- -- -- --  Stress Interventions -- [employer], Provide Counseling  [Is in the process of preparing for relocation] [employer], Museum/gallery conservator from being sick] -- Offered [employer], Provide Counseling --  Health Literacy Interventions Intervention Not Indicated -- -- -- -- Intervention Not Indicated     Care Plan Allergies  Allergen Reactions   Aspirin Shortness Of Breath and Rash   Codeine Shortness Of Breath and Rash   Aspirin Hives   Codeine Hives   Gold Sodium Thiomalate    Almond Oil Rash   Coconut (Cocos Nucifera) Rash    Medications Reviewed Today     Reviewed by Deborah Chandler, RN (Registered Nurse) on 05/14/23 at 1021  Med List Status: <None>   Medication Order Taking? Sig Documenting Provider Last Dose Status Informant  albuterol (VENTOLIN HFA) 108 (90 Base) MCG/ACT inhaler [id number] No Inhale 2 puffs into the lungs every 6 (six) hours as needed for wheezing or shortness of breath. Inhale into the lungs every 6 (six) hours as needed for wheezing or shortness of breath. Deborah Lat, PA-C Taking Active   clonazePAM (KLONOPIN) 0.5 MG tablet [id number]  Take 1 tablet (0.5 mg total) by mouth 2 (two) times daily. [name], MD  Active   fluocinonide cream (LIDEX) 0.05 % [id number] No Apply 1 Application topically 2 (two) times daily. Deborah Lat, PA-C Taking Active   FLUoxetine (PROZAC) 10 MG capsule [id number]  Take 1 capsule (10 mg total) by mouth daily for 7 days, THEN 2 capsules (20 mg total) daily for 28 days. [name], MD  Active   meclizine (ANTIVERT) 25 MG tablet [id number] No Take 1 tablet (25 mg total) by mouth 3 (three) times daily as needed for dizziness or nausea. [name], MD Taking  Active   Multiple Vitamin (MULTIVITAMIN) tablet [id number] No Take 1 tablet by mouth daily. [provider] Taking Active Self  omeprazole (PRILOSEC) 20 MG capsule [id number] No  Take 1 capsule (20 mg total) by mouth daily.  Taking Expired 04/25/23 2359   ondansetron (ZOFRAN-ODT) 4 MG disintegrating tablet [id number] No Take 1 tablet (4 mg total) by mouth every 8 (eight) hours as needed. Delton Prairie, MD Taking Active   topiramate (TOPAMAX) 25 MG tablet [id number]  Take 1 tablet (25 mg total) by mouth 2 (two) times daily. To prevent headache [name], MD  Active   triamcinolone ointment (KENALOG) 0.5 % [id number] No Apply 1 Application topically 2 (two) times daily. Tommie Sams, DO Taking Active            Patient Active Problem List   Diagnosis Date Noted   Acute stress reaction 05/13/2023   Postconcussion syndrome 05/08/2023   At high risk for breast cancer 04/26/2023   Family history of breast cancer gene mutation in first degree relative 04/26/2023   Genetic testing 04/16/2023   Generalized edema 11/12/2022   Dizziness 11/12/2022   Encounter for screening for HIV 11/12/2022   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/12/2022   Encounter for hepatitis C screening test for low risk patient 11/12/2022   Obesity (BMI 30-39.9) 10/31/2022   Anxiety 10/31/2022   Uncomplicated asthma 10/31/2022   Eczema of both hands 10/31/2022   PTSD (post-traumatic stress disorder) 10/31/2022   Encounter for medical examination to establish care 10/31/2022   Seasonal allergies 10/31/2022   Depression, recurrent (HCC) 10/31/2022   Conditions to be addressed/monitored per PCP order:  Chronic healthcare management needs, asthma, allergies, eczema/dermatitis, anxiety/depression/PTSD/BP  Care Plan : RN Care Manager Plan of Care  Updates made by Deborah Chandler, RN since 05/14/2023 12:00 AM     Problem: Health Promotion or Disease Self-Management (General Plan of Care)      Long-Range Goal: Chronic Disease Management and Care Coordination Needs   Start Date: 02/11/2023  Expected End Date: 08/14/2023  Priority: High  Note:   Current Barriers:  Knowledge Deficits  related to plan of care for management of Asthma, eczema, obesity, anxiety, PTSD, depression, allergies Care Coordination needs related to Psychologist/Psychiatrist referral Chronic Disease Management support and education needs related to Asthma, eczema, obesity, anxiety, PTSD, depression, allergies 05/14/23:  Patient with concussion following hitting head on headboard of bed during a move-followed by PCP and starting PT.  States now has therapist/counselor.    RNCM Clinical Goal(s):  Patient will verbalize understanding of plan for management of Asthma, eczema, obesity, anxiety, PTSD, depression, allergies as evidenced by patient report verbalize basic understanding of Asthma, eczema, obesity, anxiety, PTSD, depression, allergies disease process and self health management plan as evidenced by patient report take all medications exactly as prescribed and will call provider for medication related questions as evidenced by patient report demonstrate understanding of rationale for each prescribed medication as evidenced by patient report attend all scheduled medical appointments  as evidenced by patient report and EMR review. Demonstrate ongoing  adherence to prescribed treatment plan for Asthma, eczema, obesity, anxiety, PTSD, depression, allergies  as evidenced by patient report and EMR review. continue to work with RN Care Manager to address care management and care coordination needs related to Asthma, eczema, obesity, anxiety, PTSD, depression, allergies as evidenced by adherence to CM Team Scheduled appointments work with Child psychotherapist to address  related to the management of anxiety, depression, PTSD, Bipolar related to  the management of Asthma, eczema, obesity, anxiety, PTSD, depression, allergies as evidenced by review of EMR and patient or social worker report through collaboration with Medical illustrator, provider, and care team.   Interventions: Inter-disciplinary care team collaboration (see  longitudinal plan of care) Evaluation of current treatment plan related to  self management and patient's adherence to plan as established by provider Collaborated with LCSW LCSW referral for anxiety, depression, PTSD, BP 05/14/23:  Patient to f/u on NEURO and EYE referrals.   Asthma: (Status:New goal.) Long Term Goal Discussed the importance of adequate rest and management of fatigue with Asthma Assessed social determinant of health barriers   Patient Goals/Self-Care Activities: Take all medications as prescribed Attend all scheduled provider appointments Call pharmacy for medication refills 3-7 days in advance of running out of medications Perform all self care activities independently  Perform IADL's (shopping, preparing meals, housekeeping, managing finances) independently Call provider office for new concerns or questions  Work with the social worker to address care coordination needs and will continue to work with the clinical team to address health care and disease management related needs 02/11/23:  Patient to contact [facility] for dental providers in her area-declined BSW referral  Follow Up Plan:  The patient has been provided with contact information for the care management team and has been advised to call with any health related questions or concerns.  The care management team will reach out to the patient again over the next 30 business  days.     Long-Range Goal: Establish Plan of Care for Chronic Disease Management and Care Coordination Needs   Priority: High  Note:   Timeframe:  Long-Range Goal Priority:  High Start Date:  02/11/23                           Expected End Date:                       Follow Up Date 06/13/23   - prevent colds and flu by washing hands, covering coughs and sneezes, getting enough rest - schedule and keep appointment for annual check-up    Why is this important?   Screening tests can find problems  The doctor or nurse will talk with you  about which tests are important.  05/13/13:  Patient to start PT, recent appts with PCP     Follow Up:  {FOLLOWUP:24991}  Plan: {MANAGED MEDICAID FOLLOW UP WJXB:14782}  Date/time of next scheduled RN care management/care coordination outreach:  ***  ***

## 2023-05-16 ENCOUNTER — Ambulatory Visit: Payer: 59 | Attending: Family Medicine | Admitting: Physical Therapy

## 2023-05-16 DIAGNOSIS — M542 Cervicalgia: Secondary | ICD-10-CM | POA: Diagnosis not present

## 2023-05-16 DIAGNOSIS — R2681 Unsteadiness on feet: Secondary | ICD-10-CM | POA: Insufficient documentation

## 2023-05-16 DIAGNOSIS — S060X9A Concussion with loss of consciousness of unspecified duration, initial encounter: Secondary | ICD-10-CM | POA: Diagnosis not present

## 2023-05-16 DIAGNOSIS — S060X9D Concussion with loss of consciousness of unspecified duration, subsequent encounter: Secondary | ICD-10-CM | POA: Diagnosis not present

## 2023-05-16 DIAGNOSIS — R42 Dizziness and giddiness: Secondary | ICD-10-CM | POA: Insufficient documentation

## 2023-05-16 NOTE — Therapy (Signed)
OUTPATIENT PHYSICAL THERAPY EVALUATION   Patient Name: Deborah Lambert MRN: [id number] DOB:March 01, 1986, 37 y.o., female Today's Date: 05/16/2023   PCP: Debera Lat, PA-C REFERRING PROVIDER: [name], MD  END OF SESSION:  PT End of Session - 05/16/23 1652     Visit Number 1    Number of Visits 24    Date for PT Re-Evaluation 06/27/23    Progress Note Due on Visit 10    PT Start Time 1401    PT Stop Time 1444    PT Time Calculation (min) 43 min    Activity Tolerance Patient limited by pain    Behavior During Therapy Anxious;Restless             Past Medical History:  Diagnosis Date   Anxiety    Bell's palsy    Cluster headaches    Focal seizures (HCC)    PTSD (post-traumatic stress disorder)    Seizures (HCC)    Past Surgical History:  Procedure Laterality Date   right arm surgery     Patient Active Problem List   Diagnosis Date Noted   Acute stress reaction 05/13/2023   Postconcussion syndrome 05/08/2023   At high risk for breast cancer 04/26/2023   Family history of breast cancer gene mutation in first degree relative 04/26/2023   Genetic testing 04/16/2023   Generalized edema 11/12/2022   Dizziness 11/12/2022   Encounter for screening for HIV 11/12/2022   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/12/2022   Encounter for hepatitis C screening test for low risk patient 11/12/2022   Obesity (BMI 30-39.9) 10/31/2022   Anxiety 10/31/2022   Uncomplicated asthma 10/31/2022   Eczema of both hands 10/31/2022   PTSD (post-traumatic stress disorder) 10/31/2022   Encounter for medical examination to establish care 10/31/2022   Seasonal allergies 10/31/2022   Depression, recurrent (HCC) 10/31/2022    ONSET DATE: 04/08/2023  REFERRING DIAG:  Q46.0X9A (ICD-10-CM) - Concussion with loss of consciousness, initial encounter  M54.2 (ICD-10-CM) - Neck pain    THERAPY DIAG:  Concussion with loss of consciousness, subsequent encounter  Neck  pain  Dizziness and giddiness  Rationale for Evaluation and Treatment: Rehabilitation  SUBJECTIVE:                                                                                                                                                                                             SUBJECTIVE STATEMENT: Pt stated that concussion occurred by bedframe falling on top of her head while putting together a bedframe. Pt stated that she went to the hospital 3 days later. Pt recalled that she got a concussion  when she was a kid when a ceiling fan fell on her head. Pt stated that concussion symptoms include metallic taste in mouth, sensitivity to lights, dizziness (loss of balance where she has to catch herself when walking a lot) and feeling of posterior head floating and tingling. Pt stated that she is getting 4-4.5 hours of sleep ever since onset. Pt stated that she went to MD earlier this week and was told symptoms were getting worse. Pt reported 8/10 on NPS in posterior skull and cervical spine. Pt stated that she has to curl up in a ball to be comfortable. Pt stated that medications keeps nausea and headches minimal. Pt takes omeprazole (40 mg), topiramate (25 mg), clonazepam (0.5 mg), and fluoxetine (10 mg) twice a day. 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 Pt lives with husband who works as a Naval architect.  Pt accompanied by: self  PERTINENT HISTORY: Uncomplicated asthma, postconcussion syndrome, eczema, supraspinatus syndrome of R shoulder, obesity, anxiety, PTSD, depression (recurrent), generalized edema, dizziness, acute stress reaction   PAIN:  Are you having pain?   PRECAUTIONS: N/A  RED FLAGS: None   WEIGHT BEARING RESTRICTIONS: No  FALLS: Has patient fallen in last 6 months?  Recalls occasional stumbles due to symptoms  LIVING ENVIRONMENT: Lives with: lives with their spouse Lives in: House/apartment Stairs: Yes: External: 7 steps; handrails:  bilateral Has following equipment at home:  N/A  PLOF: Independent  PATIENT GOALS: quit having shooting pains and dizziness symptoms  OBJECTIVE:  Note: Objective measures were completed at Evaluation unless otherwise noted.  DIAGNOSTIC FINDINGS: Head CT on 04/10/2023: No acute intracranial abnormality.   COGNITION: Overall cognitive status: Within functional limits for tasks assessed   SENSATION: Not tested  COORDINATION: Not tested  EDEMA:  Not measured  MUSCLE TONE:Not measured  MUSCLE LENGTH: Not tested  DTRs:  Not tested  POSTURE: rounded shoulders and forward head   PALPATION/CPA TESTING/ JOINT MOBILITY TESTING: CPA Testing:  C1-C7: Hypomobile   CERVICAL ROM:    Active ROM A/PROM (deg) eval  Flexion 10*  Extension 25*  Right lateral flexion 22*  Left lateral flexion 25*  Right rotation 18*  Left rotation 23*   (Blank rows = not tested) *indicates pain with test/ movement    UPPER EXTREMITY MMT: TEST VISIT 2 or when pt s/s tolerate   MMT Right eval Left eval  Shoulder flexion    Shoulder extension    Shoulder abduction    Shoulder adduction    Shoulder extension    Shoulder internal rotation    Shoulder external rotation    Middle trapezius    Lower trapezius    Elbow flexion    Elbow extension    Wrist flexion    Wrist extension    Wrist ulnar deviation    Wrist radial deviation    Wrist pronation    Wrist supination    Grip strength     (Blank rows = not tested)  CERVICAL SPECIAL TESTS:  Upper limb tension test (ULTT): Positive on R side   FUNCTIONAL TESTS:  N/A  PATIENT SURVEYS:  DHI 48 NDI 31 FOTO 46   TODAY'S TREATMENT:  DATE: 05/16/2023   Eval only  EDUCATION Education details: POC Person educated: Patient Education method: Explanation Education comprehension: verbalized  understanding   HOME EXERCISE PROGRAM: Establish visit 2   GOALS: Goals reviewed with patient? Yes   SHORT TERM GOALS: Target date: 06/20/2023    Patient will be independent in home exercise program to improve strength/mobility for better functional independence with ADLs. Baseline: No HEP currently  Goal status: INITIAL  LONG TERM GOALS: Target date: 08/08/2023   1.  Patient will achieve cervical ROM WFL and without pain in order to demonstrate independence with ADLs and be able to return to work.   Baseline:  See eval objective  Goal status: INITIAL  2.  Patient will increase FOTO score to equal to or greater than 64 to demonstrate statistically significant improvement in mobility and quality of life.  Baseline: 46 Goal status: INITIAL   3.  Patient will increase NDI score by > 10 points to demonstrate increased function during ADLs.  Baseline: 31 Goal status: INITIAL   4.  Patient will increase DHI score by >18 points to demonstrate improved perceived disability in vestibular symptoms, so that QOL can improve.  Baseline: 48 Goal status: INITIAL    ASSESSMENT:  CLINICAL IMPRESSION:  Patient is a 37 y.o. female who was seen today for physical therapy evaluation and treatment for post concussion, cervical pain, and vestibular symptoms. Pt was very apprehensive and reported pain with minimal movements of her cervical spine. CPAs of the cervical spine were performed and pt reported numbness and tingling symptoms with these. An ULTT was performed after the pt reported symptoms of numbness and tingling and the ULTT was positive on the pt's R side. Pt was educated that she was demonstrating signs of cervical radiculopathy as a potential cause of cervical pain. After the session was completed, pt demonstrated what she described as a "dizziness episode" for approximately 5 minutes while putting her sweatshirt back on putting neck in a left side bending and rotation posture. Pt  remained still until symptoms subsided. Future visits will address cervical pain, as well as concussion symptoms. Pt will continue to benefit from skilled physical therapy intervention to address impairments, improve QOL, and attain therapy goals.    OBJECTIVE IMPAIRMENTS: decreased activity tolerance, decreased ROM, dizziness, hypomobility, and pain.   ACTIVITY LIMITATIONS: lifting, bending, standing, squatting, sleeping, and stairs  PARTICIPATION LIMITATIONS: driving and occupation  PERSONAL FACTORS: Time since onset of injury/illness/exacerbation are also affecting patient's functional outcome.   REHAB POTENTIAL: Good  CLINICAL DECISION MAKING: Evolving/moderate complexity  EVALUATION COMPLEXITY: Moderate  PLAN:  PT FREQUENCY: 2x/week  PT DURATION: 12 weeks  PLANNED INTERVENTIONS: 97164- PT Re-evaluation, 97110-Therapeutic exercises, 97530- Therapeutic activity, 97112- Neuromuscular re-education, 97535- Self Care, 16109- Manual therapy, 95992- Canalith repositioning, Dry Needling, Joint mobilization, Vestibular training, Cryotherapy, and Moist heat  PLAN FOR NEXT SESSION: Assess concussion/vestibular symptoms, cervical spine mobilization as tolerated, pain management, therex to improve cervical ROM and pain    Debara Pickett, Student-PT 05/16/2023, 4:56 PM   This entire session was performed under direct supervision and direction of a licensed therapist/therapist assistant . I have personally read, edited and approve of the note as written.    This licensed clinician was present and actively directing care throughout the session at all times.  Norman Herrlich PT ,DPT Physical Therapist- [place]  [facility]

## 2023-05-17 ENCOUNTER — Encounter: Payer: Self-pay | Admitting: Physical Therapy

## 2023-05-17 ENCOUNTER — Ambulatory Visit: Payer: 59 | Admitting: Physician Assistant

## 2023-05-21 ENCOUNTER — Ambulatory Visit: Payer: 59

## 2023-05-21 ENCOUNTER — Telehealth: Payer: Self-pay

## 2023-05-21 NOTE — Telephone Encounter (Signed)
PT attempted to reach pt via secure phone line due to missed visit/no-show today. PT left VM with information regarding next appointment date/time.   Temple Pacini PT, DPT

## 2023-05-23 ENCOUNTER — Encounter: Payer: Self-pay | Admitting: Nurse Practitioner

## 2023-05-23 ENCOUNTER — Inpatient Hospital Stay: Payer: 59 | Attending: Internal Medicine | Admitting: Nurse Practitioner

## 2023-05-23 VITALS — BP 107/82 | HR 88 | Temp 96.4°F | Wt 204.0 lb

## 2023-05-23 DIAGNOSIS — N921 Excessive and frequent menstruation with irregular cycle: Secondary | ICD-10-CM | POA: Diagnosis not present

## 2023-05-23 DIAGNOSIS — Z803 Family history of malignant neoplasm of breast: Secondary | ICD-10-CM | POA: Insufficient documentation

## 2023-05-23 DIAGNOSIS — Z808 Family history of malignant neoplasm of other organs or systems: Secondary | ICD-10-CM | POA: Insufficient documentation

## 2023-05-23 DIAGNOSIS — Z9189 Other specified personal risk factors, not elsewhere classified: Secondary | ICD-10-CM | POA: Diagnosis not present

## 2023-05-23 DIAGNOSIS — Z801 Family history of malignant neoplasm of trachea, bronchus and lung: Secondary | ICD-10-CM | POA: Diagnosis not present

## 2023-05-23 DIAGNOSIS — L989 Disorder of the skin and subcutaneous tissue, unspecified: Secondary | ICD-10-CM | POA: Diagnosis not present
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
## 2023-05-24 ENCOUNTER — Institutional Professional Consult (permissible substitution): Payer: Medicaid Other | Admitting: Plastic Surgery

## 2023-05-24 DIAGNOSIS — L209 Atopic dermatitis, unspecified: Secondary | ICD-10-CM | POA: Insufficient documentation

## 2023-05-24 DIAGNOSIS — J4540 Moderate persistent asthma, uncomplicated: Secondary | ICD-10-CM | POA: Insufficient documentation

## 2023-05-25 ENCOUNTER — Encounter: Payer: Self-pay | Admitting: Plastic Surgery

## 2023-05-25 ENCOUNTER — Ambulatory Visit (INDEPENDENT_AMBULATORY_CARE_PROVIDER_SITE_OTHER): Payer: 59 | Admitting: Plastic Surgery

## 2023-05-25 VITALS — BP 99/70 | HR 85 | Ht 65.0 in | Wt 200.0 lb

## 2023-05-25 DIAGNOSIS — M549 Dorsalgia, unspecified: Secondary | ICD-10-CM

## 2023-05-25 DIAGNOSIS — M545 Low back pain, unspecified: Secondary | ICD-10-CM

## 2023-05-25 DIAGNOSIS — M542 Cervicalgia: Secondary | ICD-10-CM

## 2023-05-25 DIAGNOSIS — N62 Hypertrophy of breast: Secondary | ICD-10-CM

## 2023-05-25 DIAGNOSIS — Z803 Family history of malignant neoplasm of breast: Secondary | ICD-10-CM

## 2023-05-25 DIAGNOSIS — M546 Pain in thoracic spine: Secondary | ICD-10-CM

## 2023-05-25 DIAGNOSIS — Z6833 Body mass index (BMI) 33.0-33.9, adult: Secondary | ICD-10-CM

## 2023-05-25 NOTE — Progress Notes (Signed)
Patient ID: Deborah Lambert, female    DOB: 29-Aug-1985, 37 y.o.   MRN: [id number]   Chief Complaint  Patient presents with   Consult         Mammary Hyperplasia: The patient is a 37 y.o. female with a history of mammary hyperplasia for several years.  She has extremely large breasts causing symptoms that include the following: Back pain in the upper and lower back, including neck pain. She pulls or pins her bra straps to provide better lift and relief of the pressure and pain. She notices relief by holding her breast up manually.  Her shoulder straps cause grooves and pain and pressure that requires padding for relief. Pain medication is sometimes required with motrin and tylenol.  Activities that are hindered by enlarged breasts include: exercise and running.  She has tried supportive clothing as well as fitted bras without improvement.  Her breasts are extremely large and fairly symmetric.  She has hyperpigmentation of the inframammary area on both sides.  The sternal to nipple distance on the right is 33 cm and the left is 32 cm.  The IMF distance is 14 cm.  She is 5 feet 5 inches tall and weighs 200 pounds.  The BMI = 33.3 kg/m.  Preoperative bra size = 38C cup.  Would like to be a B cup. The estimated excess breast tissue to be removed at the time of surgery = 500-600 grams on the left and 500-600 grams on the right.  Mammogram history: negative 10/24.  Family history of breast cancer:  maternal and paternal side.  Tobacco use:  none.   The patient expresses the desire to pursue surgical intervention.     Review of Systems  Constitutional:  Positive for activity change. Negative for appetite change.  Eyes: Negative.   Respiratory: Negative.    Cardiovascular: Negative.   Gastrointestinal: Negative.   Endocrine: Negative.   Musculoskeletal:  Positive for back pain and neck pain.  Skin:  Positive for rash.    Past Medical History:  Diagnosis Date   Anxiety    Bell's palsy     Cluster headaches    Focal seizures (HCC)    PTSD (post-traumatic stress disorder)    Seizures (HCC)     Past Surgical History:  Procedure Laterality Date   right arm surgery        Current Outpatient Medications:    albuterol (VENTOLIN HFA) 108 (90 Base) MCG/ACT inhaler, Inhale 2 puffs into the lungs every 6 (six) hours as needed for wheezing or shortness of breath. Inhale into the lungs every 6 (six) hours as needed for wheezing or shortness of breath., Disp: 18 g, Rfl: 0   BREO ELLIPTA 200-25 MCG/ACT AEPB, 1 puff daily., Disp: , Rfl:    clonazePAM (KLONOPIN) 0.5 MG tablet, Take 1 tablet (0.5 mg total) by mouth 2 (two) times daily., Disp: 60 tablet, Rfl: 1   Crisaborole (EUCRISA) 2 % OINT, 1 application Externally Twice a day for 30 days, Disp: , Rfl:    fluocinonide cream (LIDEX) 0.05 %, Apply 1 Application topically 2 (two) times daily., Disp: 30 g, Rfl: 1   FLUoxetine (PROZAC) 10 MG capsule, Take 1 capsule (10 mg total) by mouth daily for 7 days, THEN 2 capsules (20 mg total) daily for 28 days., Disp: 63 capsule, Rfl: 0   meclizine (ANTIVERT) 25 MG tablet, Take 1 tablet (25 mg total) by mouth 3 (three) times daily as needed for dizziness or nausea.,  Disp: 30 tablet, Rfl: 3   Multiple Vitamin (MULTIVITAMIN) tablet, Take 1 tablet by mouth daily., Disp: , Rfl:    ondansetron (ZOFRAN-ODT) 4 MG disintegrating tablet, Take 1 tablet (4 mg total) by mouth every 8 (eight) hours as needed., Disp: 20 tablet, Rfl: 0   topiramate (TOPAMAX) 25 MG tablet, Take 1 tablet (25 mg total) by mouth 2 (two) times daily. To prevent headache, Disp: 60 tablet, Rfl: 1   triamcinolone ointment (KENALOG) 0.5 %, Apply 1 Application topically 2 (two) times daily., Disp: 30 g, Rfl: 0   omeprazole (PRILOSEC) 20 MG capsule, Take 1 capsule (20 mg total) by mouth daily., Disp: 30 capsule, Rfl: 0   Objective:   Vitals:   05/25/23 0819  BP: 99/70  Pulse: 85  SpO2: 96%    Physical Exam Vitals and nursing note  reviewed.  Constitutional:      Appearance: Normal appearance.  HENT:     Head: Normocephalic and atraumatic.  Cardiovascular:     Rate and Rhythm: Normal rate.     Pulses: Normal pulses.  Pulmonary:     Effort: Pulmonary effort is normal.  Abdominal:     General: There is no distension.     Palpations: Abdomen is soft. There is no mass.  Musculoskeletal:        General: No swelling or deformity.  Skin:    General: Skin is warm.     Capillary Refill: Capillary refill takes less than 2 seconds.     Coloration: Skin is not jaundiced.     Findings: No bruising.  Neurological:     Mental Status: She is alert and oriented to person, place, and time.  Psychiatric:        Mood and Affect: Mood normal.        Behavior: Behavior normal.        Thought Content: Thought content normal.        Judgment: Judgment normal.     Assessment & Plan:  Hypertrophy of breast - Plan: Ambulatory referral to Physical Therapy  Upper back pain - Plan: Ambulatory referral to Physical Therapy  The procedure the patient selected and that was best for the patient was discussed. The risk were discussed and include but not limited to the following:  Breast asymmetry, fluid accumulation, firmness of the breast, inability to breast feed, loss of nipple or areola, skin loss, change in skin and nipple sensation, fat necrosis of the breast tissue, bleeding, infection and healing delay.  There are risks of anesthesia and injury to nerves or blood vessels.  Allergic reaction to tape, suture and skin glue are possible.  There will be swelling.  Any of these can lead to the need for revisional surgery which is not included in this surgery.  A breast reduction has potential to interfere with diagnostic procedures in the future.  This procedure is best done when the breast is fully developed.  Changes in the breast will continue to occur over time: pregnancy, weight gain or weigh loss. No guarantees are given for a certain  bra or breast size.    Total time: 40 minutes. This includes time spent with the patient during the visit as well as time spent before and after the visit reviewing the chart, documenting the encounter, ordering pertinent studies and literature for the patient.   Physical therapy:  ordered Mammogram:  done  Patient is a candidate for bilateral breast reduction with liposuction.  I am a little concerned I cannot get  off as much as insurance is requiring.  This may mean the patient needs to drop some weight to qualify but we will go ahead with physical therapy and see if this helps as well.  We will plan to see the patient back when she finishes with physical therapy.  Pictures were obtained of the patient and placed in the chart with the patient's or guardian's permission.   [name] Harneet Noblett, DO

## 2023-05-27 ENCOUNTER — Encounter: Payer: Self-pay | Admitting: Emergency Medicine

## 2023-05-27 ENCOUNTER — Encounter: Payer: 59 | Admitting: Family Medicine

## 2023-05-27 ENCOUNTER — Emergency Department
Admission: EM | Admit: 2023-05-27 | Discharge: 2023-05-27 | Disposition: A | Discharge status: Home / Self Care | Payer: 59 | Attending: Emergency Medicine | Admitting: Emergency Medicine

## 2023-05-27 ENCOUNTER — Ambulatory Visit: Payer: 59

## 2023-05-27 ENCOUNTER — Other Ambulatory Visit: Payer: Self-pay

## 2023-05-27 DIAGNOSIS — R2681 Unsteadiness on feet: Secondary | ICD-10-CM

## 2023-05-27 DIAGNOSIS — R519 Headache, unspecified: Secondary | ICD-10-CM | POA: Diagnosis not present

## 2023-05-27 DIAGNOSIS — R42 Dizziness and giddiness: Secondary | ICD-10-CM

## 2023-05-27 DIAGNOSIS — R55 Syncope and collapse: Secondary | ICD-10-CM | POA: Diagnosis not present

## 2023-05-27 DIAGNOSIS — S060X9D Concussion with loss of consciousness of unspecified duration, subsequent encounter: Secondary | ICD-10-CM

## 2023-05-27 DIAGNOSIS — M542 Cervicalgia: Secondary | ICD-10-CM

## 2023-05-27 LAB — URINALYSIS, ROUTINE W REFLEX MICROSCOPIC
Bacteria, UA: NONE SEEN
Bilirubin Urine: NEGATIVE
Glucose, UA: NEGATIVE mg/dL
Ketones, ur: NEGATIVE mg/dL
Leukocytes,Ua: NEGATIVE
Nitrite: NEGATIVE
Protein, ur: NEGATIVE mg/dL
RBC / HPF: >50 RBC/hpf (ref 0–5)
Specific Gravity, Urine: 1.023 (ref 1.005–1.030)
pH: 5 (ref 5.0–8.0)

## 2023-05-27 LAB — CBC
HCT: 42.4 % (ref 36.0–46.0)
Hemoglobin: 14.1 g/dL (ref 12.0–15.0)
MCH: 29.5 pg (ref 26.0–34.0)
MCHC: 33.3 g/dL (ref 30.0–36.0)
MCV: 88.7 fL (ref 80.0–100.0)
Platelets: 237 10*3/uL (ref 150–400)
RBC: 4.78 MIL/uL (ref 3.87–5.11)
RDW: 12 % (ref 11.5–15.5)
WBC: 7.5 10*3/uL (ref 4.0–10.5)
nRBC: 0 % (ref 0.0–0.2)

## 2023-05-27 LAB — BASIC METABOLIC PANEL
Anion gap: 7 (ref 5–15)
BUN: 14 mg/dL (ref 6–20)
CO2: 21 mmol/L — ABNORMAL LOW (ref 22–32)
Calcium: 8.7 mg/dL — ABNORMAL LOW (ref 8.9–10.3)
Chloride: 109 mmol/L (ref 98–111)
Creatinine, Ser: 0.8 mg/dL (ref 0.44–1.00)
GFR, Estimated: >60 mL/min (ref >60)
Glucose, Bld: 97 mg/dL (ref 70–99)
Potassium: 3.9 mmol/L (ref 3.5–5.1)
Sodium: 137 mmol/L (ref 135–145)

## 2023-05-27 LAB — CBG MONITORING, ED: Glucose-Capillary: 87 mg/dL (ref 70–99)

## 2023-05-27 LAB — POC URINE PREG, ED: Preg Test, Ur: NEGATIVE

## 2023-05-27 NOTE — ED Notes (Signed)
Pt provided discharge instructions and prescription information. Pt was given the opportunity to ask questions and questions were answered.   

## 2023-05-27 NOTE — Therapy (Signed)
OUTPATIENT PHYSICAL THERAPY TREATMENT   Patient Name: Deborah Lambert MRN: [id number] DOB:Sep 30, 1985, 37 y.o., female Today's Date: 05/27/2023   PCP: Debera Lat, PA-C REFERRING PROVIDER: [name], MD  END OF SESSION:  PT End of Session - 05/27/23 1139     Visit Number 2    Number of Visits 24    Date for PT Re-Evaluation 06/27/23    Progress Note Due on Visit 10    PT Start Time 1108    PT Stop Time 1132    PT Time Calculation (min) 24 min    Activity Tolerance Patient limited by pain    Behavior During Therapy Anxious;Restless;Flat affect   pt confused, reports confusion, has difficulty following cues             Past Medical History:  Diagnosis Date   Anxiety    Bell's palsy    Cluster headaches    Focal seizures (HCC)    PTSD (post-traumatic stress disorder)    Seizures (HCC)    Past Surgical History:  Procedure Laterality Date   right arm surgery     Patient Active Problem List   Diagnosis Date Noted   Atopic dermatitis 05/24/2023   Moderate persistent asthma, uncomplicated 05/24/2023   Acute stress reaction 05/13/2023   Postconcussion syndrome 05/08/2023   At high risk for breast cancer 04/26/2023   Family history of breast cancer gene mutation in first degree relative 04/26/2023   Genetic testing 04/16/2023   Generalized edema 11/12/2022   Dizziness 11/12/2022   Encounter for screening for HIV 11/12/2022   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/12/2022   Encounter for hepatitis C screening test for low risk patient 11/12/2022   Obesity (BMI 30-39.9) 10/31/2022   Anxiety 10/31/2022   Uncomplicated asthma 10/31/2022   Eczema of both hands 10/31/2022   PTSD (post-traumatic stress disorder) 10/31/2022   Encounter for medical examination to establish care 10/31/2022   Seasonal allergies 10/31/2022   Depression, recurrent (HCC) 10/31/2022    ONSET DATE: 04/08/2023  REFERRING DIAG:  W09.0X9A (ICD-10-CM) - Concussion with loss of  consciousness, initial encounter  M54.2 (ICD-10-CM) - Neck pain    THERAPY DIAG:  Concussion with loss of consciousness, subsequent encounter  Neck pain  Dizziness and giddiness  Unsteadiness on feet  Rationale for Evaluation and Treatment: Rehabilitation  SUBJECTIVE:                                                                                                                                                                                             SUBJECTIVE STATEMENT: Pt reports she has been "passing out" a lot.  This started 3-4 days ago and this has never happened before, She says she was in the kitchen and she went straight to the floor. An hour or 30 minutes later she slid to the floor again. She says the other day she fell going up the steps. . She reports she is not getting any pre-symptoms prior to passing out, she just falls down. She doesn't realize she's falling/not aware until on floor. She says she feels when she is going down she is sliding down. She still has neck pain. She is starting to get more symptoms since concussion. Her husband noticed since these episodes her speech isn't normal, she says she slurs. She reports she gets confused, had trouble playing a card game and couldn't "figure out what came after four."  She reports she has had some n/t, not clear if this is since concussion or recent.   PERTINENT HISTORY:    Pt stated that concussion occurred by bedframe falling on top of her head while putting together a bedframe. Pt stated that she went to the hospital 3 days later. Pt recalled that she got a concussion when she was a kid when a ceiling fan fell on her head. Pt stated that concussion symptoms include metallic taste in mouth, sensitivity to lights, dizziness (loss of balance where she has to catch herself when walking a lot) and feeling of posterior head floating and tingling. Pt stated that she is getting 4-4.5 hours of sleep ever since onset. Pt stated  that she went to MD earlier this week and was told symptoms were getting worse. Pt reported 8/10 on NPS in posterior skull and cervical spine. Pt stated that she has to curl up in a ball to be comfortable. Pt stated that medications keeps nausea and headches minimal. Pt takes omeprazole (40 mg), topiramate (25 mg), clonazepam (0.5 mg), and fluoxetine (10 mg) twice a day. 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 Pt lives with husband who works as a [employer] architect.  Pt accompanied by: selfUncomplicated asthma, postconcussion syndrome, eczema, supraspinatus syndrome of R shoulder, obesity, anxiety, PTSD, depression (recurrent), generalized edema, dizziness, acute stress reaction   PAIN:  Are you having pain?   PRECAUTIONS: N/A  RED FLAGS: None   WEIGHT BEARING RESTRICTIONS: No  FALLS: Has patient fallen in last 6 months?  Recalls occasional stumbles due to symptoms  LIVING ENVIRONMENT: Lives with: lives with their spouse Lives in: House/apartment Stairs: Yes: External: 7 steps; handrails: bilateral Has following equipment at home:  N/A  PLOF: Independent  PATIENT GOALS: quit having shooting pains and dizziness symptoms  OBJECTIVE:  Note: Objective measures were completed at Evaluation unless otherwise noted.  DIAGNOSTIC FINDINGS: Head CT on 04/10/2023: No acute intracranial abnormality.   COGNITION: Overall cognitive status: Within functional limits for tasks assessed   SENSATION: Not tested  COORDINATION: Not tested  EDEMA:  Not measured  MUSCLE TONE:Not measured  MUSCLE LENGTH: Not tested  DTRs:  Not tested  POSTURE: rounded shoulders and forward head   PALPATION/CPA TESTING/ JOINT MOBILITY TESTING: CPA Testing:  C1-C7: Hypomobile   CERVICAL ROM:    Active ROM A/PROM (deg) eval  Flexion 10*  Extension 25*  Right lateral flexion 22*  Left lateral flexion 25*  Right rotation 18*  Left rotation 23*   (Blank rows = not  tested) *indicates pain with test/ movement    UPPER EXTREMITY MMT: TEST VISIT 2 or when pt s/s tolerate   MMT  Right eval Left eval  Shoulder flexion    Shoulder extension    Shoulder abduction    Shoulder adduction    Shoulder extension    Shoulder internal rotation    Shoulder external rotation    Middle trapezius    Lower trapezius    Elbow flexion    Elbow extension    Wrist flexion    Wrist extension    Wrist ulnar deviation    Wrist radial deviation    Wrist pronation    Wrist supination    Grip strength     (Blank rows = not tested)  CERVICAL SPECIAL TESTS:  Upper limb tension test (ULTT): Positive on R side   FUNCTIONAL TESTS:  N/A  PATIENT SURVEYS:  DHI 48 NDI 31 FOTO 46   TODAY'S TREATMENT:                                                                                                                              DATE: 05/27/23    TA:   VITALS: taken as pt reports multiple instances of syncope since last visit 108/82 mmHg (MAP 92) HR 75 bpm SpO2%: 100%  Cover/uncover WNL  CONVERGENCE: abnormal, eyes do not converge, pt sees double 6" from face, it is possible pt having trouble following cues/understanding task  Smooth pursuits: abnormal, pt with difficulty following cues to complete assessment  Pt confused during session, having trouble following cues and reports feeling confused. Pt reports onset of severe neck pain during visit, reports onset of vertigo during visit (spontaneous, pt still with onset but no nystagmus observed/pt closes eyes). Pt unsteady when PT + 2 assists pt transferring from standard chair to transport chair. PT calls rapid response team. Pt taken by rapid response team to ED.     EDUCATION Education details: vitals, reason for need to call rapid Person educated: Patient Education method: Explanation Education comprehension: verbalized understanding, pt possibly confused with explanation   HOME EXERCISE  PROGRAM: Establish visit 2 - deferred  GOALS: Goals reviewed with patient? Yes   SHORT TERM GOALS: Target date: 06/20/2023    Patient will be independent in home exercise program to improve strength/mobility for better functional independence with ADLs. Baseline: No HEP currently  Goal status: INITIAL  LONG TERM GOALS: Target date: 08/08/2023   1.  Patient will achieve cervical ROM WFL and without pain in order to demonstrate independence with ADLs and be able to return to work.   Baseline:  See eval objective  Goal status: INITIAL  2.  Patient will increase FOTO score to equal to or greater than 64 to demonstrate statistically significant improvement in mobility and quality of life.  Baseline: 46 Goal status: INITIAL   3.  Patient will increase NDI score by > 10 points to demonstrate increased function during ADLs.  Baseline: 31 Goal status: INITIAL   4.  Patient will increase DHI score by >18 points to demonstrate improved perceived disability in  vestibular symptoms, so that QOL can improve.  Baseline: 48 Goal status: INITIAL    ASSESSMENT:  CLINICAL IMPRESSION: Session terminated early as pt with recent new hx of multiple reported syncopal episodes, onset of increased/severe neck pain during session, onset of vertigo during session, unsteadiness and observed and reported confusion with difficulty following cues during session. PT called rapid response team and pt taken by rapid response team to ED.   OBJECTIVE IMPAIRMENTS: decreased activity tolerance, decreased ROM, dizziness, hypomobility, and pain.   ACTIVITY LIMITATIONS: lifting, bending, standing, squatting, sleeping, and stairs  PARTICIPATION LIMITATIONS: driving and occupation  PERSONAL FACTORS: Time since onset of injury/illness/exacerbation are also affecting patient's functional outcome.   REHAB POTENTIAL: Good  CLINICAL DECISION MAKING: Evolving/moderate complexity  EVALUATION COMPLEXITY:  Moderate  PLAN:  PT FREQUENCY: 2x/week  PT DURATION: 12 weeks  PLANNED INTERVENTIONS: 97164- PT Re-evaluation, 97110-Therapeutic exercises, 97530- Therapeutic activity, 97112- Neuromuscular re-education, 97535- Self Care, 82956- Manual therapy, 95992- Canalith repositioning, Dry Needling, Joint mobilization, Vestibular training, Cryotherapy, and Moist heat  PLAN FOR NEXT SESSION: Assess concussion/vestibular symptoms, cervical spine mobilization as tolerated, pain management, therex to improve cervical ROM and pain    Baird Kay, PT 05/27/2023, 11:52 AM   Baird Kay PT ,DPT Physical Therapist- [place]  [facility]

## 2023-05-27 NOTE — ED Triage Notes (Signed)
Patient to ED from PT for near syncope. Pt states she has had near syncopal episodes over the past month since having a concussion. States symptoms are getting worse rather than better. C/o bilateral tinging in hands, headache and neck pain since the concussion.

## 2023-05-27 NOTE — ED Provider Notes (Signed)
[facility] Provider Note    Event Date/Time   First MD Initiated Contact with Patient 05/27/23 1325     (approximate)  History   Chief Complaint: Near Syncope  HPI  Deborah Lambert is a 37 y.o. female with a past medical history of anxiety, PTSD, seizure disorder, presents to the emergency department for continued symptoms after head injury over 1 month ago.  According to the patient over 1 month ago she had a head injury in which she hit her head on a bed frame.  She states she had a CT scan that was negative at that time and was diagnosed with a concussion.  Patient has been following up with a specialist in [place], but states over the last week or 2 she has continued to have a headache but is now fatigued throughout the day family member states sometimes she sounds like she is slurring her words.  Patient states she has been getting dizzy and lightheaded.  She states often times she will wake up with energy however shortly after taking the medication she will become drowsy and tired once again.  I reviewed the patient's medications she is currently prescribed fluoxetine, topiramate and clonazepam.  Physical Exam   Triage Vital Signs: ED Triage Vitals [05/27/23 1206]  Encounter Vitals Group     BP 110/86     Systolic BP Percentile      Diastolic BP Percentile      Pulse Rate 85     Resp 18     Temp 98.6 F (37 C)     Temp Source Oral     SpO2 97 %     Weight 195 lb (88.5 kg)     Height 5\' 5"  (1.651 m)     Head Circumference      Peak Flow      Pain Score 10     Pain Loc      Pain Education      Exclude from Growth Chart     Most recent vital signs: Vitals:   05/27/23 1206  BP: 110/86  Pulse: 85  Resp: 18  Temp: 98.6 F (37 C)  SpO2: 97%    General: Awake, no distress.  CV:  Good peripheral perfusion.  Regular rate and rhythm  Resp:  Normal effort.  Equal breath sounds bilaterally.  Abd:  No distention.  Soft, nontender.  No  rebound or guarding.  ED Results / Procedures / Treatments   EKG  EKG viewed and interpreted by myself shows a normal sinus rhythm 84 bpm with a narrow QRS, normal axis, normal intervals, no concerning ST changes.  MEDICATIONS ORDERED IN ED: Medications - No data to display   IMPRESSION / MDM / ASSESSMENT AND PLAN / ED COURSE  I reviewed the triage vital signs and the nursing notes.  Patient's presentation is most consistent with acute presentation with potential threat to life or bodily function.  Patient presents emergency department for continued intermittent headaches fatigue weakness dizziness lightheadedness.  She blames her symptoms on the concussion she sustained greater than 1 month ago.  I discussed with the patient that it is unlikely that the concussion would still be contributing to her symptoms this far out however I do believe that the patient needs to follow-up with a neurologist for further evaluation.  I discussed with the patient the medications of topiramate and clonazepam could be causing the patient to feel drowsy throughout the day lightheaded or even dizzy at  times.  Patient states she was just started on clonazepam 1 week ago no history of prolonged benzodiazepine use.  I discussed a trial of discontinuing these 2 medications and following up with neurology.  Patient is agreeable to this plan.  Patient CBC and chemistry showed no concerning findings.  Urinalysis pending.  Alysis shows no concerning findings, pregnancy test negative.  FINAL CLINICAL IMPRESSION(S) / ED DIAGNOSES   Dizziness  Headache   Note:  This document was prepared using Dragon voice recognition software and may include unintentional dictation errors.   Minna Antis, MD 05/27/23 1425

## 2023-05-27 NOTE — Discharge Instructions (Addendum)
As we discussed you may continue to take the fluoxetine but please discontinue topiramate and clonazepam.  Please use Tylenol or ibuprofen as needed for headache/discomfort as written on the box.  Please call the number provided for neurology to arrange a follow-up appointment for further evaluation.  Return to the emergency department for any symptom concerning to yourself.

## 2023-05-28 ENCOUNTER — Encounter: Payer: 59 | Admitting: Obstetrics

## 2023-05-28 ENCOUNTER — Telehealth: Payer: Self-pay

## 2023-05-28 NOTE — Transitions of Care (Post Inpatient/ED Visit) (Signed)
   05/28/2023  Name: Deborah Lambert MRN: [id number] DOB: 01-Sep-1985  Today's TOC FU Call Status: Today's TOC FU Call Status:: Unsuccessful Call (1st Attempt) Unsuccessful Call (1st Attempt) Date: 05/28/23  Attempted to reach the patient regarding the most recent Inpatient/ED visit.  Follow Up Plan: Additional outreach attempts will be made to reach the patient to complete the Transitions of Care (Post Inpatient/ED visit) call.   Signature Karena Addison, LPN [facility] Nurse Health Advisor Direct Dial [phone]

## 2023-05-29 NOTE — Transitions of Care (Post Inpatient/ED Visit) (Signed)
   05/29/2023  Name: Deborah Lambert MRN: [id number] DOB: 1986-05-13  Today's TOC FU Call Status: Today's TOC FU Call Status:: Unsuccessful Call (2nd Attempt) Unsuccessful Call (1st Attempt) Date: 05/28/23 Unsuccessful Call (2nd Attempt) Date: 05/29/23  Attempted to reach the patient regarding the most recent Inpatient/ED visit.  Follow Up Plan: Additional outreach attempts will be made to reach the patient to complete the Transitions of Care (Post Inpatient/ED visit) call.   Signature Karena Addison, LPN [facility] Nurse Health Advisor Direct Dial [phone]

## 2023-06-02 ENCOUNTER — Other Ambulatory Visit: Payer: Self-pay | Admitting: Physician Assistant

## 2023-06-02 DIAGNOSIS — F419 Anxiety disorder, unspecified: Secondary | ICD-10-CM

## 2023-06-03 ENCOUNTER — Ambulatory Visit: Payer: 59 | Attending: Family Medicine | Admitting: Physical Therapy

## 2023-06-03 ENCOUNTER — Other Ambulatory Visit: Payer: Self-pay

## 2023-06-03 ENCOUNTER — Encounter: Payer: Self-pay | Admitting: Physical Therapy

## 2023-06-03 DIAGNOSIS — M549 Dorsalgia, unspecified: Secondary | ICD-10-CM | POA: Diagnosis present

## 2023-06-03 DIAGNOSIS — R42 Dizziness and giddiness: Secondary | ICD-10-CM | POA: Diagnosis present

## 2023-06-03 DIAGNOSIS — N62 Hypertrophy of breast: Secondary | ICD-10-CM | POA: Insufficient documentation

## 2023-06-03 DIAGNOSIS — M6281 Muscle weakness (generalized): Secondary | ICD-10-CM | POA: Diagnosis present

## 2023-06-03 DIAGNOSIS — S060X9D Concussion with loss of consciousness of unspecified duration, subsequent encounter: Secondary | ICD-10-CM | POA: Diagnosis present

## 2023-06-03 DIAGNOSIS — M542 Cervicalgia: Secondary | ICD-10-CM | POA: Diagnosis present

## 2023-06-03 DIAGNOSIS — R2681 Unsteadiness on feet: Secondary | ICD-10-CM | POA: Diagnosis present

## 2023-06-03 NOTE — Transitions of Care (Post Inpatient/ED Visit) (Signed)
   06/03/2023  Name: Deborah Lambert MRN: [id number] DOB: [birth year]  Today's TOC FU Call Status: Today's TOC FU Call Status:: Unsuccessful Call (2nd Attempt) Unsuccessful Call (1st Attempt) Date: 05/28/23 Unsuccessful Call (2nd Attempt) Date: 05/29/23  Attempted to reach the patient regarding the most recent Inpatient/ED visit. Patient already seen in office Follow Up Plan: No further outreach attempts will be made at this time. We have been unable to contact the patient.  Signature Karena Addison, LPN [facility] Nurse Health Advisor Direct Dial [phone]

## 2023-06-03 NOTE — Therapy (Unsigned)
OUTPATIENT PHYSICAL THERAPY TREATMENT   Patient Name: Deborah Lambert MRN: [id number] DOB:[birth year], 37 y.o., female Today's Date: 06/03/2023   PCP: Debera Lat, PA-C REFERRING PROVIDER: [name], MD/ [name], [name], DO  END OF SESSION:  PT End of Session - 06/03/23 0802     Visit Number 3    Number of Visits 24    Date for PT Re-Evaluation 07/15/23    Progress Note Due on Visit 10    PT Start Time 0804    PT Stop Time 0845    PT Time Calculation (min) 41 min    Activity Tolerance Patient limited by pain    Behavior During Therapy Anxious;Restless;Flat affect   pt confused, reports confusion, has difficulty following cues             Past Medical History:  Diagnosis Date   Anxiety    Bell's palsy    Cluster headaches    Focal seizures (HCC)    PTSD (post-traumatic stress disorder)    Seizures (HCC)    Past Surgical History:  Procedure Laterality Date   right arm surgery     Patient Active Problem List   Diagnosis Date Noted   Atopic dermatitis 05/24/2023   Moderate persistent asthma, uncomplicated 05/24/2023   Acute stress reaction 05/13/2023   Postconcussion syndrome 05/08/2023   At high risk for breast cancer 04/26/2023   Family history of breast cancer gene mutation in first degree relative 04/26/2023   Genetic testing 04/16/2023   Generalized edema 11/12/2022   Dizziness 11/12/2022   Encounter for screening for HIV 11/12/2022   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/12/2022   Encounter for hepatitis C screening test for low risk patient 11/12/2022   Obesity (BMI 30-39.9) 10/31/2022   Anxiety 10/31/2022   Uncomplicated asthma 10/31/2022   Eczema of both hands 10/31/2022   PTSD (post-traumatic stress disorder) 10/31/2022   Encounter for medical examination to establish care 10/31/2022   Seasonal allergies 10/31/2022   Depression, recurrent (HCC) 10/31/2022    ONSET DATE: 04/08/2023  REFERRING DIAG:  J19.0X9A (ICD-10-CM) -  Concussion with loss of consciousness, initial encounter  M54.2 (ICD-10-CM) - Neck pain    THERAPY DIAG:  Concussion with loss of consciousness, subsequent encounter  Neck pain  Dizziness and giddiness  Unsteadiness on feet  Rationale for Evaluation and Treatment: Rehabilitation  SUBJECTIVE:                                                                                                                                                                                             SUBJECTIVE STATEMENT:  Reports that HA, dizziness,  double vision have al stopped since d/c from HA medication.   No report report of pain at start of session. Occasional neck popping, but nothing beyond usual. No continued n/t since medication adjustment. Reports occasional tightness in upper back, but not present at start of PT treatment.   PERTINENT HISTORY:    Pt stated that concussion occurred by bedframe falling on top of her head while putting together a bedframe. Pt stated that she went to the hospital 3 days later. Pt recalled that she got a concussion when she was a kid when a ceiling fan fell on her head. Pt stated that concussion symptoms include metallic taste in mouth, sensitivity to lights, dizziness (loss of balance where she has to catch herself when walking a lot) and feeling of posterior head floating and tingling. Pt stated that she is getting 4-4.5 hours of sleep ever since onset. Pt stated that she went to [facility] earlier this week and was told symptoms were getting worse. Pt reported 8/10 on NPS in posterior skull and cervical spine. Pt stated that she has to curl up in a ball to be comfortable. Pt stated that medications keeps nausea and headches minimal. Pt takes omeprazole (40 mg), topiramate (25 mg), clonazepam (0.5 mg), and fluoxetine (10 mg) twice a day. 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 Pt lives with husband who works as a Naval architect.  Pt accompanied by:  selfUncomplicated asthma, postconcussion syndrome, eczema, supraspinatus syndrome of R shoulder, obesity, anxiety, PTSD, depression (recurrent), generalized edema, dizziness, acute stress reaction   PAIN:  Are you having pain?   PRECAUTIONS: N/A  RED FLAGS: None   WEIGHT BEARING RESTRICTIONS: No  FALLS: Has patient fallen in last 6 months?  Recalls occasional stumbles due to symptoms  LIVING ENVIRONMENT: Lives with: lives with their spouse Lives in: House/apartment Stairs: Yes: External: 7 steps; handrails: bilateral Has following equipment at home:  N/A  PLOF: Independent  PATIENT GOALS: quit having shooting pains and dizziness symptoms  OBJECTIVE:  Note: Objective measures were completed at Evaluation unless otherwise noted.  DIAGNOSTIC FINDINGS: Head CT on 04/10/2023: No acute intracranial abnormality.   COGNITION: Overall cognitive status: Within functional limits for tasks assessed   SENSATION: Not tested  COORDINATION: Not tested  EDEMA:  Not measured  MUSCLE TONE:Not measured  MUSCLE LENGTH: Not tested  DTRs:  Not tested  POSTURE: rounded shoulders and forward head   PALPATION/CPA TESTING/ JOINT MOBILITY TESTING: CPA Testing:  C1-C7: Hypomobile  T1-T5: hypomobile.  T6-T12 WFL   TP noted in Bil middel trap, rhomboids L>R.  TP in Spen  CERVICAL ROM:    Active ROM A/PROM (deg) eval   Flexion 10* 40  Extension 25* 35  Right lateral flexion 22* 40  Left lateral flexion 25* 48  Right rotation 18* 65*  Left rotation 23* 70   (Blank rows = not tested) *indicates pain with test/ movement    UPPER EXTREMITY MMT: TEST VISIT 2 or when pt s/s tolerate   MMT Right eval Left eval  Shoulder flexion 4 4+  Shoulder extension 4+ 4+  Shoulder abduction 4+ 5  Shoulder adduction    Shoulder extension    Shoulder internal rotation 4+ 4+  Shoulder external rotation 4+ 4  Middle trapezius 4 4-  Lower trapezius 4 4-  Elbow flexion 4+ 4+  Elbow  extension 4+ 4+  Wrist flexion    Wrist extension    Wrist ulnar deviation  Wrist radial deviation    Wrist pronation    Wrist supination    Grip strength     (Blank rows = not tested)  CERVICAL SPECIAL TESTS:  Upper limb tension test (ULTT): Positive on R side   FUNCTIONAL TESTS:  N/A  PATIENT SURVEYS:  DHI 48 NDI 31 FOTO NECK 46. Tspine: 75    TODAY'S TREATMENT:                                                                                                                              DATE: 06/03/23    Sitting 110/82 (93) HR 77 Standing: 117/89 (96) HR 93  Thoracic assessment as listed above. STM with AP mobs x 20 sec per segment from T1-T7. STM to Bil middle trap/rhamboid x 2 min with TP release.   Seated HEP for improved cervical ROM and lower trap activation. As listed below.  - Seated Scapular Retraction  - 10 reps - 3 hold - Seated Upper Trapezius Stretch  - 2 reps bil  - 20 hold - Seated Levator Scapulae Stretch  - 2 reps bil  - 20 hold  EDUCATION Education details: vitals, reason for need to call rapid Person educated: Patient Education method: Explanation Education comprehension: verbalized understanding, pt possibly confused with explanation   HOME EXERCISE PROGRAM: Access Code: [email or web address] URL: [email or web address] Date: 06/03/2023 Prepared by: Grier Rocher  Exercises - Seated Scapular Retraction  - 1 x daily - 7 x weekly - 3 sets - 10 reps - 3 hold - Seated Upper Trapezius Stretch  - 1 x daily - 7 x weekly - 2 sets - 2 reps - 20 hold - Seated Levator Scapulae Stretch  - 1 x daily - 7 x weekly - 2 sets - 2 reps - 20 hold  GOALS: Goals reviewed with patient? Yes   SHORT TERM GOALS: Target date: 06/20/2023    Patient will be independent in home exercise program to improve strength/mobility for better functional independence with ADLs. Baseline: No HEP currently  Goal status: INITIAL  LONG TERM GOALS: Target date:  08/08/2023   1.  Patient will achieve cervical ROM WFL and without pain in order to demonstrate independence with ADLs and be able to return to work.   Baseline:  See eval objective  Goal status: INITIAL  2.  Patient will increase FOTO score to equal to or greater than 64 to demonstrate statistically significant improvement in mobility and quality of life.  Baseline: 46 Goal status: INITIAL   3.  Patient will increase NDI score by > 10 points to demonstrate increased function during ADLs.  Baseline: 31 Goal status: INITIAL   4.  Patient will increase DHI score by >18 points to demonstrate improved perceived disability in vestibular symptoms, so that QOL can improve.  Baseline: 48 Goal status: INITIAL    ASSESSMENT:  CLINICAL IMPRESSION: Pt reports to PT motivated to participate with new PT orders for neck and back pain.  Pt reports significantly improved neck pain and post concussive s/s following ED visit and d/c from multiple HA medication. Mild stiffness reported and pain with cervical rotation and side bending. Was noted to have rounded shoulders, forward head, and pain/weakness in mid and lower trap region. Pt will benefit from PT to continue to address neck pain and postural weakness to allow improved overall QoL and return to PLOF.   OBJECTIVE IMPAIRMENTS: decreased activity tolerance, decreased ROM, dizziness, hypomobility, and pain.   ACTIVITY LIMITATIONS: lifting, bending, standing, squatting, sleeping, and stairs  PARTICIPATION LIMITATIONS: driving and occupation  PERSONAL FACTORS: Time since onset of injury/illness/exacerbation are also affecting patient's functional outcome.   REHAB POTENTIAL: Good  CLINICAL DECISION MAKING: Evolving/moderate complexity  EVALUATION COMPLEXITY: Moderate  PLAN:  PT FREQUENCY: 2x/week  PT DURATION: 12 weeks  PLANNED INTERVENTIONS: 97164- PT Re-evaluation, 97110-Therapeutic exercises, 97530- Therapeutic activity, 97112-  Neuromuscular re-education, 97535- Self Care, 44010- Manual therapy, [phone]- Canalith repositioning, 97014- Electrical stimulation (unattended), [id number]- Electrical stimulation (manual), [id number]- Ultrasound, 66440- Traction (mechanical), Balance training, Taping, Dry Needling, Joint mobilization, Joint manipulation, Spinal manipulation, Spinal mobilization, Vestibular training, Cryotherapy, and Moist heat  PLAN FOR NEXT SESSION: cervical spine mobilization as tolerated, pain management, therex to improve cervical ROM and pain    Golden Pop, PT 06/03/2023, 8:11 AM   Golden Pop PT ,DPT Physical Therapist- [place]  [facility]

## 2023-06-04 ENCOUNTER — Ambulatory Visit (INDEPENDENT_AMBULATORY_CARE_PROVIDER_SITE_OTHER): Payer: 59 | Admitting: Physician Assistant

## 2023-06-04 ENCOUNTER — Encounter: Payer: Self-pay | Admitting: Physician Assistant

## 2023-06-04 VITALS — BP 125/82 | HR 73 | Ht 65.5 in | Wt 190.0 lb

## 2023-06-04 DIAGNOSIS — F4310 Post-traumatic stress disorder, unspecified: Secondary | ICD-10-CM

## 2023-06-04 DIAGNOSIS — E669 Obesity, unspecified: Secondary | ICD-10-CM | POA: Diagnosis not present

## 2023-06-04 DIAGNOSIS — F0781 Postconcussional syndrome: Secondary | ICD-10-CM | POA: Diagnosis not present

## 2023-06-04 NOTE — Progress Notes (Unsigned)
Established patient visit  Patient: Deborah Lambert   DOB: 10/25/1985   37 y.o. Female  MRN: [id number] Visit Date: 06/04/2023  Today's healthcare provider: Debera Lat, PA-C   No chief complaint on file.  Subjective    HPI  *** Discussed the use of AI scribe software for clinical note transcription with the patient, who gave verbal consent to proceed.  History of Present Illness               05/07/2023    4:17 PM 11/12/2022    4:08 PM 10/31/2022    9:47 AM  Depression screen PHQ 2/9  Decreased Interest 0 1 1  Down, Depressed, Hopeless 1 0 1  PHQ - 2 Score 1 1 2   Altered sleeping 1 1 3   Tired, decreased energy 1 1 3   Change in appetite 1 1 3   Feeling bad or failure about yourself  0 1 1  Trouble concentrating 0 0 1  Moving slowly or fidgety/restless 0 0 0  Suicidal thoughts 0 0 0  PHQ-9 Score 4 5 13   Difficult doing work/chores Somewhat difficult Not difficult at all Somewhat difficult      10/31/2022    9:50 AM  GAD 7 : Generalized Anxiety Score  Nervous, Anxious, on Edge 1  Control/stop worrying 3  Worry too much - different things 3  Trouble relaxing 3  Restless 3  Easily annoyed or irritable 3  Afraid - awful might happen 0  Total GAD 7 Score 16  Anxiety Difficulty Somewhat difficult    Medications: Outpatient Medications Prior to Visit  Medication Sig   albuterol (VENTOLIN HFA) 108 (90 Base) MCG/ACT inhaler Inhale 2 puffs into the lungs every 6 (six) hours as needed for wheezing or shortness of breath. Inhale into the lungs every 6 (six) hours as needed for wheezing or shortness of breath.   BREO ELLIPTA 200-25 MCG/ACT AEPB 1 puff daily.   clonazePAM (KLONOPIN) 0.5 MG tablet Take 1 tablet (0.5 mg total) by mouth 2 (two) times daily. (Patient not taking: Reported on 06/03/2023)   Crisaborole (EUCRISA) 2 % OINT 1 application Externally Twice a day for 30 days   fluocinonide cream (LIDEX) 0.05 % Apply 1 Application topically 2 (two) times daily.    FLUoxetine (PROZAC) 10 MG capsule Take 1 capsule (10 mg total) by mouth daily for 7 days, THEN 2 capsules (20 mg total) daily for 28 days.   meclizine (ANTIVERT) 25 MG tablet Take 1 tablet (25 mg total) by mouth 3 (three) times daily as needed for dizziness or nausea.   Multiple Vitamin (MULTIVITAMIN) tablet Take 1 tablet by mouth daily.   omeprazole (PRILOSEC) 20 MG capsule Take 1 capsule (20 mg total) by mouth daily.   ondansetron (ZOFRAN-ODT) 4 MG disintegrating tablet Take 1 tablet (4 mg total) by mouth every 8 (eight) hours as needed.   topiramate (TOPAMAX) 25 MG tablet Take 1 tablet (25 mg total) by mouth 2 (two) times daily. To prevent headache (Patient not taking: Reported on 06/03/2023)   triamcinolone ointment (KENALOG) 0.5 % Apply 1 Application topically 2 (two) times daily.   No facility-administered medications prior to visit.    Review of Systems Except see HPI   {Insert previous labs (optional):23779} {See past labs  Heme  Chem  Endocrine  Serology  Results Review (optional):1}   Objective    There were no vitals taken for this visit. {Insert last BP/Wt (optional):23777}{See vitals history (optional):1}   Physical Exam   No  results found for any visits on 06/04/23.  Assessment & Plan    *** Assessment and Plan              No follow-ups on file.      [facility]

## 2023-06-06 ENCOUNTER — Ambulatory Visit: Payer: 59

## 2023-06-06 DIAGNOSIS — S060X9D Concussion with loss of consciousness of unspecified duration, subsequent encounter: Secondary | ICD-10-CM | POA: Diagnosis not present

## 2023-06-06 DIAGNOSIS — M542 Cervicalgia: Secondary | ICD-10-CM

## 2023-06-06 DIAGNOSIS — M6281 Muscle weakness (generalized): Secondary | ICD-10-CM

## 2023-06-06 NOTE — Therapy (Signed)
OUTPATIENT PHYSICAL THERAPY TREATMENT   Patient Name: Deborah Lambert MRN: [id number] DOB:[birth year], 37 y.o., female Today's Date: 06/06/2023   PCP: Debera Lat, PA-C REFERRING PROVIDER: [name], MD/ [name], [name], DO  END OF SESSION:  PT End of Session - 06/06/23 1552     Visit Number 4    Number of Visits 24    Date for PT Re-Evaluation 07/15/23    Progress Note Due on Visit 10    PT Start Time 1550    PT Stop Time 1633    PT Time Calculation (min) 43 min    Activity Tolerance Patient tolerated treatment well;No increased pain    Behavior During Therapy [facility] for tasks assessed/performed   pt confused, reports confusion, has difficulty following cues             Past Medical History:  Diagnosis Date   Anxiety    Bell's palsy    Cluster headaches    Focal seizures (HCC)    PTSD (post-traumatic stress disorder)    Seizures (HCC)    Past Surgical History:  Procedure Laterality Date   right arm surgery     Patient Active Problem List   Diagnosis Date Noted   Atopic dermatitis 05/24/2023   Moderate persistent asthma, uncomplicated 05/24/2023   Acute stress reaction 05/13/2023   Postconcussion syndrome 05/08/2023   At high risk for breast cancer 04/26/2023   Family history of breast cancer gene mutation in first degree relative 04/26/2023   Genetic testing 04/16/2023   Generalized edema 11/12/2022   Dizziness 11/12/2022   Encounter for screening for HIV 11/12/2022   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/12/2022   Encounter for hepatitis C screening test for low risk patient 11/12/2022   Obesity (BMI 30-39.9) 10/31/2022   Anxiety 10/31/2022   Uncomplicated asthma 10/31/2022   Eczema of both hands 10/31/2022   PTSD (post-traumatic stress disorder) 10/31/2022   Encounter for medical examination to establish care 10/31/2022   Seasonal allergies 10/31/2022   Depression, recurrent (HCC) 10/31/2022    ONSET DATE: 04/08/2023  REFERRING  DIAG:  W09.0X9A (ICD-10-CM) - Concussion with loss of consciousness, initial encounter  M54.2 (ICD-10-CM) - Neck pain    THERAPY DIAG:  Cervicalgia  Muscle weakness (generalized)  Rationale for Evaluation and Treatment: Rehabilitation  SUBJECTIVE:                                                                                                                                                                                             SUBJECTIVE STATEMENT:  Pt reports headaches have improved a lot since med changes, she reports no  falls or dizziness. She does feel a small pain at bottom/base of skull in mm, says this occurs periodically.    PERTINENT HISTORY:    Pt stated that concussion occurred by bedframe falling on top of her head while putting together a bedframe. Pt stated that she went to the hospital 3 days later. Pt recalled that she got a concussion when she was a kid when a ceiling fan fell on her head. Pt stated that concussion symptoms include metallic taste in mouth, sensitivity to lights, dizziness (loss of balance where she has to catch herself when walking a lot) and feeling of posterior head floating and tingling. Pt stated that she is getting 4-4.5 hours of sleep ever since onset. Pt stated that she went to MD earlier this week and was told symptoms were getting worse. Pt reported 8/10 on NPS in posterior skull and cervical spine. Pt stated that she has to curl up in a ball to be comfortable. Pt stated that medications keeps nausea and headches minimal. Pt takes omeprazole (40 mg), topiramate (25 mg), clonazepam (0.5 mg), and fluoxetine (10 mg) twice a day. 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 Pt lives with husband who works as a [employer] architect.  Pt accompanied by: selfUncomplicated asthma, postconcussion syndrome, eczema, supraspinatus syndrome of R shoulder, obesity, anxiety, PTSD, depression (recurrent), generalized edema, dizziness, acute  stress reaction   PAIN:  Are you having pain?   PRECAUTIONS: N/A  RED FLAGS: None   WEIGHT BEARING RESTRICTIONS: No  FALLS: Has patient fallen in last 6 months?  Recalls occasional stumbles due to symptoms  LIVING ENVIRONMENT: Lives with: lives with their spouse Lives in: House/apartment Stairs: Yes: External: 7 steps; handrails: bilateral Has following equipment at home:  N/A  PLOF: Independent  PATIENT GOALS: quit having shooting pains and dizziness symptoms  OBJECTIVE:  Note: Objective measures were completed at Evaluation unless otherwise noted.  DIAGNOSTIC FINDINGS: Head CT on 04/10/2023: No acute intracranial abnormality.   COGNITION: Overall cognitive status: Within functional limits for tasks assessed   SENSATION: Not tested  COORDINATION: Not tested  EDEMA:  Not measured  MUSCLE TONE:Not measured  MUSCLE LENGTH: Not tested  DTRs:  Not tested  POSTURE: rounded shoulders and forward head   PALPATION/CPA TESTING/ JOINT MOBILITY TESTING: CPA Testing:  C1-C7: Hypomobile  T1-T5: hypomobile.  T6-T12 WFL   TP noted in Bil middel trap, rhomboids L>R.  TP in Spen  CERVICAL ROM:    Active ROM A/PROM (deg) eval   Flexion 10* 40  Extension 25* 35  Right lateral flexion 22* 40  Left lateral flexion 25* 48  Right rotation 18* 65*  Left rotation 23* 70   (Blank rows = not tested) *indicates pain with test/ movement    UPPER EXTREMITY MMT: TEST VISIT 2 or when pt s/s tolerate   MMT Right eval Left eval  Shoulder flexion 4 4+  Shoulder extension 4+ 4+  Shoulder abduction 4+ 5  Shoulder adduction    Shoulder extension    Shoulder internal rotation 4+ 4+  Shoulder external rotation 4+ 4  Middle trapezius 4 4-  Lower trapezius 4 4-  Elbow flexion 4+ 4+  Elbow extension 4+ 4+  Wrist flexion    Wrist extension    Wrist ulnar deviation    Wrist radial deviation    Wrist pronation    Wrist supination    Grip strength     (Blank rows  = not tested)  CERVICAL SPECIAL TESTS:  Upper limb tension test (ULTT): Positive on R side   FUNCTIONAL TESTS:  N/A  PATIENT SURVEYS:  DHI 48 NDI 31 FOTO NECK 46. Tspine: 75    TODAY'S TREATMENT:                                                                                                                              DATE: 06/06/23   TE:  Exercises - Seated Scapular Retraction  - 2 sets - 10 reps - 3 hold/rep  Seated stability ball rollouts FWD/BCWKD 2x10   Seated stability ball LTL rollouts 10x each way  Seated Upper Trapezius Stretch  - 2 reps x 20 hold each side  Seated Levator Scapulae Stretch  - 2 sets  x 20 sec hold each side  Supine Chin tucks 10x 3 sec h  Pt supine on plinth:  LTRs x multiple reps each side with 3-5 sec holds Open book 2x12 each side with 1-2 sec holds Supine chin tuck x multiple reps, cuing for technique: VC/TC/DEMO   RTB shoulder ER 15x, 20x bilat UE RTB shoulder row 2x15 bliat UE  Postural intervention/ wall slides 10x    Manual therapy: Pt supine on plinth PT provides occip release - c/c sign initially reported L lateral occipit, pt reports improvement in pain with occipital release x 4 min   HEP UPDATED, printout provided Access Code: [email or web address] URL: [email or web address] Date: 06/06/2023 Prepared by: Temple Pacini  Exercises - Seated Scapular Retraction  - 1 x daily - 7 x weekly - 3 sets - 10 reps - 3 hold - Seated Upper Trapezius Stretch  - 1 x daily - 7 x weekly - 2 sets - 2 reps - 20 hold - Seated Levator Scapulae Stretch  - 1 x daily - 7 x weekly - 2 sets - 2 reps - 20 hold - Supine Cervical Retraction with Towel  - 1 x daily - 7 x weekly - 2 sets - 10 reps - 3 sec hold  EDUCATION Education details: vitals, reason for need to call rapid Person educated: Patient Education method: Explanation Education comprehension: verbalized understanding, pt possibly confused with explanation   HOME EXERCISE  PROGRAM: Access Code: [email or web address] URL: [email or web address] Date: 06/03/2023 Prepared by: Grier Rocher  Exercises - Seated Scapular Retraction  - 1 x daily - 7 x weekly - 3 sets - 10 reps - 3 hold - Seated Upper Trapezius Stretch  - 1 x daily - 7 x weekly - 2 sets - 2 reps - 20 hold - Seated Levator Scapulae Stretch  - 1 x daily - 7 x weekly - 2 sets - 2 reps - 20 hold  GOALS: Goals reviewed with patient? Yes   SHORT TERM GOALS: Target date: 06/20/2023    Patient will be independent in home exercise program to improve strength/mobility for better functional independence with ADLs. Baseline: No HEP currently  Goal status: INITIAL  LONG TERM GOALS: Target date:  08/08/2023   1.  Patient will achieve cervical ROM WFL and without pain in order to demonstrate independence with ADLs and be able to return to work.   Baseline:  See eval objective  Goal status: INITIAL  2.  Patient will increase FOTO score to equal to or greater than 64 to demonstrate statistically significant improvement in mobility and quality of life.  Baseline: 46 Goal status: INITIAL   3.  Patient will increase NDI score by > 10 points to demonstrate increased function during ADLs.  Baseline: 31 Goal status: INITIAL   4.  Patient will increase DHI score by >18 points to demonstrate improved perceived disability in vestibular symptoms, so that QOL can improve.  Baseline: 48 Goal status: INITIAL    ASSESSMENT:  CLINICAL IMPRESSION: Pt continues to report/exhibit significant improvement, reporting no dizziness, improvement in headaches and pain since med change from ED visit. She tolerates all interventions well without increased symptoms, pt even felt improvement in pain felt near L lateral occiput with manual therapy. Pt will benefit from PT to continue to address neck pain and postural weakness to allow improved overall QoL and return to PLOF.   OBJECTIVE IMPAIRMENTS: decreased activity tolerance,  decreased ROM, dizziness, hypomobility, and pain.   ACTIVITY LIMITATIONS: lifting, bending, standing, squatting, sleeping, and stairs  PARTICIPATION LIMITATIONS: driving and occupation  PERSONAL FACTORS: Time since onset of injury/illness/exacerbation are also affecting patient's functional outcome.   REHAB POTENTIAL: Good  CLINICAL DECISION MAKING: Evolving/moderate complexity  EVALUATION COMPLEXITY: Moderate  PLAN:  PT FREQUENCY: 2x/week  PT DURATION: 12 weeks  PLANNED INTERVENTIONS: 97164- PT Re-evaluation, 97110-Therapeutic exercises, 97530- Therapeutic activity, 97112- Neuromuscular re-education, 97535- Self Care, 29562- Manual therapy, [phone]- Canalith repositioning, 97014- Electrical stimulation (unattended), [id number]- Electrical stimulation (manual), 97035- Ultrasound, 57846- Traction (mechanical), Balance training, Taping, Dry Needling, Joint mobilization, Joint manipulation, Spinal manipulation, Spinal mobilization, Vestibular training, Cryotherapy, and Moist heat  PLAN FOR NEXT SESSION: cervical spine mobilization as tolerated, pain management, therex to improve cervical ROM and pain, postural interventions, UE strengthening/ tspine mobility   Baird Kay, PT 06/06/2023, 4:47 PM   Baird Kay PT ,DPT Physical Therapist- [place]  [facility]

## 2023-06-11 ENCOUNTER — Ambulatory Visit: Payer: 59 | Admitting: Physical Therapy

## 2023-06-11 DIAGNOSIS — S060X9D Concussion with loss of consciousness of unspecified duration, subsequent encounter: Secondary | ICD-10-CM

## 2023-06-11 DIAGNOSIS — M542 Cervicalgia: Secondary | ICD-10-CM

## 2023-06-11 DIAGNOSIS — M6281 Muscle weakness (generalized): Secondary | ICD-10-CM
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
## 2023-06-12 ENCOUNTER — Encounter: Payer: Self-pay | Admitting: Physical Therapy

## 2023-06-13 ENCOUNTER — Ambulatory Visit: Payer: 59 | Admitting: Physical Therapy

## 2023-06-13 ENCOUNTER — Ambulatory Visit: Payer: Self-pay | Admitting: Obstetrics and Gynecology

## 2023-06-14 ENCOUNTER — Encounter: Payer: Self-pay | Admitting: Obstetrics

## 2023-06-14 ENCOUNTER — Ambulatory Visit (INDEPENDENT_AMBULATORY_CARE_PROVIDER_SITE_OTHER): Payer: 59 | Admitting: Obstetrics

## 2023-06-14 ENCOUNTER — Other Ambulatory Visit (HOSPITAL_COMMUNITY)
Admission: RE | Admit: 2023-06-14 | Discharge: 2023-06-14 | Disposition: A | Discharge status: Home / Self Care | Payer: 59 | Source: Ambulatory Visit | Attending: Obstetrics | Admitting: Obstetrics

## 2023-06-14 VITALS — BP 103/72 | HR 80 | Ht 65.0 in | Wt 190.0 lb

## 2023-06-14 DIAGNOSIS — Z01419 Encounter for gynecological examination (general) (routine) without abnormal findings: Secondary | ICD-10-CM | POA: Insufficient documentation

## 2023-06-14 DIAGNOSIS — N926 Irregular menstruation, unspecified: Secondary | ICD-10-CM

## 2023-06-14 DIAGNOSIS — Z124 Encounter for screening for malignant neoplasm of cervix: Secondary | ICD-10-CM | POA: Diagnosis present

## 2023-06-14 DIAGNOSIS — Z87828 Personal history of other (healed) physical injury and trauma: Secondary | ICD-10-CM | POA: Insufficient documentation

## 2023-06-14 DIAGNOSIS — Z7689 Persons encountering health services in other specified circumstances: Secondary | ICD-10-CM

## 2023-06-14 MED ORDER — NORETHIN ACE-ETH ESTRAD-FE 1-20 MG-MCG PO TABS: 1.0000 | ORAL_TABLET | Freq: Every day | ORAL | 3 refills | Status: DC
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
## 2023-06-19 ENCOUNTER — Ambulatory Visit: Payer: 59

## 2023-06-20 LAB — CYTOLOGY - PAP
Comment: NEGATIVE
Comment: NEGATIVE
Comment: NEGATIVE
Diagnosis: HIGH — AB
HPV 16: POSITIVE — AB
HPV 18 / 45: NEGATIVE
High risk HPV: POSITIVE — AB

## 2023-06-21 ENCOUNTER — Ambulatory Visit: Payer: 59 | Admitting: Physical Therapy

## 2023-06-21 ENCOUNTER — Telehealth: Payer: Self-pay | Admitting: Obstetrics

## 2023-06-21 ENCOUNTER — Telehealth: Payer: Self-pay

## 2023-06-21 NOTE — Telephone Encounter (Signed)
Patient called with concerns of pap smear results. She saw her results on MyChart.   Component Ref Range & Units (hover) 7 d ago  High risk HPV Positive Abnormal   HPV 16 Positive Abnormal   HPV 18 / 45 Negative  Adequacy Satisfactory for evaluation; transformation zone component PRESENT.  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    Thank you.

## 2023-06-21 NOTE — Telephone Encounter (Signed)
Called Chao to discuss results of Pap: HSIL with +HPV16. Discussed that recommended treatment is LEEP procedure, and this can be done in the office or in the OR with sedation. Rylea prefers it to be done in the OR. Briefly discussed procedure and recovery. Tressie will set up pre-op appt with Dr. Valentino Saxon.  Glenetta Borg, CNM

## 2023-06-25 ENCOUNTER — Ambulatory Visit: Payer: 59

## 2023-06-27 ENCOUNTER — Ambulatory Visit: Payer: 59 | Admitting: Physical Therapy

## 2023-07-01 ENCOUNTER — Encounter: Payer: Self-pay | Admitting: Physician Assistant

## 2023-07-01 ENCOUNTER — Ambulatory Visit: Payer: Self-pay

## 2023-07-01 ENCOUNTER — Ambulatory Visit: Payer: 59 | Admitting: Physical Therapy

## 2023-07-01 NOTE — Telephone Encounter (Signed)
Patient has been called and advised to go to urgent care.  During our conversations through Deborah Lambert she never mentioned anything about a concussion in November.  January 2 was the soonest available appointment and she appeared to be ok with that. Now with this added information recommendation was changed and patient was advised.  Appears to understand new advice.

## 2023-07-01 NOTE — Telephone Encounter (Signed)
Message from [place] B sent at 07/01/2023 11:59 AM EST  Summary: sleepinness and tired all the time   Pt called in feeling sleepy and tired, wants to sleep all the time, pt wants to be seen sooner than Jan 2         Chief Complaint: severe fatigue and sleepiness. No energy Symptoms: no other sx Frequency: 1 week Pertinent Negatives: Patient denies CP, fever, SOB, N/V/D, bleeding, other areas of pain Disposition: [] ED /[] Urgent Care (no appt availability in office) / [] Appointment(In office/virtual)/ []  [place] [facility]/ [] Home Care/ [] Refused Recommended Disposition /[] [place] [facility]/ [x]  Follow-up with PCP Additional Notes: no appts until 07/03/21 at 3 pm- wait listed  Has h/o concussion  Reason for Disposition  [1] MODERATE weakness (i.e., interferes with work, school, normal activities) AND [2] persists > 3 days  Answer Assessment - Initial Assessment Questions 1. DESCRIPTION: "Describe how you are feeling."     Wants to sleep al the time and fatigue 2. SEVERITY: "How bad is it?"  "Can you stand and walk?"   - MILD (0-3): Feels weak or tired, but does not interfere with work, school or normal activities.   - MODERATE (4-7): Able to stand and walk; weakness interferes with work, school, or normal activities.   - SEVERE (8-10): Unable to stand or walk; unable to do usual activities.     mild 3. ONSET: "When did these symptoms begin?" (e.g., hours, days, weeks, months)     1 week 4. CAUSE: "What do you think is causing the weakness or fatigue?" (e.g., not drinking enough fluids, medical problem, trouble sleeping)     Concussion 1 month  5. NEW MEDICINES:  "Have you started on any new medicines recently?" (e.g., opioid pain medicines, benzodiazepines, muscle relaxants, antidepressants, antihistamines, neuroleptics, beta blockers)     Stopped BCP  6. OTHER SYMPTOMS: "Do you have any other symptoms?" (e.g., chest pain, fever, cough, SOB, vomiting, diarrhea, bleeding,  other areas of pain)     Abnormal PAP smear,mild cough  Protocols used: Weakness (Generalized) and Fatigue-A-AH

## 2023-07-03 DIAGNOSIS — D069 Carcinoma in situ of cervix, unspecified: Secondary | ICD-10-CM

## 2023-07-03 HISTORY — DX: Carcinoma in situ of cervix, unspecified: D06.9

## 2023-07-04 ENCOUNTER — Ambulatory Visit (INDEPENDENT_AMBULATORY_CARE_PROVIDER_SITE_OTHER): Payer: 59 | Admitting: Family Medicine

## 2023-07-04 ENCOUNTER — Encounter: Payer: Self-pay | Admitting: Family Medicine

## 2023-07-04 VITALS — BP 114/82 | HR 82 | Ht 65.5 in | Wt 190.0 lb

## 2023-07-04 DIAGNOSIS — G4719 Other hypersomnia: Secondary | ICD-10-CM | POA: Diagnosis not present

## 2023-07-04 DIAGNOSIS — Z87820 Personal history of traumatic brain injury: Secondary | ICD-10-CM

## 2023-07-04 DIAGNOSIS — R5383 Other fatigue: Secondary | ICD-10-CM

## 2023-07-04 DIAGNOSIS — Z862 Personal history of diseases of the blood and blood-forming organs and certain disorders involving the immune mechanism: Secondary | ICD-10-CM

## 2023-07-04 NOTE — Progress Notes (Signed)
 Acute visit   Patient: Deborah Lambert   DOB: Sep 28, 1985   38 y.o. Female  MRN: [id number]  Chief Complaint  Patient presents with   Fatigue    severe fatigue and sleepiness. No energy X 1.5 week. No other symptoms. Reports concussion 1 month ago and stopping her BCP   Subjective    Discussed the use of AI scribe software for clinical note transcription with the patient, who gave verbal consent to proceed.  History of Present Illness   The patient, with a history of anemia and a concussion three months ago, presents with a two-week history of excessive tiredness. She reports feeling drained by midday, despite getting adequate sleep at night. The patient also reports snoring and occasional pauses in breathing during sleep, as observed by her spouse. The patient's concussion was managed with medication and rehabilitation, but she is no longer on the medication due to side effects. The patient also reports a tender spot on the head where she had the concussion.      Review of Systems  Objective    BP 114/82 (BP Location: Left Arm, Patient Position: Sitting, Cuff Size: Normal)   Pulse 82   Ht 5' 5.5 (1.664 m)   Wt 190 lb (86.2 kg)   SpO2 97%   BMI 31.14 kg/m   Physical Exam Vitals reviewed.  Constitutional:      General: She is not in acute distress.    Appearance: Normal appearance. She is well-developed. She is not diaphoretic.  HENT:     Head: Normocephalic and atraumatic.  Eyes:     General: No scleral icterus.    Conjunctiva/sclera: Conjunctivae normal.  Neck:     Thyroid : No thyromegaly.  Cardiovascular:     Rate and Rhythm: Normal rate and regular rhythm.     Heart sounds: Normal heart sounds. No murmur heard. Pulmonary:     Effort: Pulmonary effort is normal. No respiratory distress.     Breath sounds: Normal breath sounds. No wheezing, rhonchi or rales.  Musculoskeletal:     Cervical back: Neck supple.     Right lower leg: No edema.     Left lower  leg: No edema.  Lymphadenopathy:     Cervical: No cervical adenopathy.  Skin:    General: Skin is warm and dry.  Neurological:     Mental Status: She is alert and oriented to person, place, and time. Mental status is at baseline.  Psychiatric:        Mood and Affect: Mood normal.        Behavior: Behavior normal.       No results found for any visits on 07/04/23.  Assessment & Plan     Problem List Items Addressed This Visit   None Visit Diagnoses       Fatigue, unspecified type    -  Primary   Relevant Orders   CBC with Differential/Platelet   Comprehensive metabolic panel   Vitamin B12   VITAMIN D  25 Hydroxy (Vit-D Deficiency, Fractures)   TSH   Iron, TIBC and Ferritin Panel   Ambulatory referral to Sleep Studies     History of anemia       Relevant Orders   CBC with Differential/Platelet   Vitamin B12   Iron, TIBC and Ferritin Panel     Excessive daytime sleepiness       Relevant Orders   Ambulatory referral to Sleep Studies     History of concussion  Excessive Daytime Sleepiness Two weeks of excessive daytime sleepiness despite adequate sleep duration. Differential diagnoses include sleep apnea, anemia, thyroid  dysfunction, and vitamin deficiencies. Reports snoring and pauses in breathing, suggesting sleep apnea. Blood pressure is normal. Discussed home sleep study and potential consequences of untreated sleep apnea. Explained lab panel to check for anemia, thyroid  dysfunction, and vitamin deficiencies. - Order home sleep study - Order lab panel including iron panel, CBC, kidney and liver function, B12, vitamin D , and thyroid   h/o Anemia Anemia could contribute to fatigue. Last checked in November and was well-managed. Anemia can lead to decreased oxygen circulation, contributing to fatigue. - Check anemia status with current lab panel  Concussion Concussion from October 7th with residual scalp tenderness and a ridge at the impact site.  Dizziness and headaches have mostly resolved. Discussed that scalp abnormalities may persist up to six months and involve hypersensitivity due to nerve impact. - Recommend icing the scalp at the end of the day to reduce inflammation  Abnormal Pap Smear Upcoming GYN appointment for biopsy due to abnormal Pap smear.        No orders of the defined types were placed in this encounter.    Return if symptoms worsen or fail to improve.      Jon Eva, MD  [facility] [phone] (phone) [phone] (fax)  [facility]

## 2023-07-05 ENCOUNTER — Other Ambulatory Visit: Payer: Self-pay | Admitting: Obstetrics and Gynecology

## 2023-07-05 LAB — CBC WITH DIFFERENTIAL/PLATELET
Basophils Absolute: 0.1 10*3/uL (ref 0.0–0.2)
Basos: 1 %
EOS (ABSOLUTE): 0.1 10*3/uL (ref 0.0–0.4)
Eos: 1 %
Hematocrit: 41.7 % (ref 34.0–46.6)
Hemoglobin: 13.7 g/dL (ref 11.1–15.9)
Immature Grans (Abs): 0 10*3/uL (ref 0.0–0.1)
Immature Granulocytes: 0 %
Lymphocytes Absolute: 2 10*3/uL (ref 0.7–3.1)
Lymphs: 22 %
MCH: 29.6 pg (ref 26.6–33.0)
MCHC: 32.9 g/dL (ref 31.5–35.7)
MCV: 90 fL (ref 79–97)
Monocytes Absolute: 0.6 10*3/uL (ref 0.1–0.9)
Monocytes: 6 %
Neutrophils Absolute: 6.4 10*3/uL (ref 1.4–7.0)
Neutrophils: 70 %
Platelets: 250 10*3/uL (ref 150–450)
RBC: 4.63 x10E6/uL (ref 3.77–5.28)
RDW: 12 % (ref 11.7–15.4)
WBC: 9.2 10*3/uL (ref 3.4–10.8)

## 2023-07-05 LAB — COMPREHENSIVE METABOLIC PANEL
ALT: 21 [IU]/L (ref 0–32)
AST: 17 [IU]/L (ref 0–40)
Albumin: 4.2 g/dL (ref 3.9–4.9)
Alkaline Phosphatase: 74 [IU]/L (ref 44–121)
BUN/Creatinine Ratio: 16 (ref 9–23)
BUN: 12 mg/dL (ref 6–20)
Bilirubin Total: 0.3 mg/dL (ref 0.0–1.2)
CO2: 22 mmol/L (ref 20–29)
Calcium: 9.7 mg/dL (ref 8.7–10.2)
Chloride: 104 mmol/L (ref 96–106)
Creatinine, Ser: 0.77 mg/dL (ref 0.57–1.00)
Globulin, Total: 2.7 g/dL (ref 1.5–4.5)
Glucose: 87 mg/dL (ref 70–99)
Potassium: 4.2 mmol/L (ref 3.5–5.2)
Sodium: 139 mmol/L (ref 134–144)
Total Protein: 6.9 g/dL (ref 6.0–8.5)
eGFR: 102 mL/min/{1.73_m2} (ref >59)

## 2023-07-05 LAB — VITAMIN D 25 HYDROXY (VIT D DEFICIENCY, FRACTURES): Vit D, 25-Hydroxy: 23.6 ng/mL — ABNORMAL LOW (ref 30.0–100.0)

## 2023-07-05 LAB — TSH: TSH: 1.78 u[IU]/mL (ref 0.450–4.500)

## 2023-07-05 LAB — IRON,TIBC AND FERRITIN PANEL
Ferritin: 110 ng/mL (ref 15–150)
Iron Saturation: 17 % (ref 15–55)
Iron: 65 ug/dL (ref 27–159)
Total Iron Binding Capacity: 372 ug/dL (ref 250–450)
UIBC: 307 ug/dL (ref 131–425)

## 2023-07-05 LAB — VITAMIN B12: Vitamin B-12: 681 pg/mL (ref 232–1245)

## 2023-07-05 NOTE — Patient Instructions (Signed)
 Hi Deborah Lambert, don't forget to get the Vit D-I hope you feel better.  Deborah Lambert was given information about Medicaid Managed Care team care coordination services as a part of their [facility] Community Plan Medicaid benefit. Deborah Lambert verbally consented to engagement with the [facility] Managed Care team.   If you are experiencing a medical emergency, please call 911 or report to your local emergency department or urgent care.   If you have a non-emergency medical problem during routine business hours, please contact your provider's office and ask to speak with a nurse.   For questions related to your [facility], please call: [phone] or visit the homepage here: [email or web address]  If you would like to schedule transportation through your [facility], please call the following number at least 2 days in advance of your appointment: [phone]   Rides for urgent appointments can also be made after hours by calling Member Services.  Call the Behavioral Health Crisis Line at [phone], at any time, 24 hours a day, 7 days a week. If you are in danger or need immediate medical attention call 911.  If you would like help to quit smoking, call 1-800-QUIT-NOW (919-012-1898) OR Espaol: 1-855-Djelo-Ya (8-144-664-6430) o para ms informacin haga clic aqu or Text READY to 799-599 to register via text  Deborah Lambert - following are the goals we discussed in your visit today:   Goals Addressed    Timeframe:  Long-Range Goal Priority:  High Start Date:  02/11/23                           Expected End Date:                       Follow Up Date: 08/06/23   - prevent colds and flu by washing hands, covering coughs and sneezes, getting enough rest - schedule and keep appointment for annual check-up    Why is this important?   Screening tests can find problems  The doctor  or nurse will talk with you about which tests are important.  07/05/23:  PT completed, saw PCP yesterday.  Upcoming GYN appt. Patient verbalizes understanding of instructions and care plan provided today and agrees to view in MyChart. Active MyChart status and patient understanding of how to access instructions and care plan via MyChart confirmed with patient.     The Managed Medicaid care management team will reach out to the patient again over the next 30 business  days.  The  Patient  has been provided with contact information for the Managed Medicaid care management team and has been advised to call with any health related questions or concerns.   Deborah Angles RN, BSN [place]  [facility] Care Management Coordinator - Managed Medicaid High Risk [phone]   Following is a copy of your plan of care:  Care Plan : RN Care Manager Plan of Care  Updates made by Lambert Deborah MATSU, RN since 07/05/2023 12:00 AM     Problem: Health Promotion or Disease Self-Management (General Plan of Care)      Long-Range Goal: Chronic Disease Management and Care Coordination Needs   Start Date: 02/11/2023  Expected End Date: 08/14/2023  Priority: High  Note:   Current Barriers:  Knowledge Deficits related to plan of care for management of Asthma, eczema, obesity, anxiety, PTSD, depression, allergies Care Coordination needs related to Psychologist/Psychiatrist referral  Chronic Disease Management support and education needs related to Asthma, eczema, obesity, anxiety, PTSD, depression, allergies 07/05/23:  patient's head feeling better-completed PT.  C/O fatigue-seen by PCP yesterday-to have home sleep sturdy done.  Vit D deficient-to start Vit D today.  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  Did get list of dental providers-to make an appt.  RNCM Clinical Goal(s):  Patient will verbalize understanding of plan for management of Asthma, eczema, obesity, anxiety, PTSD, depression,  allergies as evidenced by patient report verbalize basic understanding of Asthma, eczema, obesity, anxiety, PTSD, depression, allergies disease process and self health management plan as evidenced by patient report take all medications exactly as prescribed and will call provider for medication related questions as evidenced by patient report demonstrate understanding of rationale for each prescribed medication as evidenced by patient report attend all scheduled medical appointments  as evidenced by patient report and EMR review. Demonstrate ongoing  adherence to prescribed treatment plan for Asthma, eczema, obesity, anxiety, PTSD, depression, allergies  as evidenced by patient report and EMR review. continue to work with RN Care Manager to address care management and care coordination needs related to Asthma, eczema, obesity, anxiety, PTSD, depression, allergies as evidenced by adherence to CM Team Scheduled appointments work with child psychotherapist to address  related to the management of anxiety, depression, PTSD, Bipolar related to the management of Asthma, eczema, obesity, anxiety, PTSD, depression, allergies as evidenced by review of EMR and patient or social worker report through collaboration with Medical Illustrator, provider, and care team.   Interventions: Inter-disciplinary care team collaboration (see longitudinal plan of care) Evaluation of current treatment plan related to  self management and patient's adherence to plan as established by provider Collaborated with LCSW LCSW referral for anxiety, depression, PTSD, BP 05/14/23:  Patient to f/u on NEURO and EYE referrals.   Asthma: (Status:New goal.) Long Term Goal Discussed the importance of adequate rest and management of fatigue with Asthma Assessed social determinant of health barriers   Patient Goals/Self-Care Activities: Take all medications as prescribed Attend all scheduled provider appointments Call pharmacy for medication refills  3-7 days in advance of running out of medications Perform all self care activities independently  Perform IADL's (shopping, preparing meals, housekeeping, managing finances) independently Call provider office for new concerns or questions  Work with the social worker to address care coordination needs and will continue to work with the clinical team to address health care and disease management related needs 02/11/23:  Patient to contact [facility] for dental providers in her area-declined BSW referral-completed. 07/05/23:  patient to start Vit D, complete home sleep study  and schedule dental appt.  Follow Up Plan:  The patient has been provided with contact information for the care management team and has been advised to call with any health related questions or concerns.  The care management team will reach out to the patient again over the next 30 business  days.
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
## 2023-07-08 ENCOUNTER — Encounter: Payer: Medicaid Other | Admitting: Physical Therapy

## 2023-07-11 ENCOUNTER — Encounter: Payer: Medicaid Other | Admitting: Physical Therapy

## 2023-07-13 ENCOUNTER — Telehealth: Payer: 59 | Admitting: Nurse Practitioner

## 2023-07-13 DIAGNOSIS — J069 Acute upper respiratory infection, unspecified: Secondary | ICD-10-CM | POA: Diagnosis not present

## 2023-07-13 MED ORDER — PSEUDOEPH-BROMPHEN-DM 30-2-10 MG/5ML PO SYRP: 5.0000 mL | ORAL_SOLUTION | Freq: Four times a day (QID) | ORAL | 0 refills | Status: DC | PRN

## 2023-07-13 MED ORDER — FLUTICASONE PROPIONATE 50 MCG/ACT NA SUSP: 2.0000 | Freq: Every day | NASAL | 0 refills | Status: DC

## 2023-07-13 NOTE — Progress Notes (Signed)

## 2023-07-13 NOTE — Progress Notes (Signed)
 I have spent 5 minutes in review of e-visit questionnaire, review and updating patient chart, medical decision making and response to patient.   Claiborne Rigg, NP

## 2023-07-15 ENCOUNTER — Ambulatory Visit: Payer: Self-pay | Admitting: *Deleted

## 2023-07-15 ENCOUNTER — Encounter: Payer: Medicaid Other | Admitting: Physical Therapy

## 2023-07-15 NOTE — Telephone Encounter (Signed)
  Chief Complaint: URI symptoms Symptoms: nasal congestion, coughing, has asthma.   Did an E visit on 07/12/2022 but not any better. Frequency: Since Friday Pertinent Negatives: Patient denies getting any better  E visit Disposition: [] ED /[] Urgent Care (no appt availability in office) / [x] Appointment(In office/virtual)/ []  [place] [facility]/ [] Home Care/ [] Refused Recommended Disposition /[] [place] [facility]/ []  Follow-up with PCP Additional Notes: Appt made with Janna Ostwalt, PA-C for 07/16/2023 at 2:40.

## 2023-07-15 NOTE — Telephone Encounter (Signed)
 Message from [place] F sent at 07/15/2023 12:27 PM EST  Summary: Upper respiratory infection medication request   Pt is calling in because she has asthma and an upper respiratory infection and wants to know if she can get some medication called in. Pt is requesting the liquid for a nebulizer.          Call History  Contact Date/Time Type Contact Phone/Fax By  07/15/2023 12:25 PM EST Phone (Incoming) [place], [place] (Self) [phone] (H) Epifanio Grant M   Reason for Disposition  [1] Nasal discharge AND [2] present > 10 days  Answer Assessment - Initial Assessment Questions 1. LOCATION: Where does it hurt?      These URI started Friday.   I woke up Friday morning.  By 5:00 PM I felt awful.    I have asthma.   I need something for my nebulizer. I'm have blood from my nasal congestion.    Coughing.   2. ONSET: When did the sinus pain start?  (e.g., hours, days)      Friday.   3. SEVERITY: How bad is the pain?   (Scale 1-10; mild, moderate or severe)   - MILD (1-3): doesn't interfere with normal activities    - MODERATE (4-7): interferes with normal activities (e.g., work or school) or awakens from sleep   - SEVERE (8-10): excruciating pain and patient unable to do any normal activities        Moderate 4. RECURRENT SYMPTOM: Have you ever had sinus problems before? If Yes, ask: When was the last time? and What happened that time?      Yes 5. NASAL CONGESTION: Is the nose blocked? If Yes, ask: Can you open it or must you breathe through your mouth?     I'm having nasal  congestion 6. NASAL DISCHARGE: Do you have discharge from your nose? If so ask, What color?     Yes 7. FEVER: Do you have a fever? If Yes, ask: What is it, how was it measured, and when did it start?      I'm not sure.   I'm sweating a lot. 8. OTHER SYMPTOMS: Do you have any other symptoms? (e.g., sore throat, cough, earache, difficulty breathing)     I'm wheezing, I've coughed up  a little bloody tinge mucus. 9. PREGNANCY: Is there any chance you are pregnant? When was your last menstrual period?     Not asked  Protocols used: Sinus Pain or Congestion-A-AH

## 2023-07-16 ENCOUNTER — Ambulatory Visit (INDEPENDENT_AMBULATORY_CARE_PROVIDER_SITE_OTHER): Payer: 59 | Admitting: Physician Assistant

## 2023-07-16 VITALS — BP 109/86 | Temp 98.5°F | Ht 65.5 in | Wt 207.0 lb

## 2023-07-16 DIAGNOSIS — U071 COVID-19: Secondary | ICD-10-CM | POA: Diagnosis not present

## 2023-07-16 DIAGNOSIS — R059 Cough, unspecified: Secondary | ICD-10-CM

## 2023-07-16 DIAGNOSIS — R0981 Nasal congestion: Secondary | ICD-10-CM | POA: Diagnosis not present

## 2023-07-16 LAB — POC COVID19 BINAXNOW: SARS Coronavirus 2 Ag: POSITIVE — AB

## 2023-07-16 LAB — POCT INFLUENZA A/B
Influenza A, POC: NEGATIVE
Influenza B, POC: NEGATIVE

## 2023-07-16 MED ORDER — NIRMATRELVIR/RITONAVIR (PAXLOVID)TABLET: 3.0000 | ORAL_TABLET | Freq: Two times a day (BID) | ORAL | 0 refills | Status: AC

## 2023-07-16 NOTE — Progress Notes (Signed)
 Established patient visit  Patient: Deborah Lambert   DOB: Dec 18, 1985   38 y.o. Female  MRN: [id number] Visit Date: 07/16/2023  Today's healthcare provider: Jolynn Spencer, PA-C   Chief Complaint  Patient presents with   Nasal Congestion       Subjective     HPI     Nasal Congestion    Additional comments: Congested, nasal drip, both ear clog, headache, coughing, sweating-5 days.  Had virtual appt prescribe cough medicine, nasal spray.  Requesting nebulizer meds.          07/04/2023    3:03 PM 05/07/2023    4:17 PM 11/12/2022    4:08 PM  Depression screen PHQ 2/9  Decreased Interest 0 0 1  Down, Depressed, Hopeless 0 1 0  PHQ - 2 Score 0 1 1  Altered sleeping 2 1 1   Tired, decreased energy 2 1 1   Change in appetite 0 1 1  Feeling bad or failure about yourself  0 0 1  Trouble concentrating 0 0 0  Moving slowly or fidgety/restless 0 0 0  Suicidal thoughts 0 0 0  PHQ-9 Score 4 4 5   Difficult doing work/chores Somewhat difficult Somewhat difficult Not difficult at all      07/04/2023    3:03 PM 10/31/2022    9:50 AM  GAD 7 : Generalized Anxiety Score  Nervous, Anxious, on Edge 0 1  Control/stop worrying 0 3  Worry too much - different things 0 3  Trouble relaxing 0 3  Restless 0 3  Easily annoyed or irritable 1 3  Afraid - awful might happen 0 0  Total GAD 7 Score 1 16  Anxiety Difficulty Not difficult at all Somewhat difficult    Medications: Outpatient Medications Prior to Visit  Medication Sig   albuterol  (VENTOLIN  HFA) 108 (90 Base) MCG/ACT inhaler Inhale 2 puffs into the lungs every 6 (six) hours as needed for wheezing or shortness of breath. Inhale into the lungs every 6 (six) hours as needed for wheezing or shortness of breath.   BREO ELLIPTA 200-25 MCG/ACT AEPB 1 puff daily.   brompheniramine-pseudoephedrine-DM 30-2-10 MG/5ML syrup Take 5 mLs by mouth 4 (four) times daily as needed.   Crisaborole (EUCRISA) 2 % OINT 1 application Externally Twice a day  for 30 days   fluocinonide  cream (LIDEX ) 0.05 % Apply 1 Application topically 2 (two) times daily.   fluticasone  (FLONASE ) 50 MCG/ACT nasal spray Place 2 sprays into both nostrils daily.   Multiple Vitamin (MULTIVITAMIN) tablet Take 1 tablet by mouth daily.   norethindrone-ethinyl estradiol -FE (JUNEL FE 1/20) 1-20 MG-MCG tablet Take 1 tablet by mouth daily.   ondansetron  (ZOFRAN -ODT) 4 MG disintegrating tablet Take 1 tablet (4 mg total) by mouth every 8 (eight) hours as needed.   triamcinolone  ointment (KENALOG ) 0.5 % Apply 1 Application topically 2 (two) times daily.   No facility-administered medications prior to visit.    Review of Systems All negative Except see HPI       Objective    BP 109/86   Temp 98.5 F (36.9 C)   Ht 5' 5.5 (1.664 m)   Wt 207 lb (93.9 kg)   SpO2 93%   BMI 33.92 kg/m     Physical Exam Constitutional:      General: She is not in acute distress.    Appearance: Normal appearance.  HENT:     Head: Normocephalic.  Pulmonary:     Effort: Pulmonary effort is normal. No respiratory distress.  Neurological:  Mental Status: She is alert and oriented to person, place, and time. Mental status is at baseline.      Results for orders placed or performed in visit on 07/16/23  POC COVID-19  Result Value Ref Range   SARS Coronavirus 2 Ag Positive (A) Negative  POCT Influenza A/B  Result Value Ref Range   Influenza A, POC Negative Negative   Influenza B, POC Negative Negative        Assessment and Plan       COVID-19 - new problem, less than 5 days - discussed symptomatic management, natural course, and return precautions  - discussed masking/isolation - nirmatrelvir /ritonavir  EUA, renal dosing, (PAXLOVID ) 10 x 150 MG & 10 x 100MG  TABS; Take 2 tablets by mouth 2 (two) times daily for 5 days. (Take nirmatrelvir  150 mg one tablet twice daily for 5 days and ritonavir  100 mg one tablet twice daily for 5 days) Patient GFR is 102. STOP atorvastatin  for 10 days.  Dispense: 20 tablet; Refill: 0    Counseled risk/benefits of medications including using birth control. Expected course of illness. Potential rebound symptoms and potential complications and to call if sx worsen or not much better when finished with Paxlovid .   Return if symptoms worsen or fail to improve.      Pt requested nebulizer but unclear what type of nebulizer, there are no nebulizer on her current med list. In the past, albuterol  neb was Rx ed on 2.2.24 by Arvis Huxley. Advised to check with her meds at home and let me know what nebul she needs to be refilled. Orders Placed This Encounter  Procedures   POC COVID-19    Previously tested for COVID-19:   No    Resident in a congregate (group) care setting:   No    Employed in healthcare setting:   No    Pregnant:   No   POCT Influenza A/B    No follow-ups on file.   The patient was advised to call back or seek an in-person evaluation if the symptoms worsen or if the condition fails to improve as anticipated.  I discussed the assessment and treatment plan with the patient. The patient was provided an opportunity to ask questions and all were answered. The patient agreed with the plan and demonstrated an understanding of the instructions.  I, Imanni Burdine, PA-C have reviewed all documentation for this visit. The documentation on 07/16/2023  for the exam, diagnosis, procedures, and orders are all accurate and complete.  Jolynn Spencer, [facility], MMS [facility] [phone] (phone) [phone] (fax)  [facility]

## 2023-07-17 ENCOUNTER — Encounter: Payer: Self-pay | Admitting: Physician Assistant

## 2023-07-17 DIAGNOSIS — U071 COVID-19: Secondary | ICD-10-CM | POA: Insufficient documentation

## 2023-07-18 ENCOUNTER — Encounter: Payer: Self-pay | Admitting: Physician Assistant

## 2023-07-18 ENCOUNTER — Encounter: Payer: Medicaid Other | Admitting: Physical Therapy

## 2023-07-22 ENCOUNTER — Encounter: Payer: Medicaid Other | Admitting: Physical Therapy

## 2023-07-22 ENCOUNTER — Ambulatory Visit: Payer: 59 | Admitting: Student

## 2023-07-23 ENCOUNTER — Encounter: Payer: 59 | Admitting: Obstetrics and Gynecology

## 2023-07-25 ENCOUNTER — Encounter: Payer: Medicaid Other | Admitting: Physical Therapy

## 2023-07-25 ENCOUNTER — Other Ambulatory Visit: Payer: Self-pay | Admitting: Obstetrics and Gynecology

## 2023-07-25 MED ORDER — ALPRAZOLAM 0.5 MG PO TABS: 0.5000 mg | ORAL_TABLET | Freq: Every evening | ORAL | 0 refills | Status: DC | PRN
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
## 2023-07-25 NOTE — Progress Notes (Signed)
Patient requesting pre-procedure anxiolytic medication. Is scheduled for colposcopy on 07/30/23. Short supply of Xanax sent to pharmacy.

## 2023-07-29 ENCOUNTER — Ambulatory Visit (INDEPENDENT_AMBULATORY_CARE_PROVIDER_SITE_OTHER): Payer: 59 | Admitting: Student

## 2023-07-29 ENCOUNTER — Encounter: Payer: Medicaid Other | Admitting: Physical Therapy

## 2023-07-29 VITALS — BP 128/70 | HR 91 | Ht 65.5 in | Wt 195.8 lb

## 2023-07-29 DIAGNOSIS — Z6832 Body mass index (BMI) 32.0-32.9, adult: Secondary | ICD-10-CM | POA: Diagnosis not present

## 2023-07-29 DIAGNOSIS — N62 Hypertrophy of breast: Secondary | ICD-10-CM | POA: Diagnosis not present

## 2023-07-29 DIAGNOSIS — R21 Rash and other nonspecific skin eruption: Secondary | ICD-10-CM

## 2023-07-29 NOTE — Progress Notes (Signed)
   Referring Provider Debera Lat, PA-C [address] [place],  [place] [address]   CC:  Chief Complaint  Patient presents with   Follow-up      Deborah Lambert is an 38 y.o. female.  HPI: Patient is a 38 y.o. year old female here for follow up after completing physical therapy for pain related to macromastia.   She was seen for initial consult by Dr. Ulice Bold on 05/25/2023.  At that time, patient complained of back and neck pain due to her enlarged breasts.  On exam, her STN on the right was 33 cm and her STN on the left was 32 cm.  Her BMI was 33.3 kg/m.  Physical therapy was ordered for the patient.  Patient was found to be a candidate for bilateral breast reduction.  Today, patient reports she is doing well.  She states that she completed at least 6 sessions of physical therapy.  She states that physical therapy greatly helped with some of her symptoms, but still feels that her breasts are very heavy and pulled down on her shoulders and her neck.  She reports that she feels her breasts get in the way of activities and exercise.  She also states that she tends to get rashes underneath her breasts.  Patient denies any recent fevers or chills.  She denies any other changes in her health.  Patient reports that she would like to move forward with a breast reduction.   Review of Systems General: Denies any fevers or chills MSK: Endorses ongoing shoulder and neck discomfort Skin: Reports intermittent rashes  Physical Exam    07/29/2023   12:54 PM 07/16/2023    2:44 PM 07/04/2023    3:01 PM  Vitals with BMI  Height 5' 5.5" 5' 5.5" 5' 5.5"  Weight 195 lbs 13 oz 207 lbs 190 lbs  BMI 32.08 33.91 31.13  Systolic 128 109 914  Diastolic 70 86 82  Pulse 91  82    General:  No acute distress,  Alert and oriented, Non-Toxic, Normal speech and affect Psych: Normal behavior and mood Respiratory: No increased WOB MSK: Ambulatory  Assessment/Plan  Hypertrophy of breast    Patient is interested in pursuing surgical intervention for bilateral breast reduction. Patient has completed at least 6 weeks of physical therapy for pain related to macromastia.  Discussed with patient we would submit to insurance for authorization, discussed approval could take up to 6 weeks.   [name] 07/29/2023, 1:12 PM

## 2023-07-30 ENCOUNTER — Ambulatory Visit (INDEPENDENT_AMBULATORY_CARE_PROVIDER_SITE_OTHER): Payer: 59 | Admitting: Obstetrics and Gynecology

## 2023-07-30 ENCOUNTER — Ambulatory Visit: Payer: 59 | Admitting: Physician Assistant

## 2023-07-30 ENCOUNTER — Encounter: Payer: Self-pay | Admitting: Obstetrics and Gynecology

## 2023-07-30 ENCOUNTER — Other Ambulatory Visit (HOSPITAL_COMMUNITY)
Admission: RE | Admit: 2023-07-30 | Discharge: 2023-07-30 | Disposition: A | Discharge status: Home / Self Care | Payer: 59 | Source: Ambulatory Visit | Attending: Obstetrics and Gynecology | Admitting: Obstetrics and Gynecology

## 2023-07-30 VITALS — BP 102/73 | HR 79 | Resp 16 | Ht 65.5 in | Wt 192.0 lb

## 2023-07-30 DIAGNOSIS — R87613 High grade squamous intraepithelial lesion on cytologic smear of cervix (HGSIL): Secondary | ICD-10-CM | POA: Insufficient documentation

## 2023-07-30 DIAGNOSIS — D069 Carcinoma in situ of cervix, unspecified: Secondary | ICD-10-CM | POA: Diagnosis not present

## 2023-07-30 DIAGNOSIS — Z3202 Encounter for pregnancy test, result negative: Secondary | ICD-10-CM

## 2023-07-30 DIAGNOSIS — R8789 Other abnormal findings in specimens from female genital organs: Secondary | ICD-10-CM | POA: Insufficient documentation

## 2023-07-30 LAB — POCT URINE PREGNANCY: Preg Test, Ur: NEGATIVE

## 2023-07-30 NOTE — Patient Instructions (Addendum)
Colposcopy  Colposcopy is a procedure to examine the lowest part of the uterus (cervix) for abnormalities or signs of disease. This procedure is done using an instrument that makes objects appear larger and provides light (colposcope). During the procedure, your health care provider may remove a tissue sample to look at under a microscope (biopsy). A biopsy may be done if any unusual cells are seen during the colposcopy. You may have a colposcopy if you have: An abnormal Pap smear, also called a Pap test. This screening test is used to check for signs of cancer or infection of the vagina, cervix, and uterus. An HPV (human papillomavirus) test and get a positive result for a type of HPV that puts you at high risk of cancer. Certain conditions or symptoms, such as: A sore, or lesion, on your cervix. Genital warts on your vulva, vagina, or cervix. Pain during sex. Vaginal bleeding, especially after sex. A growth on your cervix (cervical polyp) that needs to be removed. Let your health care provider know about: Any allergies you have, including allergies to medicines, latex, or iodine. All medicines you are taking, including vitamins, herbs, eye drops, creams, and over-the-counter medicines. Any bleeding problems you have. Any surgeries you have had. Any medical conditions you have, such as pelvic inflammatory disease (PID) or an endometrial disorder. The pattern of your menstrual cycles and the form of birth control (contraception) you use, if any. Your medical history, including any cervical treatments and how well you tolerated the procedure (if you have ever fainted). Whether you are pregnant or may be pregnant. What are the risks? Generally, this is a safe procedure. However, problems may occur, including: Infection. Symptoms of infection may include fever, bad-smelling vaginal discharge, or pelvic pain. Vaginal bleeding. Allergic reactions to medicines. Damage to nearby structures or  organs. What happens before the procedure? Medicines Ask your health care provider about: Changing or stopping your regular medicines. This is especially important if you are taking diabetes medicines or blood thinners. Taking medicines such as aspirin and ibuprofen. These medicines can thin your blood. Do not take these medicines unless your health care provider tells you to take them. Your health care provider will likely tell you to avoid taking aspirin, or medicine that contains aspirin, for 7 days before the procedure. Taking over-the-counter medicines, vitamins, herbs, and supplements. General instructions Tell your health care provider if you have your menstrual period now or will have it at the time of your procedure. A colposcopy is not normally done during your menstrual period. If you use contraception, continue to use it before your procedure. For 24 hours before the procedure: Do not use douche products or tampons. Do not use medicines, creams, or suppositories in the vagina. Do not have sex or insert anything into your vagina. Ask your health care provider what steps will be taken to prevent infection. What happens during the procedure? You will lie down on your back, with your feet in foot rests (stirrups). An instrument called a speculum will be inserted into your vagina. This will be used so your health care provider can see your cervix and the inside of your vagina. A cotton swab will be used to place a small amount of a liquid (solution) on the areas to be examined. This solution makes it easier to see abnormal cells. You may feel a slight burning during this part. The colposcope will be used to scan the cervix with a bright white light. The colposcope will be held near  your vulva and will make your vulva, vagina, and cervix look bigger so they can be seen better. If a biopsy is needed: You may be given a medicine to numb the area (local anesthetic). Surgical tools will be  used to remove mucus and cells through your vagina. You may feel mild pain while the tissue sample is removed. Bleeding may occur. A solution may be used to stop the bleeding. The tissue removed will be sent to a lab to be looked at under a microscope. The procedure may vary among health care providers and hospitals. What happens after the procedure? You may have some cramping in your abdomen. This should go away after a few minutes. It is up to you to get the results of your procedure. Ask your health care provider, or the department that is doing the procedure, when your results will be ready. Summary Colposcopy is a procedure to examine the lowest part of the uterus (cervix), for signs of disease. A biopsy may be done as part of the procedure. You may have some cramping in your abdomen. This should go away after a few minutes. It is up to you to get the results of your procedure. Ask your health care provider, or the department that is doing the procedure, when your results will be ready. This information is not intended to replace advice given to you by your health care provider. Make sure you discuss any questions you have with your health care provider. Document Revised: 11/13/2020 Document Reviewed: 11/13/2020 Elsevier Patient Education  2024 [employer]   Colposcopy, Care After  The following information offers guidance on how to care for yourself after your procedure. Your health care provider may also give you more specific instructions. If you have problems or questions, contact your health care provider. What can I expect after the procedure? If you had a colposcopy without a biopsy, you can expect to feel fine right away after your procedure. However, you may have some spotting of blood for a few days. You can return to your normal activities. If you had a colposcopy with a biopsy, it is common after the procedure to have: Soreness and mild pain. These may last for a few  days. Mild vaginal bleeding or discharge that is dark-colored and grainy. This may last for a few days. The discharge may be caused by a liquid (solution) that was used during the procedure. You may need to wear a sanitary pad during this time. Spotting of blood for at least 48 hours after the procedure. Follow these instructions at home: Medicines Take over-the-counter and prescription medicines only as told by your health care provider. Talk with your health care provider about what type of over-the-counter pain medicines and prescription medicines you can start to take again. It is especially important to talk with your health care provider if you take blood thinners. Activity Avoid using douche products, using tampons, and having sex for at least 3 days after the procedure or for as long as told by your health care provider. Return to your normal activities as told by your health care provider. Ask your health care provider what activities are safe for you. General instructions Ask your health care provider if you may take baths, swim, or use a hot tub. You may take showers. If you use birth control (contraception), continue to use it. Keep all follow-up visits. This is important. Contact a health care provider if: You have a fever or chills. You faint or feel  light-headed. Get help right away if: You have heavy bleeding from your vagina or pass blood clots. Heavy bleeding is bleeding that soaks through a sanitary pad in less than 1 hour. You have vaginal discharge that is abnormal, is yellow in color, or smells bad. This could be a sign of infection. You have severe pain or cramps in your lower abdomen that do not go away with medicine. Summary If you had a colposcopy without a biopsy, you can expect to feel fine right away, but you may have some spotting of blood for a few days. You can return to your normal activities. If you had a colposcopy with a biopsy, it is common to have mild  pain for a few days and spotting for 48 hours after the procedure. Avoid using douche products, using tampons, and having sex for at least 3 days after the procedure or for as long as told by your health care provider. Get help right away if you have heavy bleeding, severe pain, or signs of infection. This information is not intended to replace advice given to you by your health care provider. Make sure you discuss any questions you have with your health care provider. Document Revised: 11/13/2020 Document Reviewed: 11/13/2020 Elsevier Patient Education  2024 [employer].

## 2023-07-31 ENCOUNTER — Encounter: Payer: Self-pay | Admitting: Obstetrics and Gynecology

## 2023-07-31 LAB — SURGICAL PATHOLOGY

## 2023-08-01 ENCOUNTER — Encounter: Payer: Medicaid Other | Admitting: Physical Therapy

## 2023-08-01 NOTE — Telephone Encounter (Signed)
Ok, thank you

## 2023-08-05 ENCOUNTER — Encounter: Payer: Medicaid Other | Admitting: Physical Therapy

## 2023-08-06 ENCOUNTER — Other Ambulatory Visit: Payer: Self-pay | Admitting: Obstetrics and Gynecology

## 2023-08-06 NOTE — Patient Outreach (Signed)
  Medicaid Managed Care   Unsuccessful Attempt Note   08/06/2023 Name: Deborah Lambert MRN: [id number] DOB: 22-Aug-1985  Referred by: Ostwalt, Janna, PA-C Reason for referral : High Risk Managed Medicaid (Chronic case management follow up)  An unsuccessful telephone outreach was attempted today. The patient was referred to the case management team for assistance with care management and care coordination.    Follow Up Plan: The Managed Medicaid care management team will reach out to the patient again over the next 30 business  days. and The  Patient has been provided with contact information for the Managed Medicaid care management team and has been advised to call with any health related questions or concerns.   Veva Angles RN, BSN, [employer] [facility] RN Care Manager Direct Dial [phone] [phone] Website: [email or web address]

## 2023-08-06 NOTE — Patient Instructions (Signed)
 Hi [name], I am sorry I didn't reach you this morning- as a part of your Medicaid benefit, you are eligible for care management and care coordination services at no cost or copay. I was unable to reach you by phone today but would be happy to help you with your health related needs. Please feel free to call me at [phone]  A member of the Managed Medicaid care management team will reach out to you again over the next 30 business  days.   Veva Angles RN, BSN, [employer] [facility] RN Care Manager Direct Dial [phone]/Qjk [phone] Website: [email or web address]

## 2023-08-08 ENCOUNTER — Other Ambulatory Visit: Payer: Self-pay | Admitting: Obstetrics and Gynecology

## 2023-08-08 ENCOUNTER — Encounter: Payer: Medicaid Other | Admitting: Physical Therapy

## 2023-08-08 NOTE — Patient Instructions (Signed)
 Hi Ms. Mckeen, thank you for all of the updates this afternoon-I hope you have a good rest of the afternoon!  Ms. Burmaster was given information about Medicaid Managed Care team care coordination services as a part of their [facility] Community [facility] Medicaid benefit. Delon JAYSON Patrica verbally consented to engagement with the [facility] Managed Care team.   If you are experiencing a medical emergency, please call 911 or report to your local emergency department or urgent care.   If you have a non-emergency medical problem during routine business hours, please contact your provider's office and ask to speak with a nurse.   For questions related to your [facility], please call: [phone] or visit the homepage here: [email or web address]  If you would like to schedule transportation through your [facility], please call the following number at least 2 days in advance of your appointment: [phone]   Rides for urgent appointments can also be made after hours by calling Member Services.  Call the Behavioral Health Crisis Line at [phone], at any time, 24 hours a day, 7 days a week. If you are in danger or need immediate medical attention call 911.  If you would like help to quit smoking, call 1-800-QUIT-NOW ((725) 435-9613) OR Espaol: 1-855-Djelo-Ya (8-144-664-6430) o para ms informacin haga clic aqu or Text READY to 799-599 to register via text  Ms. Arakelian - following are the goals we discussed in your visit today:   Goals Addressed    Timeframe:  Long-Range Goal Priority:  High Start Date:  02/11/23                           Expected End Date:                       Follow Up Date: 09/05/23   - prevent colds and flu by washing hands, covering coughs and sneezes, getting enough rest - schedule and keep appointment for annual check-up    Why is this important?    Screening tests can find problems  The doctor or nurse will talk with you about which tests are important.  08/08/23:  Upcoming PCP and GYN appt, to r/s DERM appt d/t surgery.    Patient verbalizes understanding of instructions and care plan provided today and agrees to view in MyChart. Active MyChart status and patient understanding of how to access instructions and care plan via MyChart confirmed with patient.     The Managed Medicaid care management team will reach out to the patient again over the next 30 business  days.  The  Patient  has been provided with contact information for the Managed Medicaid care management team and has been advised to call with any health related questions or concerns.     Following is a copy of your plan of care:  Care Plan : RN Care Manager Plan of Care  Updates made by Milon Veva MATSU, RN since 08/08/2023 12:00 AM     Problem: Health Promotion or Disease Self-Management (General Plan of Care)      Long-Range Goal: Chronic Disease Management and Care Coordination Needs   Start Date: 02/11/2023  Expected End Date: 11/05/2023  Priority: High  Note:   Current Barriers:  Knowledge Deficits related to plan of care for management of Asthma, eczema, obesity, anxiety, PTSD, depression, allergies Care Coordination needs related to Psychologist/Psychiatrist referral Chronic Disease Management support and education needs  related to Asthma, eczema, obesity, anxiety, PTSD, depression, allergies 08/08/23:  patient to have hysterectomy 3/3.  Fatigue remains-no noticeable change with Vit D-has f/u with PCP 2/10.  Home sleep study negative.  COVID 1/14-feeling better now.    RNCM Clinical Goal(s):  Patient will verbalize understanding of plan for management of Asthma, eczema, obesity, anxiety, PTSD, depression, allergies as evidenced by patient report verbalize basic understanding of Asthma, eczema, obesity, anxiety, PTSD, depression, allergies disease process and self  health management plan as evidenced by patient report take all medications exactly as prescribed and will call provider for medication related questions as evidenced by patient report demonstrate understanding of rationale for each prescribed medication as evidenced by patient report attend all scheduled medical appointments  as evidenced by patient report and EMR review. Demonstrate ongoing  adherence to prescribed treatment plan for Asthma, eczema, obesity, anxiety, PTSD, depression, allergies  as evidenced by patient report and EMR review. continue to work with RN Care Manager to address care management and care coordination needs related to Asthma, eczema, obesity, anxiety, PTSD, depression, allergies as evidenced by adherence to CM Team Scheduled appointments work with child psychotherapist to address  related to the management of anxiety, depression, PTSD, Bipolar related to the management of Asthma, eczema, obesity, anxiety, PTSD, depression, allergies as evidenced by review of EMR and patient or social worker report through collaboration with Medical Illustrator, provider, and care team.   Interventions: Inter-disciplinary care team collaboration (see longitudinal plan of care) Evaluation of current treatment plan related to  self management and patient's adherence to plan as established by provider Collaborated with LCSW LCSW referral for anxiety, depression, PTSD, BP 05/14/23:  Patient to f/u on NEURO and EYE referrals.   Asthma: (Status:New goal.) Long Term Goal Discussed the importance of adequate rest and management of fatigue with Asthma Assessed social determinant of health barriers   Patient Goals/Self-Care Activities: Take all medications as prescribed Attend all scheduled provider appointments Call pharmacy for medication refills 3-7 days in advance of running out of medications Perform all self care activities independently  Perform IADL's (shopping, preparing meals, housekeeping,  managing finances) independently Call provider office for new concerns or questions  Work with the social worker to address care coordination needs and will continue to work with the clinical team to address health care and disease management related needs 02/11/23:  Patient to contact [facility] for dental providers in her area-declined BSW referral-completed. 07/05/23:  patient to start Vit D, complete home sleep study  and schedule dental appt.  Follow Up Plan:  The patient has been provided with contact information for the care management team and has been advised to call with any health related questions or concerns.  The care management team will reach out to the patient again over the next 30 business  days.

## 2023-08-08 NOTE — Patient Outreach (Signed)
 Medicaid Managed Care   Nurse Care Manager Note  08/08/2023 Name:  Deborah Lambert MRN:  [id number] DOB:  1985/11/05  Deborah Lambert is an 38 y.o. year old female who is a primary patient of Ostwalt, Janna, PA-C.  The [facility] Managed Care Coordination team was consulted for assistance with:    Chronic healthcare management needs, allergies, asthma, eczema, dermatitis, fatigue, h/o anemia, anziety/depression/PTSD/BP  [name] was given information about Medicaid Managed Care Coordination team services today. Deborah Lambert Patient agreed to services and verbal consent obtained.  Engaged with patient by telephone for follow up visit in response to provider referral for case management and/or care coordination services.   Patient is participating in a Managed Medicaid Plan:  Yes  Assessments/Interventions:  Review of past medical history, allergies, medications, health status, including review of consultants reports, laboratory and other test data, was performed as part of comprehensive evaluation and provision of chronic care management services.  SDOH (Social Drivers of Health) assessments and interventions performed: SDOH Interventions    Flowsheet Row Patient Outreach Telephone from 08/08/2023 in [place] [facility] Patient Outreach Telephone from 07/05/2023 in [place] POPULATION HEALTH DEPARTMENT Patient Outreach Telephone from 05/14/2023 in [place] POPULATION HEALTH DEPARTMENT Patient Outreach Telephone from 04/05/2023 in [place] POPULATION HEALTH DEPARTMENT Patient Outreach Telephone from 03/21/2023 in [place] POPULATION HEALTH DEPARTMENT Patient Outreach Telephone from 03/14/2023 in [place] POPULATION HEALTH DEPARTMENT  SDOH Interventions        Food Insecurity Interventions -- -- -- -- -- Intervention Not Indicated  Housing Interventions -- -- -- -- -- Intervention Not Indicated  Utilities Interventions -- Intervention Not Indicated -- --  -- --  Alcohol Usage Interventions Intervention Not Indicated (Score <7) -- Intervention Not Indicated (Score <7) -- -- --  Stress Interventions -- -- -- [employer], Provide Counseling  [Is in the process of preparing for relocation] [employer], Museum/gallery Conservator from being sick] --  Health Literacy Interventions Intervention Not Indicated -- Intervention Not Indicated -- -- --     Care Plan Allergies  Allergen Reactions   Aspirin Shortness Of Breath and Rash   Codeine Shortness Of Breath and Rash   Aspirin Hives   Codeine Hives   Gold Sodium Thiomalate    Almond Oil Rash   Coconut (Cocos Nucifera) Rash   Medications Reviewed Today     Reviewed by Milon Veva MATSU, RN (Registered Nurse) on 08/08/23 at 1335  Med List Status: <None>   Medication Order Taking? Sig Documenting Provider Last Dose Status Informant  albuterol  (VENTOLIN  HFA) 108 (90 Base) MCG/ACT inhaler [id number]  Inhale 2 puffs into the lungs every 6 (six) hours as needed for wheezing or shortness of breath. Inhale into the lungs every 6 (six) hours as needed for wheezing or shortness of breath. Ostwalt, Janna, PA-C  Active   ALPRAZolam  (XANAX ) 0.5 MG tablet [id number]  Take 1 tablet (0.5 mg total) by mouth at bedtime as needed for anxiety. Take 1 tablet 30 min prior to scheduled procedure. Take additional dose at time of procedure if needed. Connell Davies, MD  Active   BREO ELLIPTA 200-25 MCG/ACT AEPB [id number]  1 puff daily. [provider]  Active   brompheniramine-pseudoephedrine-DM 30-2-10 MG/5ML syrup [id number]  Take 5 mLs by mouth 4 (four) times daily as needed. Theotis Haze ORN, NP  Active   cholecalciferol (VITAMIN D3) 25 MCG (1000 UNIT) tablet [id number] Yes Take 1,000 Units by mouth  daily. [provider]  Active Self  Crisaborole (EUCRISA) 2 % OINT [id number]  1 application Externally Twice a day for 30 days [provider]   Active   fluocinonide  cream (LIDEX ) 0.05 % [id number]  Apply 1 Application topically 2 (two) times daily. Ostwalt, Janna, PA-C  Active   fluticasone  (FLONASE ) 50 MCG/ACT nasal spray [id number]  Place 2 sprays into both nostrils daily. Theotis Haze ORN, NP  Active   Multiple Vitamin (MULTIVITAMIN) tablet [id number]  Take 1 tablet by mouth daily. [provider]  Active Self  norethindrone-ethinyl estradiol -FE (JUNEL FE 1/20) 1-20 MG-MCG tablet [id number]  Take 1 tablet by mouth daily.  Patient not taking: Reported on 07/29/2023   Justino Eleanor HERO, CNM  Active            Med Note SHEPPARD, COLORADO S   Thu Jul 04, 2023  3:02 PM) Stopped 2 weeks ago  ondansetron  (ZOFRAN -ODT) 4 MG disintegrating tablet [id number]  Take 1 tablet (4 mg total) by mouth every 8 (eight) hours as needed. Claudene Rover, MD  Active   triamcinolone  ointment (KENALOG ) 0.5 % [id number]  Apply 1 Application topically 2 (two) times daily. Cook, Jayce G, DO  Active            Patient Active Problem List   Diagnosis Date Noted   COVID-19 07/17/2023   History of trauma 06/14/2023   Well woman exam with routine gynecological exam 06/14/2023   Atopic dermatitis 05/24/2023   Moderate persistent asthma, uncomplicated 05/24/2023   Acute stress reaction 05/13/2023   Postconcussion syndrome 05/08/2023   At high risk for breast cancer 04/26/2023   Family history of breast cancer gene mutation in first degree relative 04/26/2023   Genetic testing 04/16/2023   Generalized edema 11/12/2022   Dizziness 11/12/2022   Encounter for screening for HIV 11/12/2022   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/12/2022   Encounter for hepatitis C screening test for low risk patient 11/12/2022   Obesity (BMI 30-39.9) 10/31/2022   Anxiety 10/31/2022   Uncomplicated asthma 10/31/2022   Eczema of both hands 10/31/2022   PTSD (post-traumatic stress disorder) 10/31/2022   Encounter for medical examination to establish care 10/31/2022    Seasonal allergies 10/31/2022   Depression, recurrent (HCC) 10/31/2022   Conditions to be addressed/monitored per PCP order:  Chronic healthcare management needs, allergies, asthma, eczema, dermatitis, fatigue, h/o anemia, anziety/depression/PTSD/BP  Care Plan : RN Care Manager Plan of Care  Updates made by Milon Veva MATSU, RN since 08/08/2023 12:00 AM     Problem: Health Promotion or Disease Self-Management (General Plan of Care)      Long-Range Goal: Chronic Disease Management and Care Coordination Needs   Start Date: 02/11/2023  Expected End Date: 11/05/2023  Priority: High  Note:   Current Barriers:  Knowledge Deficits related to plan of care for management of Asthma, eczema, obesity, anxiety, PTSD, depression, allergies Care Coordination needs related to Psychologist/Psychiatrist referral Chronic Disease Management support and education needs related to Asthma, eczema, obesity, anxiety, PTSD, depression, allergies 08/08/23:  patient to have hysterectomy 3/3.  Fatigue remains-no noticeable change with Vit D-has f/u with PCP 2/10.  Home sleep study negative.  COVID 1/14-feeling better now.    RNCM Clinical Goal(s):  Patient will verbalize understanding of plan for management of Asthma, eczema, obesity, anxiety, PTSD, depression, allergies as evidenced by patient report verbalize basic understanding of Asthma, eczema, obesity, anxiety, PTSD, depression, allergies disease process and self health management plan as evidenced by patient report take  all medications exactly as prescribed and will call provider for medication related questions as evidenced by patient report demonstrate understanding of rationale for each prescribed medication as evidenced by patient report attend all scheduled medical appointments  as evidenced by patient report and EMR review. Demonstrate ongoing  adherence to prescribed treatment plan for Asthma, eczema, obesity, anxiety, PTSD, depression, allergies  as evidenced  by patient report and EMR review. continue to work with RN Care Manager to address care management and care coordination needs related to Asthma, eczema, obesity, anxiety, PTSD, depression, allergies as evidenced by adherence to CM Team Scheduled appointments work with child psychotherapist to address  related to the management of anxiety, depression, PTSD, Bipolar related to the management of Asthma, eczema, obesity, anxiety, PTSD, depression, allergies as evidenced by review of EMR and patient or social worker report through collaboration with Medical Illustrator, provider, and care team.   Interventions: Inter-disciplinary care team collaboration (see longitudinal plan of care) Evaluation of current treatment plan related to  self management and patient's adherence to plan as established by provider Collaborated with LCSW LCSW referral for anxiety, depression, PTSD, BP 05/14/23:  Patient to f/u on NEURO and EYE referrals.   Asthma: (Status:New goal.) Long Term Goal Discussed the importance of adequate rest and management of fatigue with Asthma Assessed social determinant of health barriers   Patient Goals/Self-Care Activities: Take all medications as prescribed Attend all scheduled provider appointments Call pharmacy for medication refills 3-7 days in advance of running out of medications Perform all self care activities independently  Perform IADL's (shopping, preparing meals, housekeeping, managing finances) independently Call provider office for new concerns or questions  Work with the social worker to address care coordination needs and will continue to work with the clinical team to address health care and disease management related needs 02/11/23:  Patient to contact [facility] for dental providers in her area-declined BSW referral-completed. 07/05/23:  patient to start Vit D, complete home sleep study  and schedule dental appt.  Follow Up Plan:  The patient has been provided with contact information  for the care management team and has been advised to call with any health related questions or concerns.  The care management team will reach out to the patient again over the next 30 business  days.   Long-Range Goal: Establish Plan of Care for Chronic Disease Management and Care Coordination Needs   Priority: High  Note:   Timeframe:  Long-Range Goal Priority:  High Start Date:  02/11/23                           Expected End Date:                       Follow Up Date: 09/05/23   - prevent colds and flu by washing hands, covering coughs and sneezes, getting enough rest - schedule and keep appointment for annual check-up    Why is this important?   Screening tests can find problems  The doctor or nurse will talk with you about which tests are important.  08/08/23:  Upcoming PCP and GYN appt, to r/s DERM appt d/t surgery.     Follow Up:  Patient agrees to Care Plan and Follow-up.  Plan: The Managed Medicaid care management team will reach out to the patient again over the next 30 business  days. and The  Patient has been provided with contact information for the Managed  Medicaid care management team and has been advised to call with any health related questions or concerns.  Date/time of next scheduled RN care management/care coordination outreach:  09/05/23 at 1230

## 2023-08-11 NOTE — Progress Notes (Deleted)
 Established patient visit  Patient: Deborah Lambert   DOB: 1986/03/16   38 y.o. Female  MRN: [id number] Visit Date: 08/12/2023  Today's healthcare provider: Debera Lat, PA-C   No chief complaint on file.  Subjective       Discussed the use of AI scribe software for clinical note transcription with the patient, who gave verbal consent to proceed.  History of Present Illness               07/04/2023    3:03 PM 05/07/2023    4:17 PM 11/12/2022    4:08 PM  Depression screen PHQ 2/9  Decreased Interest 0 0 1  Down, Depressed, Hopeless 0 1 0  PHQ - 2 Score 0 1 1  Altered sleeping 2 1 1   Tired, decreased energy 2 1 1   Change in appetite 0 1 1  Feeling bad or failure about yourself  0 0 1  Trouble concentrating 0 0 0  Moving slowly or fidgety/restless 0 0 0  Suicidal thoughts 0 0 0  PHQ-9 Score 4 4 5   Difficult doing work/chores Somewhat difficult Somewhat difficult Not difficult at all      07/04/2023    3:03 PM 10/31/2022    9:50 AM  GAD 7 : Generalized Anxiety Score  Nervous, Anxious, on Edge 0 1  Control/stop worrying 0 3  Worry too much - different things 0 3  Trouble relaxing 0 3  Restless 0 3  Easily annoyed or irritable 1 3  Afraid - awful might happen 0 0  Total GAD 7 Score 1 16  Anxiety Difficulty Not difficult at all Somewhat difficult    Medications: Outpatient Medications Prior to Visit  Medication Sig  . albuterol (VENTOLIN HFA) 108 (90 Base) MCG/ACT inhaler Inhale 2 puffs into the lungs every 6 (six) hours as needed for wheezing or shortness of breath. Inhale into the lungs every 6 (six) hours as needed for wheezing or shortness of breath.  . ALPRAZolam (XANAX) 0.5 MG tablet Take 1 tablet (0.5 mg total) by mouth at bedtime as needed for anxiety. Take 1 tablet 30 min prior to scheduled procedure. Take additional dose at time of procedure if needed.  Marland Kitchen BREO ELLIPTA 200-25 MCG/ACT AEPB 1 puff daily.  . brompheniramine-pseudoephedrine-DM 30-2-10 MG/5ML  syrup Take 5 mLs by mouth 4 (four) times daily as needed.  . cholecalciferol (VITAMIN D3) 25 MCG (1000 UNIT) tablet Take 1,000 Units by mouth daily.  Lennox Solders (EUCRISA) 2 % OINT 1 application Externally Twice a day for 30 days  . fluocinonide cream (LIDEX) 0.05 % Apply 1 Application topically 2 (two) times daily.  . fluticasone (FLONASE) 50 MCG/ACT nasal spray Place 2 sprays into both nostrils daily.  . Multiple Vitamin (MULTIVITAMIN) tablet Take 1 tablet by mouth daily.  . norethindrone-ethinyl estradiol-FE (JUNEL FE 1/20) 1-20 MG-MCG tablet Take 1 tablet by mouth daily. (Patient not taking: Reported on 07/29/2023)  . ondansetron (ZOFRAN-ODT) 4 MG disintegrating tablet Take 1 tablet (4 mg total) by mouth every 8 (eight) hours as needed.  . triamcinolone ointment (KENALOG) 0.5 % Apply 1 Application topically 2 (two) times daily.   No facility-administered medications prior to visit.    Review of Systems  All other systems reviewed and are negative. All negative Except see HPI   {Insert previous labs (optional):23779} {See past labs  Heme  Chem  Endocrine  Serology  Results Review (optional):1}   Objective    LMP 07/19/2023  {Insert last BP/Wt (optional):23777}{See vitals  history (optional):1}   Physical Exam Vitals reviewed.  Constitutional:      General: She is not in acute distress.    Appearance: Normal appearance. She is well-developed. She is not diaphoretic.  HENT:     Head: Normocephalic and atraumatic.  Eyes:     General: No scleral icterus.    Conjunctiva/sclera: Conjunctivae normal.  Neck:     Thyroid: No thyromegaly.  Cardiovascular:     Rate and Rhythm: Normal rate and regular rhythm.     Pulses: Normal pulses.     Heart sounds: Normal heart sounds. No murmur heard. Pulmonary:     Effort: Pulmonary effort is normal. No respiratory distress.     Breath sounds: Normal breath sounds. No wheezing, rhonchi or rales.  Musculoskeletal:     Cervical back:  Neck supple.     Right lower leg: No edema.     Left lower leg: No edema.  Lymphadenopathy:     Cervical: No cervical adenopathy.  Skin:    General: Skin is warm and dry.     Findings: No rash.  Neurological:     Mental Status: She is alert and oriented to person, place, and time. Mental status is at baseline.  Psychiatric:        Mood and Affect: Mood normal.        Behavior: Behavior normal.     No results found for any visits on 08/12/23.      Assessment and Plan             No orders of the defined types were placed in this encounter.   No follow-ups on file.   The patient was advised to call back or seek an in-person evaluation if the symptoms worsen or if the condition fails to improve as anticipated.  I discussed the assessment and treatment plan with the patient. The patient was provided an opportunity to ask questions and all were answered. The patient agreed with the plan and demonstrated an understanding of the instructions.  I, Debera Lat, PA-C have reviewed all documentation for this visit. The documentation on 08/12/2023  for the exam, diagnosis, procedures, and orders are all accurate and complete.  Debera Lat, [facility], MMS [facility] [phone] (phone) [phone] (fax)  [facility]

## 2023-08-12 ENCOUNTER — Ambulatory Visit: Payer: Self-pay | Admitting: Physician Assistant

## 2023-08-12 DIAGNOSIS — R5383 Other fatigue: Secondary | ICD-10-CM

## 2023-08-12 DIAGNOSIS — F419 Anxiety disorder, unspecified: Secondary | ICD-10-CM

## 2023-08-12 DIAGNOSIS — E669 Obesity, unspecified: Secondary | ICD-10-CM

## 2023-08-12 DIAGNOSIS — F4310 Post-traumatic stress disorder, unspecified: Secondary | ICD-10-CM

## 2023-08-12 DIAGNOSIS — G4719 Other hypersomnia: Secondary | ICD-10-CM

## 2023-08-12 DIAGNOSIS — Z862 Personal history of diseases of the blood and blood-forming organs and certain disorders involving the immune mechanism: Secondary | ICD-10-CM

## 2023-08-12 DIAGNOSIS — F0781 Postconcussional syndrome: Secondary | ICD-10-CM

## 2023-08-14 ENCOUNTER — Other Ambulatory Visit: Payer: Self-pay | Admitting: Obstetrics and Gynecology

## 2023-08-14 ENCOUNTER — Telehealth: Payer: Self-pay | Admitting: Obstetrics and Gynecology

## 2023-08-14 NOTE — Telephone Encounter (Signed)
This is fine

## 2023-08-14 NOTE — Telephone Encounter (Signed)
Patient states she is concerned and that her cycle began early after a procedure that begins with a "C". (Colpo?) Patient would like to know if this is normal and/or if she should be concerned. Please advise.

## 2023-08-20 NOTE — Progress Notes (Deleted)
 @[facility] @   GYNECOLOGY PREOPERATIVE HISTORY AND PHYSICAL   Subjective:  Deborah Lambert is a 38 y.o. [phone] here for surgical management of high-grade squamous intraepithelial neoplasia  (HGSIL-encompassing moderate and severe dysplasia). No significant preoperative concerns.  Proposed surgery: LAPAROSCOPIC ASSISTED VAGINAL HYSTERECTOMY WITH BILATERAL SALPINGECTOMY     Pertinent Gynecological History: Menses: {menses:16152} Bleeding: {uterine bleeding:32112} Contraception: {contraception:5051} Last mammogram: normal Date: 04/04/2023 Last pap: abnormal:    Date: 06/14/2023   Past Medical History:  Diagnosis Date   Anxiety    Bell's palsy    Cluster headaches    Focal seizures (HCC)    PTSD (post-traumatic stress disorder)    Seizures (HCC)    Past Surgical History:  Procedure Laterality Date   right arm surgery     OB History  Gravida Para Term Preterm AB Living  2 2 2   2   SAB IAB Ectopic Multiple Live Births      2    # Outcome Date GA Lbr Len/2nd Weight Sex Type Anes PTL Lv  2 Term 06/29/08   8 lb 3.2 oz (3.719 kg) M  None  LIV  1 Term 11/27/03   7 lb 12.8 oz (3.538 kg) M    LIV  Patient denies any other pertinent gynecologic issues.  Family History  Problem Relation Age of Onset   Stroke Mother    Breast cancer Mother        dx 80s, no genetic testing   Diabetes Father    Throat cancer Paternal Uncle    Breast cancer Maternal Grandmother        dx under 50   Melanoma Maternal Grandfather        metastatic, d. 42s   Breast cancer Paternal Grandmother        dx 34s   Lung cancer Paternal Grandfather    Social History   Socioeconomic History   Marital status: Married    Spouse name: Not on file   Number of children: Not on file   Years of education: Not on file   Highest education level: 11th grade  Occupational History   Not on file  Tobacco Use   Smoking status: Never   Smokeless tobacco: Never  Vaping Use   Vaping status: Never Used   Substance and Sexual Activity   Alcohol use: Never   Drug use: Never   Sexual activity: Not Currently    Birth control/protection: None  Other Topics Concern   Not on file  Social History Narrative   ** Merged History Encounter **       Pt have 2 kids.   Social Drivers of Health   Financial Resource Strain: Medium Risk (08/06/2023)   Received from [facility]   Overall Financial Resource Strain (CARDIA)    Difficulty of Paying Living Expenses: Somewhat hard  Food Insecurity: Food Insecurity Present (08/06/2023)   Received from [facility]   Hunger Vital Sign    Worried About Running Out of Food in the Last Year: Sometimes true    Ran Out of Food in the Last Year: Often true  Transportation Needs: No Transportation Needs (08/06/2023)   Received from [facility] - Transportation    In the past 12 months, has lack of transportation kept you from medical appointments or from getting medications?: No    Lack of Transportation (Non-Medical): No  Physical Activity: Insufficiently Active (07/15/2023)   Exercise Vital Sign    Days  of Exercise per Week: 3 days    Minutes of Exercise per Session: 10 min  Stress: No Stress Concern Present (07/15/2023)   [employer] of Occupational Health - Occupational Stress Questionnaire    Feeling of Stress : Not at all  Social Connections: Moderately Isolated (07/15/2023)   Social Connection and Isolation Panel [NHANES]    Frequency of Communication with Friends and Family: Twice a week    Frequency of Social Gatherings with Friends and Family: Once a week    Attends Religious Services: Never    Database administrator or Organizations: No    Attends Engineer, structural: Not on file    Marital Status: Married  Catering manager Violence: Not At Risk (07/05/2023)   Humiliation, Afraid, Rape, and Kick questionnaire    Fear of Current or Ex-Partner: No    Emotionally Abused: No     Physically Abused: No    Sexually Abused: No   Current Outpatient Medications on File Prior to Visit  Medication Sig Dispense Refill   acetaminophen (TYLENOL) 500 MG tablet Take 1,500 mg by mouth every 6 (six) hours as needed for headache.     albuterol (VENTOLIN HFA) 108 (90 Base) MCG/ACT inhaler Inhale 2 puffs into the lungs every 6 (six) hours as needed for wheezing or shortness of breath. Inhale into the lungs every 6 (six) hours as needed for wheezing or shortness of breath. 18 g 0   brompheniramine-pseudoephedrine-DM 30-2-10 MG/5ML syrup Take 5 mLs by mouth 4 (four) times daily as needed. (Patient not taking: Reported on 08/20/2023) 240 mL 0   cholecalciferol (VITAMIN D3) 25 MCG (1000 UNIT) tablet Take 1,000 Units by mouth daily.     fluocinonide cream (LIDEX) 0.05 % Apply 1 Application topically 2 (two) times daily. (Patient not taking: Reported on 08/20/2023) 30 g 1   fluticasone (FLONASE) 50 MCG/ACT nasal spray Place 2 sprays into both nostrils daily. (Patient not taking: Reported on 08/20/2023) 16 g 0   norethindrone-ethinyl estradiol-FE (JUNEL FE 1/20) 1-20 MG-MCG tablet Take 1 tablet by mouth daily. (Patient not taking: Reported on 07/29/2023) 84 tablet 3   ondansetron (ZOFRAN-ODT) 4 MG disintegrating tablet Take 1 tablet (4 mg total) by mouth every 8 (eight) hours as needed. (Patient not taking: Reported on 08/20/2023) 20 tablet 0   triamcinolone ointment (KENALOG) 0.5 % Apply 1 Application topically 2 (two) times daily. (Patient taking differently: Apply 1 Application topically 2 (two) times daily as needed (Eczema).) 30 g 0   No current facility-administered medications on file prior to visit.   Allergies  Allergen Reactions   Aspirin Shortness Of Breath and Rash   Codeine Shortness Of Breath and Rash   Aspirin Hives   Codeine Hives   Gold Sodium Thiomalate    Almond Oil Rash   Coconut (Cocos Nucifera) Rash      Review of Systems Constitutional: No recent  fever/chills/sweats Respiratory: No recent cough/bronchitis Cardiovascular: No chest pain Gastrointestinal: No recent nausea/vomiting/diarrhea Genitourinary: No UTI symptoms Hematologic/lymphatic:No history of coagulopathy or recent blood thinner use    Objective:   Last menstrual period 07/19/2023. CONSTITUTIONAL: Well-developed, well-nourished female in no acute distress.  HENT:  Normocephalic, atraumatic, External right and left ear normal. Oropharynx is clear and moist EYES: Conjunctivae and EOM are normal. Pupils are equal, round, and reactive to light. No scleral icterus.  NECK: Normal range of motion, supple, no masses SKIN: Skin is warm and dry. No rash noted. Not diaphoretic. No erythema. No pallor. NEUROLOGIC: Alert  and oriented to person, place, and time. Normal reflexes, muscle tone coordination. No cranial nerve deficit noted. PSYCHIATRIC: Normal mood and affect. Normal behavior. Normal judgment and thought content. CARDIOVASCULAR: Normal heart rate noted, regular rhythm RESPIRATORY: Effort and breath sounds normal, no problems with respiration noted ABDOMEN: Soft, nontender, nondistended. PELVIC: Deferred MUSCULOSKELETAL: Normal range of motion. No edema and no tenderness. 2+ distal pulses.    Labs: No results found for this or any previous visit (from the past 2 weeks).   Imaging Studies: No results found.  Assessment:     ***      Plan:    Counseling: Procedure, risks, reasons, benefits and complications (including injury to bowel, bladder, major blood vessel, ureter, bleeding, possibility of transfusion, infection, or fistula formation) reviewed in detail. Likelihood of success in alleviating the patient's condition was discussed. Routine postoperative instructions will be reviewed with the patient and her family in detail after surgery.  The patient concurred with the proposed plan, giving informed written consent for the surgery.   Preop testing  ordered. Instructions reviewed, including NPO after midnight.      Tommie Raymond, CMA [facility]

## 2023-08-20 NOTE — Patient Instructions (Incomplete)
Laparoscopically Assisted Vaginal Hysterectomy  A laparoscopic assisted vaginal hysterectomy (LAVH) is a surgery to remove the uterus. The surgery is minimally invasive. This means that very small incisions are made, instead of one large incision. This surgery can be done with or without a robot. During this surgery, your fallopian tubes and ovaries may also be taken out. This surgery may be done if you have: Growths in the uterus, such as fibroids. This is the most common reason. Endometriosis. This is when the lining of the uterus is growing outside of the uterus. Pelvic organ prolapse. This is when the uterus has moved down and bulges into the vagina. Cancer of the uterus or cervix. Abnormal or heavy bleeding during your period. Long-term (chronic) pain in the pelvis. After this surgery, you'll no longer be able to have a baby, and you'll no longer have a period. Tell a health care provider about: Any allergies you have. All medicines you're taking. These include vitamins, herbs, eye drops, creams, and over-the-counter medicines. Any problems you or family members have had with anesthesia. Any bleeding problems you have. Any surgeries you've had. Any medical conditions you have. Whether you're pregnant or may be pregnant. What are the risks? Your health care provider will talk with you about risks. These may include: Bleeding or blood clots in the legs or lungs. Infection. Damage to nearby structures or organs, including nerve, bladder, or bowel injury. Reaction to the medicines used. Early symptoms of menopause. You may have hot flashes, mood swings, dryness of the vagina, and low estrogen if both ovaries are removed. What happens before the surgery? When to stop eating and drinking Follow instructions from your provider about what you may eat and drink. These may include: 8 hours before your surgery Stop eating most foods. Do not eat meat, fried foods, or fatty foods. Eat only  light foods, such as toast or crackers. All liquids are okay except energy drinks and alcohol. 6 hours before your surgery Stop eating. Drink only clear liquids, such as water, clear fruit juice, black coffee, plain tea, and sports drinks. Do not drink energy drinks or alcohol. 2 hours before your surgery Stop drinking all liquids. You may be allowed to take medicines with small sips of water. If you don't follow your provider's instructions, your surgery may be delayed or canceled. Medicines Ask your provider about: Changing or stopping your regular medicines. These include any diabetes medicines or blood thinners you take. Taking medicines, such as aspirin and ibuprofen. These medicines can thin your blood. Do not take them unless you are told to. Vitamins, herbs, and supplements. You may be asked to take a medicine to empty your colon (bowel prep). Surgery safety For your safety, your provider may: Remove hair at the surgery site. Ask you to wash with a soap that kills germs. Give you antibiotics. General instructions If you were asked to do a bowel prep before the surgery, do it as told by your provider. Do not use any products that contain nicotine or tobacco for at least 4 weeks before the surgery. These products include cigarettes, chewing tobacco, and vaping devices, such as e-cigarettes. If you need help quitting, ask your provider. What happens during the surgery? You will be given: A sedative. This helps you relax. Anesthesia. This keeps you from feeling pain. It will make you fall asleep. Your surgeon will make several small cuts, or incisions, in your belly. Tools for surgery will be inserted into the small incisions. The surgeon will  take out your uterus through the vagina. Your small incisions will be closed with stitches, skin glue, or tape strips. This surgery may vary among providers and hospitals. What happens after the surgery? Your blood pressure, heart rate,  breathing rate, and blood oxygen level will be monitored until you leave the hospital. You will be given pain medicine as needed. You will have a soft tube, or catheter, in place for a few hours. It will likely be removed the day after surgery. You will wear a pad because you will have bleeding and discharge from the vagina. Where to find more information [employer]necologists: acog.org This information is not intended to replace advice given to you by your health care provider. Make sure you discuss any questions you have with your health care provider. Document Revised: 09/28/2022 Document Reviewed: 09/28/2022 Elsevier Patient Education  2024 Elsevier Inc.  Laparoscopically Assisted Vaginal Hysterectomy, Care After  After a LAVH, it's common to have soreness and numbness in your incision areas. You'll have pain in your belly. You may also have: Bleeding and discharge from the vagina. Tiredness. Sadness and other emotions. If your ovaries were taken out, you may also have symptoms of menopause, such as hot flashes, night sweats, and lack of sleep. Follow these instructions at home: Medicines Take over-the-counter and prescription medicines only as told by your health care provider. If you were given antibiotics, take them as told by your provider. Do not stop taking the antibiotic even if you start to feel better. Ask your provider if the medicine prescribed to you: Requires you to avoid driving or using machinery. Can cause constipation. You may need to take these actions to prevent or treat constipation: Drink enough fluid to keep your pee (urine) pale yellow. Take over-the-counter or prescription medicines. Eat foods that are high in fiber, such as beans, whole grains, and fresh fruits and vegetables. Limit foods that are high in fat and processed sugars, such as fried or sweet foods. Incision care  Follow instructions from your provider about how to  take care of your incisions. Make sure you: Wash your hands with soap and water for at least 20 seconds before and after you change your bandage. If soap and water aren't available, use hand sanitizer. Change your dressing as told by your provider. Leave stitches, skin glue, or tape strips in place. These skin closures may need to stay in place for 2 weeks or longer. If tape strip edges start to loosen and curl up, you may trim the loose edges. Do not remove tape strips completely unless your provider tells you to do that. Check your incision areas every day for signs of infection. Check for: More redness, swelling, or pain. More fluid or blood. Warmth. Pus or a bad smell. Activity  Rest as told by your provider. Do not sit for a long time without moving. Get up to take short walks every 1-2 hours. This will improve blood flow and breathing. Ask for help if you feel weak or unsteady. You may have to avoid lifting. Ask your provider how much you can safely lift. Return to your normal activities as told by your provider. Ask your provider what activities are safe for you. Lifestyle Do not use any products that contain nicotine or tobacco. These products include cigarettes, chewing tobacco, and vaping devices, such as e-cigarettes. These can delay healing after surgery. If you need help quitting, ask your provider. Do not drink alcohol until your provider approves.  General instructions Do not douche, use tampons, have sex, or put anything in the vagina for at least 6 weeks. If you struggle with physical or emotional changes after your procedure, speak with your provider or a therapist. Do not take baths, swim, or use a hot tub until your provider approves. Ask your provider if you may take showers. You may only be allowed to take sponge baths. Try to have someone at home with you for the first 1-2 weeks to help with your daily chores. Wear compression stockings as told by your provider. These  stockings help to prevent blood clots and reduce swelling in your legs. Your provider may give you more instructions. Make sure you know what you can and can't do. Contact a health care provider if: You have any signs of infection. You have pain and your pain medicine doesn't help. You feel dizzy or light-headed. You have trouble peeing. You vomit or feel like you may vomit, and the symptoms do not go away. You have pus or discharge from your vagina that smells bad. Get help right away if: You have a fever and your symptoms suddenly get worse. You have very bad pain in the abdomen. You have chest pain or shortness of breath. You faint. You have pain, swelling, or redness in your leg. You have heavy bleeding in your vagina that soaks through a pad in less than 1 hour. You see blood clots in your bleeding. These symptoms may be an emergency. Get help right away. Call 911. Do not wait to see if the symptoms will go away. Do not drive yourself to the hospital. This information is not intended to replace advice given to you by your health care provider. Make sure you discuss any questions you have with your health care provider. Document Revised: 09/28/2022 Document Reviewed: 09/28/2022 Elsevier Patient Education  2024 [employer].

## 2023-08-21 ENCOUNTER — Encounter: Payer: Self-pay | Admitting: Obstetrics and Gynecology

## 2023-08-21 ENCOUNTER — Encounter: Payer: 59 | Admitting: Obstetrics and Gynecology

## 2023-08-21 ENCOUNTER — Ambulatory Visit: Payer: Medicaid Other | Admitting: Obstetrics and Gynecology

## 2023-08-21 VITALS — BP 112/83 | HR 83 | Ht 65.5 in | Wt 192.0 lb

## 2023-08-21 DIAGNOSIS — D069 Carcinoma in situ of cervix, unspecified: Secondary | ICD-10-CM

## 2023-08-21 DIAGNOSIS — Z8481 Family history of carrier of genetic disease: Secondary | ICD-10-CM

## 2023-08-21 DIAGNOSIS — N939 Abnormal uterine and vaginal bleeding, unspecified: Secondary | ICD-10-CM

## 2023-08-21 DIAGNOSIS — Z91410 Personal history of adult physical and sexual abuse: Secondary | ICD-10-CM
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
## 2023-08-21 NOTE — Patient Instructions (Addendum)
GYNECOLOGY PRE-OPERATIVE INSTRUCTIONS  You are scheduled for surgery on 09/02/2023.  The name of your procedure is: Laparoscopic -Assisted Vaginal Hysterectomy with Bilateral Salpingectomy (removal of uterus with cervix, fallopian tubes).   Please read through these instructions carefully regarding preparation for your surgery: Nothing to eat after midnight on the day prior to surgery.  Do not take any medications unless recommended by your provider on day prior to surgery.  Do not take NSAIDs (Motrin, Aleve) or aspirin 7 days prior to surgery.  You may take Tylenol products for minor aches and pains.  You will receive a prescription for pain medications post-operatively.  You will be contacted by phone approximately 1-2 weeks prior to surgery to schedule your pre-operative appointment.  You will need someone to drive you to and from the hospital on the day of surgery. You will not be able to drive for 3-4 days after surgery.  Please call the office if you have any questions regarding your upcoming surgery.    Thank you for choosing [facility] at [facility].

## 2023-08-23 ENCOUNTER — Other Ambulatory Visit: Payer: Self-pay | Admitting: Obstetrics and Gynecology

## 2023-08-23 ENCOUNTER — Ambulatory Visit
Admission: RE | Admit: 2023-08-23 | Discharge: 2023-08-23 | Disposition: A | Discharge status: Home / Self Care | Payer: Medicaid Other | Source: Ambulatory Visit | Attending: Obstetrics and Gynecology | Admitting: Obstetrics and Gynecology

## 2023-08-23 DIAGNOSIS — N939 Abnormal uterine and vaginal bleeding, unspecified: Secondary | ICD-10-CM | POA: Insufficient documentation

## 2023-08-23 DIAGNOSIS — Z91410 Personal history of adult physical and sexual abuse: Secondary | ICD-10-CM

## 2023-08-23 DIAGNOSIS — Z8481 Family history of carrier of genetic disease: Secondary | ICD-10-CM

## 2023-08-23 DIAGNOSIS — D069 Carcinoma in situ of cervix, unspecified: Secondary | ICD-10-CM

## 2023-08-26 ENCOUNTER — Encounter: Payer: Self-pay | Admitting: Obstetrics and Gynecology

## 2023-08-26 ENCOUNTER — Encounter
Admission: RE | Admit: 2023-08-26 | Discharge: 2023-08-26 | Disposition: A | Discharge status: Home / Self Care | Payer: Medicaid Other | Source: Ambulatory Visit | Attending: Obstetrics and Gynecology | Admitting: Obstetrics and Gynecology

## 2023-08-26 ENCOUNTER — Other Ambulatory Visit: Payer: Self-pay

## 2023-08-26 ENCOUNTER — Other Ambulatory Visit: Payer: Self-pay | Admitting: Obstetrics and Gynecology

## 2023-08-26 DIAGNOSIS — Z01818 Encounter for other preprocedural examination: Secondary | ICD-10-CM

## 2023-08-26 DIAGNOSIS — D649 Anemia, unspecified: Secondary | ICD-10-CM

## 2023-08-26 DIAGNOSIS — Z01812 Encounter for preprocedural laboratory examination: Secondary | ICD-10-CM

## 2023-08-26 HISTORY — DX: Abnormal uterine and vaginal bleeding, unspecified: N93.9

## 2023-08-26 HISTORY — DX: Dyspnea, unspecified: R06.00

## 2023-08-26 HISTORY — DX: Pneumonia, unspecified organism: J18.9

## 2023-08-26 HISTORY — DX: Gastro-esophageal reflux disease without esophagitis: K21.9

## 2023-08-26 HISTORY — DX: Prediabetes: R73.03

## 2023-08-26 HISTORY — DX: Unspecified asthma, uncomplicated: J45.909

## 2023-08-26 HISTORY — DX: Anemia, unspecified: D64.9

## 2023-08-26 NOTE — Patient Instructions (Addendum)
 Your procedure is scheduled on: 09/02/23 - Monday Report to the Registration Desk on the 1st floor of the [facility]. To find out your arrival time, please call [phone] between 1PM - 3PM on: 08/30/23 - Friday If your arrival time is 6:00 am, do not arrive before that time as the [facility] Mall entrance doors do not open until 6:00 am.  REMEMBER: Instructions that are not followed completely may result in serious medical risk, up to and including death; or upon the discretion of your surgeon and anesthesiologist your surgery may need to be rescheduled.  Do not eat food or drink any liquids after midnight the night before surgery.  No gum chewing or hard candies.   One week prior to surgery: Stop Anti-inflammatories (NSAIDS) such as Advil, Aleve, Ibuprofen, Motrin, Naproxen, Naprosyn and Aspirin based products such as Excedrin, Goody's Powder, BC Powder. You may take Tylenol if needed for pain up until the day of surgery.  Stop ANY OVER THE COUNTER supplements until after surgery.  Continue taking all of your other prescription medications up until the day of surgery.  ON THE DAY OF SURGERY ONLY TAKE THESE MEDICATIONS WITH SIPS OF WATER:  none  Use albuterol (VENTOLIN HFA)  on the day of surgery and bring to the hospital.   No Alcohol for 24 hours before or after surgery.  No Smoking including e-cigarettes for 24 hours before surgery.  No chewable tobacco products for at least 6 hours before surgery.  No nicotine patches on the day of surgery.  Do not use any "recreational" drugs for at least a week (preferably 2 weeks) before your surgery.  Please be advised that the combination of cocaine and anesthesia may have negative outcomes, up to and including death. If you test positive for cocaine, your surgery will be cancelled.  On the morning of surgery brush your teeth with toothpaste and water, you may rinse your mouth with mouthwash if you wish. Do not swallow any toothpaste  or mouthwash.  Use CHG Soap or wipes as directed on instruction sheet.  Do not wear jewelry, make-up, hairpins, clips or nail polish.  For welded (permanent) jewelry: bracelets, anklets, waist bands, etc.  Please have this removed prior to surgery.  If it is not removed, there is a chance that hospital personnel will need to cut it off on the day of surgery.  Do not wear lotions, powders, or perfumes.   Do not shave body hair from the neck down 48 hours before surgery.  Contact lenses, hearing aids and dentures may not be worn into surgery.  Do not bring valuables to the hospital. [facility] is not responsible for any missing/lost belongings or valuables.   Notify your doctor if there is any change in your medical condition (cold, fever, infection).  Wear comfortable clothing (specific to your surgery type) to the hospital.  After surgery, you can help prevent lung complications by doing breathing exercises.  Take deep breaths and cough every 1-2 hours. Your doctor may order a device called an Incentive Spirometer to help you take deep breaths. When coughing or sneezing, hold a pillow firmly against your incision with both hands. This is called "splinting." Doing this helps protect your incision. It also decreases belly discomfort.  If you are being admitted to the hospital overnight, leave your suitcase in the car. After surgery it may be brought to your room.  In case of increased patient census, it may be necessary for you, the patient, to continue  your postoperative care in the Same Day Surgery department.  If you are being discharged the day of surgery, you will not be allowed to drive home. You will need a responsible individual to drive you home and stay with you for 24 hours after surgery.   If you are taking public transportation, you will need to have a responsible individual with you.  Please call the Pre-admissions Testing Dept. at [phone] if you have any  questions about these instructions.  Surgery Visitation Policy:  Patients having surgery or a procedure may have two visitors.  Children under the age of 100 must have an adult with them who is not the patient.  Temporary Visitor Restrictions Due to increasing cases of flu, RSV and COVID-19: Children ages 74 and under will not be able to visit patients in [facility] hospitals under most circumstances.  Inpatient Visitation:    Visiting hours are 7 a.m. to 8 p.m. Up to four visitors are allowed at one time in a patient room. The visitors may rotate out with other people during the day.  One visitor age 33 or older may stay with the patient overnight and must be in the room by 8 p.m.     Preparing for Surgery with CHLORHEXIDINE GLUCONATE (CHG) Soap  Chlorhexidine Gluconate (CHG) Soap  o An antiseptic cleaner that kills germs and bonds with the skin to continue killing germs even after washing  o Used for showering the night before surgery and morning of surgery  Before surgery, you can play an important role by reducing the number of germs on your skin.  CHG (Chlorhexidine gluconate) soap is an antiseptic cleanser which kills germs and bonds with the skin to continue killing germs even after washing.  Please do not use if you have an allergy to CHG or antibacterial soaps. If your skin becomes reddened/irritated stop using the CHG.  1. Shower the NIGHT BEFORE SURGERY and the MORNING OF SURGERY with CHG soap.  2. If you choose to wash your hair, wash your hair first as usual with your normal shampoo.  3. After shampooing, rinse your hair and body thoroughly to remove the shampoo.  4. Use CHG as you would any other liquid soap. You can apply CHG directly to the skin and wash gently with a scrungie or a clean washcloth.  5. Apply the CHG soap to your body only from the neck down. Do not use on open wounds or open sores. Avoid contact with your eyes, ears, mouth, and genitals  (private parts). Wash face and genitals (private parts) with your normal soap.  6. Wash thoroughly, paying special attention to the area where your surgery will be performed.  7. Thoroughly rinse your body with warm water.  8. Do not shower/wash with your normal soap after using and rinsing off the CHG soap.  9. Pat yourself dry with a clean towel.  10. Wear clean pajamas to bed the night before surgery.  12. Place clean sheets on your bed the night of your first shower and do not sleep with pets.  13. Shower again with the CHG soap on the day of surgery prior to arriving at the hospital.  14. Do not apply any deodorants/lotions/powders.  15. Please wear clean clothes to the hospital.

## 2023-08-27 ENCOUNTER — Encounter
Admission: RE | Admit: 2023-08-27 | Discharge: 2023-08-27 | Disposition: A | Discharge status: Home / Self Care | Payer: Medicaid Other | Source: Ambulatory Visit | Attending: Obstetrics and Gynecology | Admitting: Obstetrics and Gynecology

## 2023-08-27 ENCOUNTER — Encounter: Payer: Self-pay | Admitting: Obstetrics and Gynecology

## 2023-08-27 DIAGNOSIS — Z01812 Encounter for preprocedural laboratory examination: Secondary | ICD-10-CM | POA: Insufficient documentation

## 2023-08-27 DIAGNOSIS — D649 Anemia, unspecified: Secondary | ICD-10-CM | POA: Diagnosis not present

## 2023-08-27 LAB — CBC
HCT: 40.9 % (ref 36.0–46.0)
Hemoglobin: 13.7 g/dL (ref 12.0–15.0)
MCH: 29.1 pg (ref 26.0–34.0)
MCHC: 33.5 g/dL (ref 30.0–36.0)
MCV: 87 fL (ref 80.0–100.0)
Platelets: 234 10*3/uL (ref 150–400)
RBC: 4.7 MIL/uL (ref 3.87–5.11)
RDW: 12.5 % (ref 11.5–15.5)
WBC: 6.3 10*3/uL (ref 4.0–10.5)
nRBC: 0 % (ref 0.0–0.2)

## 2023-08-27 LAB — TYPE AND SCREEN
ABO/RH(D): O POS
Antibody Screen: NEGATIVE

## 2023-09-01 MED ORDER — CHLORHEXIDINE GLUCONATE 0.12 % MT SOLN
15.0000 mL | Freq: Once | OROMUCOSAL | Status: AC
  Administered 2023-09-02: 15 mL via OROMUCOSAL

## 2023-09-01 MED ORDER — GABAPENTIN 300 MG PO CAPS
300.0000 mg | ORAL_CAPSULE | ORAL | Status: AC
  Administered 2023-09-02: 300 mg via ORAL

## 2023-09-01 MED ORDER — CEFAZOLIN SODIUM-DEXTROSE 2-4 GM/100ML-% IV SOLN
2.0000 g | INTRAVENOUS | Status: AC
  Administered 2023-09-02: 2 g via INTRAVENOUS

## 2023-09-01 MED ORDER — ORAL CARE MOUTH RINSE
15.0000 mL | Freq: Once | OROMUCOSAL | Status: AC
Start: 2023-09-01 — End: 2023-09-02

## 2023-09-01 MED ORDER — LACTATED RINGERS IV SOLN: INTRAVENOUS | Status: DC

## 2023-09-01 MED ORDER — ACETAMINOPHEN 500 MG PO TABS
1000.0000 mg | ORAL_TABLET | ORAL | Status: AC
  Administered 2023-09-02: 1000 mg via ORAL

## 2023-09-02 ENCOUNTER — Other Ambulatory Visit: Payer: Self-pay

## 2023-09-02 ENCOUNTER — Encounter
Admission: RE | Disposition: A | Discharge status: Home / Self Care | Payer: Self-pay | Source: Ambulatory Visit | Attending: Obstetrics and Gynecology

## 2023-09-02 ENCOUNTER — Encounter: Payer: Self-pay | Admitting: Obstetrics and Gynecology

## 2023-09-02 ENCOUNTER — Ambulatory Visit: Payer: Self-pay | Admitting: Urgent Care

## 2023-09-02 ENCOUNTER — Ambulatory Visit
Admission: RE | Admit: 2023-09-02 | Discharge: 2023-09-02 | Disposition: A | Discharge status: Home / Self Care | Payer: Medicaid Other | Source: Ambulatory Visit | Attending: Obstetrics and Gynecology | Admitting: Obstetrics and Gynecology

## 2023-09-02 ENCOUNTER — Encounter: Payer: Self-pay | Admitting: *Deleted

## 2023-09-02 DIAGNOSIS — N938 Other specified abnormal uterine and vaginal bleeding: Secondary | ICD-10-CM | POA: Insufficient documentation

## 2023-09-02 DIAGNOSIS — K219 Gastro-esophageal reflux disease without esophagitis: Secondary | ICD-10-CM | POA: Diagnosis not present

## 2023-09-02 DIAGNOSIS — D060 Carcinoma in situ of endocervix: Secondary | ICD-10-CM | POA: Insufficient documentation

## 2023-09-02 DIAGNOSIS — N838 Other noninflammatory disorders of ovary, fallopian tube and broad ligament: Secondary | ICD-10-CM | POA: Diagnosis not present

## 2023-09-02 DIAGNOSIS — J45909 Unspecified asthma, uncomplicated: Secondary | ICD-10-CM | POA: Diagnosis not present

## 2023-09-02 DIAGNOSIS — D069 Carcinoma in situ of cervix, unspecified: Secondary | ICD-10-CM

## 2023-09-02 DIAGNOSIS — Z01818 Encounter for other preprocedural examination: Secondary | ICD-10-CM

## 2023-09-02 DIAGNOSIS — N871 Moderate cervical dysplasia: Secondary | ICD-10-CM | POA: Diagnosis not present

## 2023-09-02 DIAGNOSIS — Z01812 Encounter for preprocedural laboratory examination: Secondary | ICD-10-CM

## 2023-09-02 DIAGNOSIS — N920 Excessive and frequent menstruation with regular cycle: Secondary | ICD-10-CM | POA: Diagnosis not present

## 2023-09-02 DIAGNOSIS — U071 COVID-19: Secondary | ICD-10-CM

## 2023-09-02 HISTORY — PX: LAPAROSCOPIC VAGINAL HYSTERECTOMY WITH SALPINGECTOMY: SHX6680

## 2023-09-02 LAB — COMPREHENSIVE METABOLIC PANEL
ALT: 21 U/L (ref 0–44)
AST: 18 U/L (ref 15–41)
Albumin: 3.7 g/dL (ref 3.5–5.0)
Alkaline Phosphatase: 51 U/L (ref 38–126)
Anion gap: 8 (ref 5–15)
BUN: 14 mg/dL (ref 6–20)
CO2: 22 mmol/L (ref 22–32)
Calcium: 9.1 mg/dL (ref 8.9–10.3)
Chloride: 109 mmol/L (ref 98–111)
Creatinine, Ser: 0.79 mg/dL (ref 0.44–1.00)
GFR, Estimated: >60 mL/min (ref >60)
Glucose, Bld: 112 mg/dL — ABNORMAL HIGH (ref 70–99)
Potassium: 3.7 mmol/L (ref 3.5–5.1)
Sodium: 139 mmol/L (ref 135–145)
Total Bilirubin: 0.5 mg/dL (ref 0.0–1.2)
Total Protein: 7.5 g/dL (ref 6.5–8.1)

## 2023-09-02 LAB — POCT PREGNANCY, URINE: Preg Test, Ur: NEGATIVE

## 2023-09-02 LAB — ABO/RH: ABO/RH(D): O POS

## 2023-09-02 SURGERY — HYSTERECTOMY, VAGINAL, LAPAROSCOPY-ASSISTED, WITH SALPINGECTOMY
Anesthesia: General

## 2023-09-02 MED ORDER — CEFAZOLIN SODIUM-DEXTROSE 2-4 GM/100ML-% IV SOLN
INTRAVENOUS | Status: AC
  Filled 2023-09-02: qty 100

## 2023-09-02 MED ORDER — FENTANYL CITRATE (PF) 100 MCG/2ML IJ SOLN
INTRAMUSCULAR | Status: DC | PRN
  Administered 2023-09-02 (×2): 50 ug via INTRAVENOUS

## 2023-09-02 MED ORDER — DOCUSATE SODIUM 100 MG PO CAPS: 100.0000 mg | ORAL_CAPSULE | Freq: Two times a day (BID) | ORAL | 2 refills | Status: DC | PRN

## 2023-09-02 MED ORDER — ACETAMINOPHEN 10 MG/ML IV SOLN: 1000.0000 mg | Freq: Once | INTRAVENOUS | Status: DC | PRN

## 2023-09-02 MED ORDER — OXYCODONE-ACETAMINOPHEN 5-325 MG PO TABS: 1.0000 | ORAL_TABLET | Freq: Four times a day (QID) | ORAL | 0 refills | Status: DC | PRN

## 2023-09-02 MED ORDER — DEXAMETHASONE SODIUM PHOSPHATE 10 MG/ML IJ SOLN
INTRAMUSCULAR | Status: DC | PRN
  Administered 2023-09-02: 10 mg via INTRAVENOUS

## 2023-09-02 MED ORDER — LIDOCAINE HCL (CARDIAC) PF 100 MG/5ML IV SOSY
PREFILLED_SYRINGE | INTRAVENOUS | Status: DC | PRN
  Administered 2023-09-02: 100 mg via INTRAVENOUS

## 2023-09-02 MED ORDER — BUPIVACAINE HCL (PF) 0.5 % IJ SOLN
INTRAMUSCULAR | Status: AC
  Filled 2023-09-02: qty 60

## 2023-09-02 MED ORDER — PHENYLEPHRINE 80 MCG/ML (10ML) SYRINGE FOR IV PUSH (FOR BLOOD PRESSURE SUPPORT)
PREFILLED_SYRINGE | INTRAVENOUS | Status: DC | PRN
  Administered 2023-09-02 (×3): 160 ug via INTRAVENOUS

## 2023-09-02 MED ORDER — KETAMINE HCL 50 MG/5ML IJ SOSY
PREFILLED_SYRINGE | INTRAMUSCULAR | Status: DC | PRN
  Administered 2023-09-02 (×2): 10 mg via INTRAVENOUS
  Administered 2023-09-02: 30 mg via INTRAVENOUS

## 2023-09-02 MED ORDER — MIDAZOLAM HCL 2 MG/2ML IJ SOLN
INTRAMUSCULAR | Status: AC
  Filled 2023-09-02: qty 2

## 2023-09-02 MED ORDER — DROPERIDOL 2.5 MG/ML IJ SOLN: 0.6250 mg | Freq: Once | INTRAMUSCULAR | Status: DC | PRN

## 2023-09-02 MED ORDER — DEXMEDETOMIDINE HCL IN NACL 80 MCG/20ML IV SOLN
INTRAVENOUS | Status: DC | PRN
  Administered 2023-09-02 (×3): 4 ug via INTRAVENOUS

## 2023-09-02 MED ORDER — CHLORHEXIDINE GLUCONATE 0.12 % MT SOLN
OROMUCOSAL | Status: AC
  Filled 2023-09-02: qty 15

## 2023-09-02 MED ORDER — OXYCODONE HCL 5 MG/5ML PO SOLN: 5.0000 mg | Freq: Once | ORAL | Status: AC | PRN

## 2023-09-02 MED ORDER — BUPIVACAINE HCL 0.5 % IJ SOLN
INTRAMUSCULAR | Status: DC | PRN
  Administered 2023-09-02: 15 mL

## 2023-09-02 MED ORDER — OXYCODONE HCL 5 MG PO TABS
5.0000 mg | ORAL_TABLET | Freq: Once | ORAL | Status: AC | PRN
  Administered 2023-09-02: 5 mg via ORAL

## 2023-09-02 MED ORDER — KETOROLAC TROMETHAMINE 30 MG/ML IJ SOLN
30.0000 mg | Freq: Once | INTRAMUSCULAR | Status: AC
  Administered 2023-09-02: 30 mg via INTRAVENOUS

## 2023-09-02 MED ORDER — SUGAMMADEX SODIUM 200 MG/2ML IV SOLN
INTRAVENOUS | Status: DC | PRN
  Administered 2023-09-02: 200 mg via INTRAVENOUS

## 2023-09-02 MED ORDER — GABAPENTIN 300 MG PO CAPS
ORAL_CAPSULE | ORAL | Status: AC
  Filled 2023-09-02: qty 1

## 2023-09-02 MED ORDER — OXYCODONE HCL 5 MG PO TABS
ORAL_TABLET | ORAL | Status: AC
  Filled 2023-09-02: qty 1

## 2023-09-02 MED ORDER — PROPOFOL 1000 MG/100ML IV EMUL
INTRAVENOUS | Status: AC
  Filled 2023-09-02: qty 100

## 2023-09-02 MED ORDER — FENTANYL CITRATE (PF) 100 MCG/2ML IJ SOLN
INTRAMUSCULAR | Status: AC
  Filled 2023-09-02: qty 2

## 2023-09-02 MED ORDER — PROPOFOL 10 MG/ML IV BOLUS
INTRAVENOUS | Status: DC | PRN
  Administered 2023-09-02: 100 mg via INTRAVENOUS
  Administered 2023-09-02: 150 ug/kg/min via INTRAVENOUS

## 2023-09-02 MED ORDER — IBUPROFEN 600 MG PO TABS: 600.0000 mg | ORAL_TABLET | Freq: Four times a day (QID) | ORAL | 1 refills | Status: AC | PRN

## 2023-09-02 MED ORDER — POVIDONE-IODINE 10 % EX SWAB: 2.0000 | Freq: Once | CUTANEOUS | Status: DC

## 2023-09-02 MED ORDER — KETOROLAC TROMETHAMINE 30 MG/ML IJ SOLN
INTRAMUSCULAR | Status: AC
  Filled 2023-09-02: qty 1

## 2023-09-02 MED ORDER — ONDANSETRON HCL 4 MG/2ML IJ SOLN
INTRAMUSCULAR | Status: DC | PRN
  Administered 2023-09-02: 4 mg via INTRAVENOUS

## 2023-09-02 MED ORDER — VASOPRESSIN 20 UNIT/ML IV SOLN
INTRAVENOUS | Status: DC | PRN
  Administered 2023-09-02: 15 mL

## 2023-09-02 MED ORDER — ACETAMINOPHEN 500 MG PO TABS
ORAL_TABLET | ORAL | Status: AC
  Filled 2023-09-02: qty 2

## 2023-09-02 MED ORDER — KETAMINE HCL 50 MG/5ML IJ SOSY
PREFILLED_SYRINGE | INTRAMUSCULAR | Status: AC
  Filled 2023-09-02: qty 5

## 2023-09-02 MED ORDER — ROCURONIUM BROMIDE 100 MG/10ML IV SOLN
INTRAVENOUS | Status: DC | PRN
  Administered 2023-09-02: 50 mg via INTRAVENOUS
  Administered 2023-09-02 (×2): 10 mg via INTRAVENOUS

## 2023-09-02 MED ORDER — FENTANYL CITRATE (PF) 100 MCG/2ML IJ SOLN
25.0000 ug | INTRAMUSCULAR | Status: DC | PRN
  Administered 2023-09-02 (×7): 25 ug via INTRAVENOUS

## 2023-09-02 MED ORDER — MIDAZOLAM HCL 2 MG/2ML IJ SOLN
INTRAMUSCULAR | Status: DC | PRN
  Administered 2023-09-02: 2 mg via INTRAVENOUS

## 2023-09-02 MED ORDER — VASOPRESSIN 20 UNIT/ML IV SOLN
INTRAVENOUS | Status: AC
  Filled 2023-09-02: qty 1

## 2023-09-02 SURGICAL SUPPLY — 53 items
BAG URINE DRAIN 2000ML AR STRL (UROLOGICAL SUPPLIES) ×1 IMPLANT
BLADE SURG 15 STRL LF DISP TIS (BLADE) IMPLANT
BLADE SURG SZ10 CARB STEEL (BLADE) IMPLANT
BLADE SURG SZ11 CARB STEEL (BLADE) ×1 IMPLANT
CATH URTH 16FR FL 2W BLN LF (CATHETERS) ×1 IMPLANT
CHLORAPREP W/TINT 26 (MISCELLANEOUS) ×1 IMPLANT
DERMABOND ADVANCED .7 DNX12 (GAUZE/BANDAGES/DRESSINGS) ×1 IMPLANT
ELECT REM PT RETURN 9FT ADLT (ELECTROSURGICAL) ×1 IMPLANT
ELECTRODE REM PT RTRN 9FT ADLT (ELECTROSURGICAL) ×1 IMPLANT
GAUZE 4X4 16PLY ~~LOC~~+RFID DBL (SPONGE) ×1 IMPLANT
GAUZE PACK 2X3YD (PACKING) ×1 IMPLANT
GLOVE BIO SURGEON STRL SZ 6.5 (GLOVE) ×2 IMPLANT
GLOVE INDICATOR 7.0 STRL GRN (GLOVE) ×3 IMPLANT
GLOVE PI ORTHO PRO STRL 7.5 (GLOVE) ×2 IMPLANT
GOWN STRL REUS W/ TWL LRG LVL3 (GOWN DISPOSABLE) ×3 IMPLANT
GOWN STRL REUS W/ TWL XL LVL3 (GOWN DISPOSABLE) ×1 IMPLANT
IRRIGATION STRYKERFLOW (MISCELLANEOUS) IMPLANT
IRRIGATOR STRYKERFLOW (MISCELLANEOUS) IMPLANT
IV LACTATED RINGERS 1000ML (IV SOLUTION) ×1 IMPLANT
KIT PINK PAD W/HEAD ARE REST (MISCELLANEOUS) ×1 IMPLANT
KIT PINK PAD W/HEAD ARM REST (MISCELLANEOUS) ×1 IMPLANT
KIT TURNOVER CYSTO (KITS) ×1 IMPLANT
LABEL OR SOLS (LABEL) ×1 IMPLANT
LIGASURE LAP MARYLAND 5MM 37CM (ELECTROSURGICAL) ×1 IMPLANT
MANIFOLD NEPTUNE II (INSTRUMENTS) ×1 IMPLANT
MANIPULATOR VCARE LG CRV RETR (MISCELLANEOUS) IMPLANT
MANIPULATOR VCARE SML CRV RETR (MISCELLANEOUS) IMPLANT
MANIPULATOR VCARE STD CRV RETR (MISCELLANEOUS) IMPLANT
NDL SPNL 22GX3.5 QUINCKE BK (NEEDLE) ×1 IMPLANT
NEEDLE SPNL 22GX3.5 QUINCKE BK (NEEDLE) ×1 IMPLANT
PACK BASIN MINOR ARMC (MISCELLANEOUS) ×1 IMPLANT
PACK GYN LAPAROSCOPIC (MISCELLANEOUS) ×1 IMPLANT
PAD OB MATERNITY 11 LF (PERSONAL CARE ITEMS) ×1 IMPLANT
SCISSORS METZENBAUM CVD 33 (INSTRUMENTS) ×1 IMPLANT
SCRUB CHG 4% DYNA-HEX 4OZ (MISCELLANEOUS) ×1 IMPLANT
SHEARS HARMONIC 36 ACE (MISCELLANEOUS) IMPLANT
SLEEVE Z-THREAD 5X100MM (TROCAR) ×2 IMPLANT
STRIP CLOSURE SKIN 1/2X4 (GAUZE/BANDAGES/DRESSINGS) ×1 IMPLANT
SUT MNCRL 4-0 27 PS-2 XMFL (SUTURE) ×1 IMPLANT
SUT MNCRL AB 4-0 PS2 18 (SUTURE) IMPLANT
SUT VIC AB 0 CT1 27XCR 8 STRN (SUTURE) ×2 IMPLANT
SUT VIC AB 0 CT1 36 (SUTURE) ×1 IMPLANT
SUT VIC AB 0 CT2 27 (SUTURE) ×1 IMPLANT
SUT VIC AB 2-0 CT1 (SUTURE) ×1 IMPLANT
SUTURE MNCRL 4-0 27XMF (SUTURE) ×1 IMPLANT
SYR 10ML LL (SYRINGE) ×1 IMPLANT
SYR 50ML LL SCALE MARK (SYRINGE) IMPLANT
SYR CONTROL 10ML LL (SYRINGE) ×1 IMPLANT
TAPE TRANSPORE STRL 2 31045 (GAUZE/BANDAGES/DRESSINGS) ×1 IMPLANT
TRAP FLUID SMOKE EVACUATOR (MISCELLANEOUS) ×1 IMPLANT
TROCAR Z-THREAD FIOS 5X100MM (TROCAR) ×1 IMPLANT
TUBING EVAC SMOKE HEATED PNEUM (TUBING) ×1 IMPLANT
WATER STERILE IRR 500ML POUR (IV SOLUTION) ×1 IMPLANT
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
## 2023-09-02 NOTE — Transfer of Care (Signed)
 Immediate Anesthesia Transfer of Care Note  Patient: Deborah Lambert  Procedure(s) Performed: LAPAROSCOPIC ASSISTED VAGINAL HYSTERECTOMY WITH BILATERAL SALPINGECTOMY  Patient Location: PACU  Anesthesia Type:General  Level of Consciousness: drowsy and patient cooperative  Airway & Oxygen Therapy: Patient Spontanous Breathing  Post-op Assessment: Report given to RN and Post -op Vital signs reviewed and stable  Post vital signs: stable talking with staff no Ox requirement  Last Vitals:  Vitals Value Taken Time  BP 108/76 09/02/23 1102  Temp    Pulse 75 09/02/23 1103  Resp 23 09/02/23 1103  SpO2 97 % 09/02/23 1103  Vitals shown include unfiled device data.  Last Pain:  Vitals:   09/02/23 0658  TempSrc: Temporal  PainSc: 0-No pain         Complications: No notable events documented.

## 2023-09-02 NOTE — Anesthesia Postprocedure Evaluation (Signed)
 Anesthesia Post Note  Patient: Deborah Lambert  Procedure(s) Performed: LAPAROSCOPIC ASSISTED VAGINAL HYSTERECTOMY WITH BILATERAL SALPINGECTOMY  Patient location during evaluation: PACU Anesthesia Type: General Level of consciousness: awake and alert Pain management: pain level controlled Vital Signs Assessment: post-procedure vital signs reviewed and stable Respiratory status: spontaneous breathing, nonlabored ventilation, respiratory function stable and patient connected to nasal cannula oxygen Cardiovascular status: blood pressure returned to baseline and stable Postop Assessment: no apparent nausea or vomiting Anesthetic complications: no   There were no known notable events for this encounter.   Last Vitals:  Vitals:   09/02/23 1124 09/02/23 1130  BP:  108/75  Pulse: 71 73  Resp: 15 18  Temp:    SpO2: 93% 94%    Last Pain:  Vitals:   09/02/23 1130  TempSrc:   PainSc: Asleep                 Yevette Edwards
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
## 2023-09-02 NOTE — Anesthesia Procedure Notes (Signed)
 Procedure Name: Intubation Date/Time: 09/02/2023 9:37 AM  Performed by: Maryla Morrow., CRNAPre-anesthesia Checklist: Patient identified, Patient being monitored, Timeout performed, Emergency Drugs available and Suction available Patient Re-evaluated:Patient Re-evaluated prior to induction Oxygen Delivery Method: Circle system utilized Preoxygenation: Pre-oxygenation with 100% oxygen Induction Type: IV induction Ventilation: Mask ventilation without difficulty Laryngoscope Size: Mac and 3 Grade View: Grade I Tube type: Oral Tube size: 6.5 mm Number of attempts: 1 Airway Equipment and Method: Stylet Placement Confirmation: ETT inserted through vocal cords under direct vision, positive ETCO2 and breath sounds checked- equal and bilateral Secured at: 20 cm Tube secured with: Tape Dental Injury: Teeth and Oropharynx as per pre-operative assessment

## 2023-09-03 ENCOUNTER — Ambulatory Visit: Payer: 59 | Admitting: Dermatology

## 2023-09-03 ENCOUNTER — Encounter: Payer: Self-pay | Admitting: Obstetrics and Gynecology

## 2023-09-05 ENCOUNTER — Other Ambulatory Visit: Payer: Self-pay | Admitting: Obstetrics and Gynecology

## 2023-09-05 LAB — SURGICAL PATHOLOGY

## 2023-09-05 NOTE — Patient Instructions (Signed)
 Visit Information  Deborah Lambert was given information about Medicaid Managed Care team care coordination services as a part of their [facility] Community Plan Medicaid benefit. Deborah Lambert verbally consented to engagement with the [facility] Managed Care team.   If you are experiencing a medical emergency, please call 911 or report to your local emergency department or urgent care.   If you have a non-emergency medical problem during routine business hours, please contact your provider's office and ask to speak with a nurse.   For questions related to your [facility], please call: [phone] or visit the homepage here: [email or web address]  If you would like to schedule transportation through your [facility], please call the following number at least 2 days in advance of your appointment: [phone]   Rides for urgent appointments can also be made after hours by calling Member Services.  Call the Behavioral Health Crisis Line at [phone], at any time, 24 hours a day, 7 days a week. If you are in danger or need immediate medical attention call 911.  If you would like help to quit smoking, call 1-800-QUIT-NOW ((478)384-2533) OR Espaol: 1-855-Djelo-Ya (1-324-401-0272) o para ms informacin haga clic aqu or Text READY to 536-644 to register via text  Deborah Lambert - following are the goals we discussed in your visit today:   Goals Addressed     Timeframe:  Long-Range Goal Priority:  High Start Date:  02/11/23                           Expected End Date:                       Follow Up Date: 10/07/23   - prevent colds and flu by washing hands, covering coughs and sneezes, getting enough rest - schedule and keep appointment for annual check-up    Why is this important?   Screening tests can find problems  The doctor or nurse will talk with you about which tests are  important.  09/05/23: DERM appt in April, Post op appt 3/1  Patient verbalizes understanding of instructions and care plan provided today and agrees to view in MyChart. Active MyChart status and patient understanding of how to access instructions and care plan via MyChart confirmed with patient.     The Managed Medicaid care management team will reach out to the patient again over the next 30 business  days.  The  Patient  has been provided with contact information for the Managed Medicaid care management team and has been advised to call with any health related questions or concerns.   Deborah Lambert RNC, BSN, [employer] [facility] RN Care Manager Direct Dial [phone] Website: [email or web address]    Following is a copy of your plan of care:  Care Plan : RN Care Manager Plan of Care  Updates made by Danie Chandler, RN since 09/05/2023 12:00 AM     Problem: Health Promotion or Disease Self-Management (General Plan of Care)      Long-Range Goal: Chronic Disease Management and Care Coordination Needs   Start Date: 02/11/2023  Expected End Date: 11/05/2023  Priority: High  Note:   Current Barriers:  Knowledge Deficits related to plan of care for management of Asthma, eczema, obesity, anxiety, PTSD, depression, allergies Care Coordination needs related to Psychologist/Psychiatrist referral Chronic Disease Management support and education needs related to Asthma, eczema,  obesity, anxiety, PTSD, depression, allergies 09/05/23: S/P LAVH/BSO 09/02/23-post op appt 3/11.  Doing well postoperatively.  DERM appt 4/22. Breathing WNL.    RNCM Clinical Goal(s):  Patient will verbalize understanding of plan for management of Asthma, eczema, obesity, anxiety, PTSD, depression, allergies as evidenced by patient report verbalize basic understanding of Asthma, eczema, obesity, anxiety, PTSD, depression, allergies disease process and self health management plan as evidenced  by patient report take all medications exactly as prescribed and will call provider for medication related questions as evidenced by patient report demonstrate understanding of rationale for each prescribed medication as evidenced by patient report attend all scheduled medical appointments  as evidenced by patient report and EMR review. Demonstrate ongoing  adherence to prescribed treatment plan for Asthma, eczema, obesity, anxiety, PTSD, depression, allergies  as evidenced by patient report and EMR review. continue to work with RN Care Manager to address care management and care coordination needs related to Asthma, eczema, obesity, anxiety, PTSD, depression, allergies as evidenced by adherence to CM Team Scheduled appointments work with Child psychotherapist to address  related to the management of anxiety, depression, PTSD, Bipolar related to the management of Asthma, eczema, obesity, anxiety, PTSD, depression, allergies as evidenced by review of EMR and patient or Child psychotherapist report through collaboration with Medical illustrator, provider, and care team.   Interventions: Inter-disciplinary care team collaboration (see longitudinal plan of care) Evaluation of current treatment plan related to  self management and patient's adherence to plan as established by provider Collaborated with LCSW LCSW referral for anxiety, depression, PTSD, BP-completed  Asthma: (Status:New goal.) Long Term Goal Discussed the importance of adequate rest and management of fatigue with Asthma Assessed social determinant of health barriers   Patient Goals/Self-Care Activities: Take all medications as prescribed Attend all scheduled provider appointments Call pharmacy for medication refills 3-7 days in advance of running out of medications Perform all self care activities independently  Perform IADL's (shopping, preparing meals, housekeeping, managing finances) independently Call provider office for new concerns or questions   Work with the social worker to address care coordination needs and will continue to work with the clinical team to address health care and disease management related needs  Follow Up Plan:  The patient has been provided with contact information for the care management team and has been advised to call with any health related questions or concerns.  The care management team will reach out to the patient again over the next 30 business  days.

## 2023-09-05 NOTE — Patient Outreach (Signed)
 Medicaid Managed Care   Nurse Care Manager Note  09/05/2023 Name:  Deborah Lambert MRN:  [id number] DOB:  05/28/1986  Deborah Lambert is an 38 y.o. year old female who is a primary patient of Deborah Lambert, [place].  The [facility] Managed Care Coordination team was consulted for assistance with:    Chronic healthcare management needs, allergies, asthma, eczema, dermatitis, fatigue, h/o anemia, anxiety/depression/PTSD/BP  Ms. Hett was given information about Medicaid Managed Care Coordination team services today. Deborah Lambert Patient agreed to services and verbal consent obtained.  Engaged with patient by telephone for follow up visit in response to provider referral for case management and/or care coordination services.   Patient is participating in a Managed Medicaid Plan:  Yes  Assessments/Interventions:  Review of past medical history, allergies, medications, health status, including review of consultants reports, laboratory and other test data, was performed as part of comprehensive evaluation and provision of chronic care management services.  SDOH (Social Drivers of Health) assessments and interventions performed: SDOH Interventions    Flowsheet Row Patient Outreach Telephone from 09/05/2023 in [place] POPULATION HEALTH DEPARTMENT Patient Outreach Telephone from 08/08/2023 in [place] POPULATION HEALTH DEPARTMENT Patient Outreach Telephone from 07/05/2023 in [place] POPULATION HEALTH DEPARTMENT Patient Outreach Telephone from 05/14/2023 in [place] POPULATION HEALTH DEPARTMENT Patient Outreach Telephone from 04/05/2023 in [place] POPULATION HEALTH DEPARTMENT Patient Outreach Telephone from 03/21/2023 in [place] POPULATION HEALTH DEPARTMENT  SDOH Interventions        Utilities Interventions -- -- Intervention Not Indicated -- -- --  Alcohol Usage Interventions -- Intervention Not Indicated (Score <7) -- Intervention Not Indicated (Score <7) -- --  Stress  Interventions -- -- -- -- [employer], Provide Counseling  [Is in the process of preparing for relocation] [employer], Museum/gallery conservator from being sick]  Social Connections Interventions Intervention Not Indicated -- -- -- -- --  Health Literacy Interventions -- Intervention Not Indicated -- Intervention Not Indicated -- --     Care Plan Allergies  Allergen Reactions   Aspirin Shortness Of Breath and Rash   Codeine Shortness Of Breath and Rash   Gold Sodium Thiomalate    Almond Oil Rash   Coconut (Cocos Nucifera) Rash    Medications Reviewed Today     Reviewed by Danie Chandler, RN (Registered Nurse) on 09/05/23 at 1231  Med List Status: <None>   Medication Order Taking? Sig Documenting Provider Last Dose Status Informant  albuterol (VENTOLIN HFA) 108 (90 Base) MCG/ACT inhaler [id number] No Inhale 2 puffs into the lungs every 6 (six) hours as needed for wheezing or shortness of breath. Inhale into the lungs every 6 (six) hours as needed for wheezing or shortness of breath. Deborah Lat, PA-C Past Month Active Self  cholecalciferol (VITAMIN D3) 25 MCG (1000 UNIT) tablet [id number] No Take 1,000 Units by mouth daily. [provider] Past Week Active Self  docusate sodium (COLACE) 100 MG capsule [id number]  Take 1 capsule (100 mg total) by mouth 2 (two) times daily as needed. Hildred Laser, MD  Active   fluocinonide cream (LIDEX) 0.05 % [id number] No Apply 1 Application topically 2 (two) times daily. Deborah Lat, PA-C Past Month Active Self  ibuprofen (ADVIL) 600 MG tablet [id number]  Take 1 tablet (600 mg total) by mouth every 6 (six) hours as needed. Hildred Laser, MD  Active   ondansetron (ZOFRAN-ODT) 4 MG disintegrating tablet [id number] No Take 1 tablet (4 mg total) by mouth  every 8 (eight) hours as needed. Delton Prairie, MD Taking Active Self  oxyCODONE-acetaminophen (PERCOCET) 5-325 MG tablet [id number]  Take 1-2  tablets by mouth every 6 (six) hours as needed for severe pain (pain score 7-10). Hildred Laser, MD  Active   triamcinolone ointment (KENALOG) 0.5 % [id number] No Apply 1 Application topically 2 (two) times daily.  Patient taking differently: Apply 1 Application topically 2 (two) times daily as needed (Eczema).   Tommie Sams, DO Past Month Active Self           Patient Active Problem List   Diagnosis Date Noted   COVID-19 07/17/2023   History of trauma 06/14/2023   Well woman exam with routine gynecological exam 06/14/2023   Atopic dermatitis 05/24/2023   Moderate persistent asthma, uncomplicated 05/24/2023   Acute stress reaction 05/13/2023   Postconcussion syndrome 05/08/2023   At high risk for breast cancer 04/26/2023   Family history of breast cancer gene mutation in first degree relative 04/26/2023   Genetic testing 04/16/2023   Generalized edema 11/12/2022   Dizziness 11/12/2022   Encounter for screening for HIV 11/12/2022   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/12/2022   Encounter for hepatitis C screening test for low risk patient 11/12/2022   Obesity (BMI 30-39.9) 10/31/2022   Anxiety 10/31/2022   Uncomplicated asthma 10/31/2022   Eczema of both hands 10/31/2022   PTSD (post-traumatic stress disorder) 10/31/2022   Encounter for medical examination to establish care 10/31/2022   Seasonal allergies 10/31/2022   Depression, recurrent (HCC) 10/31/2022   Conditions to be addressed/monitored per PCP order:  Chronic healthcare management needs, allergies, asthma, eczema, dermatitis, fatigue, h/o anemia, anxiety/depression/PTSD/BP  Care Plan : RN Care Manager Plan of Care  Updates made by Danie Chandler, RN since 09/05/2023 12:00 AM     Problem: Health Promotion or Disease Self-Management (General Plan of Care)      Long-Range Goal: Chronic Disease Management and Care Coordination Needs   Start Date: 02/11/2023  Expected End Date: 11/05/2023  Priority: High  Note:    Current Barriers:  Knowledge Deficits related to plan of care for management of Asthma, eczema, obesity, anxiety, PTSD, depression, allergies Care Coordination needs related to Psychologist/Psychiatrist referral Chronic Disease Management support and education needs related to Asthma, eczema, obesity, anxiety, PTSD, depression, allergies 09/05/23: S/P LAVH/BSO 09/02/23-post op appt 3/11.  Doing well postoperatively.  DERM appt 4/22. Breathing WNL.    RNCM Clinical Goal(s):  Patient will verbalize understanding of plan for management of Asthma, eczema, obesity, anxiety, PTSD, depression, allergies as evidenced by patient report verbalize basic understanding of Asthma, eczema, obesity, anxiety, PTSD, depression, allergies disease process and self health management plan as evidenced by patient report take all medications exactly as prescribed and will call provider for medication related questions as evidenced by patient report demonstrate understanding of rationale for each prescribed medication as evidenced by patient report attend all scheduled medical appointments  as evidenced by patient report and EMR review. Demonstrate ongoing  adherence to prescribed treatment plan for Asthma, eczema, obesity, anxiety, PTSD, depression, allergies  as evidenced by patient report and EMR review. continue to work with RN Care Manager to address care management and care coordination needs related to Asthma, eczema, obesity, anxiety, PTSD, depression, allergies as evidenced by adherence to CM Team Scheduled appointments work with Child psychotherapist to address  related to the management of anxiety, depression, PTSD, Bipolar related to the management of Asthma, eczema, obesity, anxiety, PTSD, depression, allergies as evidenced by review  of EMR and patient or Child psychotherapist report through collaboration with Medical illustrator, provider, and care team.   Interventions: Inter-disciplinary care team collaboration (see longitudinal  plan of care) Evaluation of current treatment plan related to  self management and patient's adherence to plan as established by provider Collaborated with LCSW LCSW referral for anxiety, depression, PTSD, BP-completed  Asthma: (Status:New goal.) Long Term Goal Discussed the importance of adequate rest and management of fatigue with Asthma Assessed social determinant of health barriers   Patient Goals/Self-Care Activities: Take all medications as prescribed Attend all scheduled provider appointments Call pharmacy for medication refills 3-7 days in advance of running out of medications Perform all self care activities independently  Perform IADL's (shopping, preparing meals, housekeeping, managing finances) independently Call provider office for new concerns or questions  Work with the social worker to address care coordination needs and will continue to work with the clinical team to address health care and disease management related needs  Follow Up Plan:  The patient has been provided with contact information for the care management team and has been advised to call with any health related questions or concerns.  The care management team will reach out to the patient again over the next 30 business  days.   Long-Range Goal: Establish Plan of Care for Chronic Disease Management and Care Coordination Needs   Priority: High  Note:   Timeframe:  Long-Range Goal Priority:  High Start Date:  02/11/23                           Expected End Date:                       Follow Up Date: 10/07/23   - prevent colds and flu by washing hands, covering coughs and sneezes, getting enough rest - schedule and keep appointment for annual check-up    Why is this important?   Screening tests can find problems  The doctor or nurse will talk with you about which tests are important.  09/05/23: DERM appt in April, Post op appt 3/11   Follow Up:  Patient agrees to Care Plan and Follow-up.  Plan: The  Managed Medicaid care management team will reach out to the patient again over the next 30 business  days. and The  Patient has been provided with contact information for the Managed Medicaid care management team and has been advised to call with any health related questions or concerns.  Date/time of next scheduled RN care management/care coordination outreach: 10/07/23 at 230

## 2023-09-06 ENCOUNTER — Encounter: Payer: Self-pay | Admitting: Obstetrics and Gynecology

## 2023-09-10 ENCOUNTER — Ambulatory Visit: Payer: Medicaid Other | Admitting: Obstetrics and Gynecology

## 2023-09-10 ENCOUNTER — Encounter: Payer: Self-pay | Admitting: Obstetrics and Gynecology

## 2023-09-10 VITALS — BP 107/78 | HR 90 | Resp 16 | Ht 65.5 in | Wt 210.1 lb

## 2023-09-10 DIAGNOSIS — Z4889 Encounter for other specified surgical aftercare: Secondary | ICD-10-CM

## 2023-09-10 DIAGNOSIS — D069 Carcinoma in situ of cervix, unspecified: Secondary | ICD-10-CM

## 2023-09-10 DIAGNOSIS — Z90710 Acquired absence of both cervix and uterus: Secondary | ICD-10-CM

## 2023-09-10 NOTE — Progress Notes (Signed)
    OBSTETRICS/GYNECOLOGY POST-OPERATIVE CLINIC VISIT  Subjective:     Deborah Lambert is a 38 y.o. female who presents to the clinic 1 weeks status post LAPAROSCOPIC ASSISTED VAGINAL HYSTERECTOMY WITH BILATERAL SALPINGECTOMY  for Moderate cervical dysplasia, menorrhagia with irregular cycles . Eating a regular diet with difficulty, she notes a decrease in her appetite. Bowel movements are normal. Pain is controlled with current analgesics. Medications being used: ibuprofen (OTC). She complains of left side pain that comes and goes and managed with Ibuprofen. She also notes some peeling, draining and redness at incision sites.  The following portions of the patient's history were reviewed and updated as appropriate: allergies, current medications, past family history, past medical history, past social history, past surgical history, and problem list.  Review of Systems Pertinent items noted in HPI and remainder of comprehensive ROS otherwise negative.   Objective:   BP 107/78   Pulse 90   Resp 16   Ht 5' 5.5" (1.664 m)   Wt 210 lb 1.6 oz (95.3 kg)   LMP 08/12/2023 (Approximate)   BMI 34.43 kg/m  Body mass index is 34.43 kg/m.  General:  alert and no distress  Abdomen: soft, bowel sounds active, non-tender  Incision:   healing well, no drainage, no erythema, no hernia, no seroma, no swelling, no dehiscence, incision well approximated    Pathology:        1. Uterus, cervix and bilateral fallopian tubes,  :      - HIGH-GRADE SQUAMOUS INTRAEPITHELIAL LESION (HSIL/CIN3).      - ENDOCERVIX WITH BENIGNL MUCOCELE-LIKE LESION SECONDARY TO RUPTURE OF NABOTHIAN      CYSTS.      - SECRETORY ENDOMETRIUM.      - UNREMARKABLE MYOMETRIUM.      - BILATERAL FALLOPIAN TUBES WITH FIMBRIATED END; STATUS POST TUBAL LIGATION.      - LEFT PARATUBAL CYST.      - NEGATIVE FOR MALIGNANCY.    Assessment:   Patient s/p LAPAROSCOPIC ASSISTED VAGINAL HYSTERECTOMY WITH BILATERAL SALPINGECTOMY   Doing well postoperatively.   Plan:   1. Continue any current medications as instructed by provider. 2. Wound care discussed. 3. Operative findings again reviewed. Pathology report discussed.  Advised that due to high grade dysplasia will still need to continue routine pap smears of vaginal cuff for total of 20 years.  4. Activity restrictions: no bending, stooping, or squatting, no lifting more than 15 pounds, no overhead lifting, and pelvic rest 5. Anticipated return to work: not applicable. 6. Follow up:  4-5  weeks for post-op visit.     Hildred Laser, MD [facility]

## 2023-09-12 ENCOUNTER — Encounter: Payer: Self-pay | Admitting: Obstetrics and Gynecology

## 2023-10-04 NOTE — Progress Notes (Signed)
 OBSTETRICS/GYNECOLOGY POST-OPERATIVE CLINIC VISIT  Subjective:     Deborah Lambert is a 38 y.o. G20P2002 female who presents to the clinic 5 weeks status post LAPAROSCOPIC ASSISTED VAGINAL HYSTERECTOMY WITH BILATERAL SALPINGECTOMY for Moderate cervical dysplasia, menorrhagia with irregular cycles . Eating a regular diet without difficulty, however she still has some loss of appetite and eats in smaller portions. Bowel movements are normal. Pain is controlled with current analgesics. Medications being used: ibuprofen (OTC). She reports that she is still having some lower pelvic pain that comes and goes, usually resolves with use of Ibuprofen or heating pad. Also notes some pink pebbles when she urinates.  Denies history of kidney stones.   The following portions of the patient's history were reviewed and updated as appropriate: allergies, current medications, past family history, past medical history, past social history, past surgical history, and problem list.  Review of Systems Pertinent items noted in HPI and remainder of comprehensive ROS otherwise negative.   Objective:   BP 108/84   Pulse 81   Resp 16   Ht 5' 5.5" (1.664 m)   Wt 205 lb (93 kg)   LMP 08/12/2023 (Approximate)   BMI 33.59 kg/m  Body mass index is 33.59 kg/m.  General:  alert and no distress  Abdomen: soft, bowel sounds active, non-tender  Incision:   healing well, no drainage, no erythema, no hernia, no seroma, no swelling, no dehiscence, incision well approximated. Abdomen with mild tenderness near suprapubic region.   Pelvis: external genitalia normal, rectovaginal septum normal.  Vagina with small amount of discharge, thin, white. Vaginal cuff healing well, 1 stitch still present. Mild tenderness with exam of vaginal cuff with Q-tip. Cervix normal appearing, no lesions and no motion tenderness.  Uterus mobile, nontender, normal shape and size.  Adnexae non-palpable, nontender bilaterally.      Pathology:    FINAL DIAGNOSIS       1. Uterus, cervix and bilateral fallopian tubes,  :      - HIGH-GRADE SQUAMOUS INTRAEPITHELIAL LESION (HSIL/CIN3).      - ENDOCERVIX WITH BENIGNL MUCOCELE-LIKE LESION SECONDARY TO RUPTURE OF NABOTHIAN      CYSTS.      - SECRETORY ENDOMETRIUM.      - UNREMARKABLE MYOMETRIUM.      - BILATERAL FALLOPIAN TUBES WITH FIMBRIATED END; STATUS POST TUBAL LIGATION.      - LEFT PARATUBAL CYST.      - NEGATIVE FOR MALIGNANCY.    Labs:  Results for orders placed or performed in visit on 10/08/23  POC Urinalysis Dipstick OB   Collection Time: 10/08/23  3:50 PM  Result Value Ref Range   Color, UA     Clarity, UA     Glucose, UA Negative Negative   Bilirubin, UA Negative    Ketones, UA Negative    Spec Grav, UA 1.025 1.010 - 1.025   Blood, UA Trace    pH, UA 5.0 5.0 - 8.0   POC,PROTEIN,UA Negative Negative, Trace, Small (1+), Moderate (2+), Large (3+), 4+   Urobilinogen, UA 0.2 0.2 or 1.0 E.U./dL   Nitrite, UA Negative    Leukocytes, UA Small (1+) (A) Negative   Appearance     Odor      Assessment:   Patient s/p LAPAROSCOPIC ASSISTED VAGINAL HYSTERECTOMY WITH BILATERAL SALPINGECTOMY Pelvic pain CIN III   Plan:   1. Continue any current medications as instructed by provider.   2. Wound care discussed. 3. Operative findings again reviewed. Pathology report discussed.  4. Activity restrictions: no bending, stooping, or squatting, no lifting more than 10-15 pounds, and pelvic rest for 1-2 additional weeks 5. Anticipated return to work: now, if applicable with light duty for next 1-2 weeks. 6.  Pelvic pain, unclear cause. Vaginal cuff overall healing well, will rule out vaginitis as cause. UA today without obvious evidence of UTI. Encouraged aggressive hydration and AZO/cranberry juice intake.  7. Discussed that with h/o CIN III, she will still require routine screening pap smears of vaginal cuff for a total of 20 years s/p hysterectomy.  8. Follow up: 1  year   for annual exam.   Hildred Laser, MD [facility]

## 2023-10-07 ENCOUNTER — Ambulatory Visit: Payer: Self-pay

## 2023-10-07 ENCOUNTER — Other Ambulatory Visit: Payer: Self-pay

## 2023-10-07 ENCOUNTER — Ambulatory Visit: Payer: Self-pay | Admitting: Obstetrics and Gynecology

## 2023-10-07 VITALS — Ht 65.5 in | Wt 205.0 lb

## 2023-10-07 DIAGNOSIS — Z139 Encounter for screening, unspecified: Secondary | ICD-10-CM

## 2023-10-07 NOTE — Patient Instructions (Signed)
 Visit Information  Thank you for taking time to visit with me today. Please don't hesitate to contact me if I can be of assistance to you before our next scheduled appointment.  Your next care management appointment is by telephone on 11/07/23 at 1:30 pm  Please call the care guide team at [phone] if you need to cancel, schedule, or reschedule an appointment.   Please call the Suicide and Crisis Lifeline: 988 call 1-800-273-TALK (toll free, 24 hour hotline) if you are experiencing a Mental Health or Behavioral Health Crisis or need someone to talk to.  George Ina RN, BSN, CCM [employer], Population Health Case Manager Phone: [phone]

## 2023-10-07 NOTE — Patient Outreach (Unsigned)
 Complex Care Management   Visit Note  10/07/2023  Name:  Deborah Lambert MRN: [id number] DOB: 26-Aug-1985  Situation: Referral received for Complex Care Management related to  status post laparoscopic vaginal hysterectomy.  Patient transferred from Darrold Junker, RN case manager.  I obtained verbal consent from patient.  Visit completed with patient  on the phone  Background:   Past Medical History:  Diagnosis Date   Abnormal uterine bleeding    Anemia    Anxiety    Asthma    Bell's palsy    CIN III (cervical intraepithelial neoplasia grade III) with severe dysplasia 09/02/2023   Cluster headaches    Concussion 2024   due to trauma   Dyspnea    Focal seizures (HCC)    GERD (gastroesophageal reflux disease)    Pneumonia    Pre-diabetes    PTSD (post-traumatic stress disorder)    Seizures (HCC)     Assessment: Per chart review patient status post laparoscopic vaginal hysterectomy on 09/02/23.  Reports experiencing symptoms over the last 2-3 days.  Patient reports anxiety/ depression symptoms. Reports not under treatment at this time. Offered referral to Child psychotherapist however patient declined.    Patient Reported Symptoms: Reports having stretching/ pinching symptoms at her incision sites.  Reports vaginal pain when sitting and walking, vaginal odor and discharge.  Scheduled for GYN follow up on 10/08/23.   See PHQ2-9 and GAD 7 below.   Cognitive Alert and oriented to person, place, and time, Normal speech and language skills  Neurological Other: (patient reports sustaining a concussion 4-5 months ago when piece of furniture fell on her head. She reports having follow up with provider. She states she was advised to be careful with bending over and to sit down if/when feeling dizzy.)    HEENT No symptoms reported    Cardiovascular Other: patient states she has been told she has PVC's on occasion and an enlarged heart. Has not seen heart doctor is approximately 2 years.  She states she  follows up with primary provider.  Respiratory Shortness of breath (patient states this is due to her asthma. States this is baseline for her. Reports using inhaler as needed.)    Endocrine No symptoms reported    Gastrointestinal No symptoms reported    Genitourinary Other patient reports having laparoscopic vaginal hysterectomy on 09/02/23. Reports a stretching feeling and/ or pinching feeling in her laparoscopic incisions.  Reports pain when sitting or walking in her vaginal area. She reports having odor and white/ yellow vaginal discharge.  Patient states she is scheduled ot see her GYN on 10/08/23  Integumentary No symptoms reported    Musculoskeletal No symptoms reported    Psychosocial Depression - if selected complete PHQ 2-9, Anxiety - if selected complete GAD     There were no vitals filed for this visit.     10/07/2023    2:41 PM 09/05/2023   12:30 PM 07/04/2023    3:03 PM  Depression screen PHQ 2/9  Decreased Interest 1 0 0  Down, Depressed, Hopeless 1 0 0  PHQ - 2 Score 2 0 0  Altered sleeping 2  2  Tired, decreased energy 1  2  Change in appetite 3  0  Feeling bad or failure about yourself  2  0  Trouble concentrating 1  0  Moving slowly or fidgety/restless 2  0  Suicidal thoughts 0  0  PHQ-9 Score 13  4  Difficult doing work/chores Somewhat difficult  Somewhat difficult  10/07/2023    4:05 PM 10/07/2023    2:45 PM 07/04/2023    3:03 PM 10/31/2022    9:50 AM  GAD 7 : Generalized Anxiety Score  Nervous, Anxious, on Edge  1 0 1  Control/stop worrying  1 0 3  Worry too much - different things  1 0 3  Trouble relaxing  1 0 3  Restless  3 0 3  Easily annoyed or irritable  3 1 3   Afraid - awful might happen  1 0 0  Total GAD 7 Score  11 1 16   Anxiety Difficulty Somewhat difficult  Not difficult at all Somewhat difficult      Medications Reviewed Today     Reviewed by Otho Ket, RN (Registered Nurse) on 10/07/23 at 1420  Med List Status: <None>   Medication  Order Taking? Sig Documenting Provider Last Dose Status Informant  albuterol (VENTOLIN HFA) 108 (90 Base) MCG/ACT inhaler [id number] Yes Inhale 2 puffs into the lungs every 6 (six) hours as needed for wheezing or shortness of breath. Inhale into the lungs every 6 (six) hours as needed for wheezing or shortness of breath. Debera Lat, PA-C Taking Active Self  cholecalciferol (VITAMIN D3) 25 MCG (1000 UNIT) tablet [id number]  Take 1,000 Units by mouth daily. [provider]  Active Self  docusate sodium (COLACE) 100 MG capsule [id number] Yes Take 1 capsule (100 mg total) by mouth 2 (two) times daily as needed. Hildred Laser, MD Taking Active   fluocinonide cream (LIDEX) 0.05 % [id number] No Apply 1 Application topically 2 (two) times daily.  Patient not taking: Reported on 10/07/2023   Debera Lat, PA-C Not Taking Active Self  ibuprofen (ADVIL) 600 MG tablet [id number] Yes Take 1 tablet (600 mg total) by mouth every 6 (six) hours as needed. Hildred Laser, MD Taking Active   ondansetron (ZOFRAN-ODT) 4 MG disintegrating tablet [id number] Yes Take 1 tablet (4 mg total) by mouth every 8 (eight) hours as needed. Delton Prairie, MD Taking Active Self  triamcinolone ointment (KENALOG) 0.5 % [id number] Yes Apply 1 Application topically 2 (two) times daily. Tommie Sams, DO Taking Active Self            Recommendation:   PCP Follow-up regarding PHQ2-9 and GAD results. Message sent to primary care provider requesting follow up with patient.  Follow up regarding post laparoscopic vaginal hysterectomy symptoms. - Patient scheduled  to  follow up  with GYN provider on 10/08/23.   Follow Up Plan:   Telephone follow-up in 1 month  George Ina RN, BSN, CCM [facility], Population Health Case Manager Phone: [phone]

## 2023-10-08 ENCOUNTER — Encounter: Payer: Self-pay | Admitting: Obstetrics and Gynecology

## 2023-10-08 ENCOUNTER — Telehealth: Payer: Self-pay

## 2023-10-08 ENCOUNTER — Other Ambulatory Visit (HOSPITAL_COMMUNITY)
Admission: RE | Admit: 2023-10-08 | Discharge: 2023-10-08 | Disposition: A | Discharge status: Home / Self Care | Source: Ambulatory Visit | Attending: Obstetrics and Gynecology | Admitting: Obstetrics and Gynecology

## 2023-10-08 ENCOUNTER — Ambulatory Visit (INDEPENDENT_AMBULATORY_CARE_PROVIDER_SITE_OTHER): Admitting: Obstetrics and Gynecology

## 2023-10-08 VITALS — BP 108/84 | HR 81 | Resp 16 | Ht 65.5 in | Wt 205.0 lb

## 2023-10-08 DIAGNOSIS — R1030 Lower abdominal pain, unspecified: Secondary | ICD-10-CM | POA: Insufficient documentation

## 2023-10-08 DIAGNOSIS — R102 Pelvic and perineal pain: Secondary | ICD-10-CM

## 2023-10-08 DIAGNOSIS — Z4889 Encounter for other specified surgical aftercare: Secondary | ICD-10-CM | POA: Diagnosis not present

## 2023-10-08 DIAGNOSIS — D069 Carcinoma in situ of cervix, unspecified: Secondary | ICD-10-CM

## 2023-10-08 DIAGNOSIS — Z90710 Acquired absence of both cervix and uterus: Secondary | ICD-10-CM

## 2023-10-08 LAB — POCT URINALYSIS DIPSTICK OB
Bilirubin, UA: NEGATIVE
Glucose, UA: NEGATIVE
Ketones, UA: NEGATIVE
Nitrite, UA: NEGATIVE
POC,PROTEIN,UA: NEGATIVE
Spec Grav, UA: 1.025 (ref 1.010–1.025)
Urobilinogen, UA: 0.2 U/dL
pH, UA: 5 (ref 5.0–8.0)

## 2023-10-08 NOTE — Progress Notes (Signed)
   Telephone encounter was:  Successful.  Complex Care Management Note Care Guide Note  10/08/2023 Name: Deborah Lambert MRN: [id number] DOB: Sep 14, 1985  [name] is a 38 y.o. year old female who is a primary care patient of Debera Lat, [place] . The community resource team was consulted for assistance with Food Insecurity and Financial Difficulties related to Financial strain  SDOH screenings and interventions completed:  Yes  Social Drivers of Health From This Encounter   Food Insecurity: Food Insecurity Present (10/08/2023)   Hunger Vital Sign    Worried About Running Out of Food in the Last Year: Sometimes true    Ran Out of Food in the Last Year: Sometimes true  Financial Resource Strain: High Risk (10/08/2023)   Overall Financial Resource Strain (CARDIA)    Difficulty of Paying Living Expenses: Very hard  Utilities: At Risk (10/08/2023)   Utilities    Threatened with loss of utilities: Yes    SDOH Interventions Today    Flowsheet Row Most Recent Value  SDOH Interventions   Food Insecurity Interventions [id number] Referral, Community Resources Provided  Utilities Interventions Community Resources Provided, [id number] Referral  Financial Strain Interventions Community Resources Provided, [id number] Referral        Care guide performed the following interventions: Patient provided with information about care guide support team and interviewed to confirm resource needs.Pt has health problems and cant work, pt is having financial strain and cant afford to pay bills and buy food. I will be mailing and emailing resources as requested as well as [place] [facility] referrals   Follow Up Plan:  No further follow up planned at this time. The patient has been provided with needed resources.  Encounter Outcome:  Patient Visit Completed    Lenard Forth [facility]  [facility] Guide, Phone: [phone] Fax: [phone] Website:  [email or web address]

## 2023-10-09 ENCOUNTER — Encounter: Payer: Self-pay | Admitting: Obstetrics and Gynecology

## 2023-10-09 LAB — CERVICOVAGINAL ANCILLARY ONLY
Bacterial Vaginitis (gardnerella): NEGATIVE
Candida Glabrata: NEGATIVE
Candida Vaginitis: NEGATIVE
Comment: NEGATIVE
Comment: NEGATIVE
Comment: NEGATIVE

## 2023-10-10 NOTE — Patient Outreach (Signed)
 Complex Care Management   Visit Note  10/10/2023  Name:  Deborah Lambert MRN: [id number] DOB: 06/30/1986  Situation: Referral received for Complex Care Management related to  status post laparoscopic vaginal hysterectomy / anxiety/ depression  I obtained verbal consent from patient.  Visit completed with patient  on the phone  Spoke with Assunta Found to schedule patient follow up visit with primary provider at providers request. Follow up appointment scheduled for 10/14/23 at 10 am. Contacted patient and notified her of date and time for follow up appointment with primary care provider.   Background:   Past Medical History:  Diagnosis Date   Abnormal uterine bleeding    Anemia    Anxiety    Asthma    Bell's palsy    CIN III (cervical intraepithelial neoplasia grade III) with severe dysplasia 09/02/2023   Cluster headaches    Concussion 2024   due to trauma   Dyspnea    Focal seizures (HCC)    GERD (gastroesophageal reflux disease)    Pneumonia    Pre-diabetes    PTSD (post-traumatic stress disorder)    Seizures (HCC)     Assessment: Patient Reported Symptoms:  Cognitive    Neurological      HEENT      Cardiovascular      Respiratory      Endocrine      Gastrointestinal      Genitourinary      Integumentary      Musculoskeletal      Psychosocial       There were no vitals filed for this visit.  Medications Reviewed Today   Medications were not reviewed in this encounter     Recommendation:   Ongoing care management follow up   Follow Up Plan:   Telephone follow up appointment date/time:  as previously scheduled  George Ina RN, BSN, CCM [place]  [facility], Population Health Case Manager Phone: [phone]

## 2023-10-14 ENCOUNTER — Inpatient Hospital Stay: Admitting: Physician Assistant

## 2023-10-14 NOTE — Progress Notes (Deleted)
 Established patient visit  Patient: Deborah Lambert   DOB: 07/30/1985   38 y.o. Female  MRN: [id number] Visit Date: 10/14/2023  Today's healthcare provider: Debera Lat, PA-C   No chief complaint on file.  Subjective       Discussed the use of AI scribe software for clinical note transcription with the patient, who gave verbal consent to proceed.  History of Present Illness        10/07/2023    2:41 PM 09/05/2023   12:30 PM 07/04/2023    3:03 PM  Depression screen PHQ 2/9  Decreased Interest 1 0 0  Down, Depressed, Hopeless 1 0 0  PHQ - 2 Score 2 0 0  Altered sleeping 2  2  Tired, decreased energy 1  2  Change in appetite 3  0  Feeling bad or failure about yourself  2  0  Trouble concentrating 1  0  Moving slowly or fidgety/restless 2  0  Suicidal thoughts 0  0  PHQ-9 Score 13  4  Difficult doing work/chores Somewhat difficult  Somewhat difficult      10/07/2023    4:05 PM 10/07/2023    2:45 PM 07/04/2023    3:03 PM 10/31/2022    9:50 AM  GAD 7 : Generalized Anxiety Score  Nervous, Anxious, on Edge  1 0 1  Control/stop worrying  1 0 3  Worry too much - different things  1 0 3  Trouble relaxing  1 0 3  Restless  3 0 3  Easily annoyed or irritable  3 1 3   Afraid - awful might happen  1 0 0  Total GAD 7 Score  11 1 16   Anxiety Difficulty Somewhat difficult  Not difficult at all Somewhat difficult    Medications: Outpatient Medications Prior to Visit  Medication Sig   albuterol (VENTOLIN HFA) 108 (90 Base) MCG/ACT inhaler Inhale 2 puffs into the lungs every 6 (six) hours as needed for wheezing or shortness of breath. Inhale into the lungs every 6 (six) hours as needed for wheezing or shortness of breath.   cholecalciferol (VITAMIN D3) 25 MCG (1000 UNIT) tablet Take 1,000 Units by mouth daily.   fluocinonide cream (LIDEX) 0.05 % Apply 1 Application topically 2 (two) times daily.   ibuprofen (ADVIL) 600 MG tablet Take 1 tablet (600 mg total) by mouth every 6 (six)  hours as needed.   ondansetron (ZOFRAN-ODT) 4 MG disintegrating tablet Take 1 tablet (4 mg total) by mouth every 8 (eight) hours as needed.   triamcinolone ointment (KENALOG) 0.5 % Apply 1 Application topically 2 (two) times daily.   No facility-administered medications prior to visit.    Review of Systems All negative Except see HPI   {Insert previous labs (optional):23779} {See past labs  Heme  Chem  Endocrine  Serology  Results Review (optional):1}   Objective    LMP 08/12/2023 (Approximate)  {Insert last BP/Wt (optional):23777}{See vitals history (optional):1}   Physical Exam   No results found for any visits on 10/14/23.      Assessment and Plan Assessment & Plan     No orders of the defined types were placed in this encounter.   No follow-ups on file.   The patient was advised to call back or seek an in-person evaluation if the symptoms worsen or if the condition fails to improve as anticipated.  I discussed the assessment and treatment plan with the patient. The patient was provided an opportunity to ask questions and all  were answered. The patient agreed with the plan and demonstrated an understanding of the instructions.  I, Jullian Previti, PA-C have reviewed all documentation for this visit. The documentation on 10/14/2023  for the exam, diagnosis, procedures, and orders are all accurate and complete.  Blane Bunting, [facility], MMS [facility] [phone] (phone) [phone] (fax)  [facility]

## 2023-10-17 ENCOUNTER — Telehealth: Payer: Self-pay

## 2023-10-22 ENCOUNTER — Encounter: Payer: Self-pay | Admitting: Dermatology

## 2023-10-22 ENCOUNTER — Ambulatory Visit (INDEPENDENT_AMBULATORY_CARE_PROVIDER_SITE_OTHER): Payer: 59 | Admitting: Dermatology

## 2023-10-22 DIAGNOSIS — L301 Dyshidrosis [pompholyx]: Secondary | ICD-10-CM

## 2023-10-22 MED ORDER — ELIDEL 1 % EX CREA: TOPICAL_CREAM | CUTANEOUS | 0 refills | Status: DC

## 2023-10-22 MED ORDER — CLOBETASOL PROPIONATE 0.05 % EX CREA
TOPICAL_CREAM | CUTANEOUS | 0 refills | Status: DC
Start: 2023-10-22 — End: 2024-02-19

## 2023-10-22 NOTE — Patient Instructions (Signed)

## 2023-10-22 NOTE — Progress Notes (Signed)
   New Patient Visit   Subjective  Deborah Lambert is a 38 y.o. female who presents for the following: B/L hands rash x 2 years, itchy, starts as small bumps/blisters, then hands start peeling. She has tried numerous prescription topical steroids including TMC 0.1% ointment and Fluocinonide  cream 0.05% which she is currently using one in the morning and one at night. Rash does not occur anywhere else on the body. Rash crusts and bleeds at times.  The patient has spots, moles and lesions to be evaluated, some may be new or changing and the patient may have concern these could be cancer.   The following portions of the chart were reviewed this encounter and updated as appropriate: medications, allergies, medical history  Review of Systems:  No other skin or systemic complaints except as noted in HPI or Assessment and Plan.  Objective  Well appearing patient in no apparent distress; mood and affect are within normal limits.   A focused examination was performed of the following areas:   Relevant exam findings are noted in the Assessment and Plan.    Assessment & Plan                Dyshidrotic eczema, chronic flaring not at goal Exam: clustered scaly vesicular plaques on palms, fingertips, lateral fingers bilaterally. 2% BSA  Atopic dermatitis (eczema) is a chronic, relapsing, pruritic condition that can significantly affect quality of life. It is often associated with allergic rhinitis and/or asthma and can require treatment with topical medications, phototherapy, or in severe cases biologic injectable medication (Dupixent; Adbry) or Oral JAK inhibitors.  Treatment Plan: Start Clobetasol  0.05% cream to aa's BID until smooth. At night apply cotton gloves. Topical steroids (such as triamcinolone , fluocinolone, fluocinonide , mometasone, clobetasol , halobetasol, betamethasone, hydrocortisone) can cause thinning and lightening of the skin if they are used for too long in the  same area. Your physician has selected the right strength medicine for your problem and area affected on the body. Please use your medication only as directed by your physician to prevent side effects.   Discussed Dupixent injections. Dupilumab (Dupixent) is a treatment given by injection for adults and children with moderate-to-severe atopic dermatitis. Goal is control of skin condition, not cure. It is given as 2 injections at the first dose followed by 1 injection ever 2 weeks thereafter.  Young children are dosed monthly.  Potential side effects include allergic reaction, herpes infections, injection site reactions and conjunctivitis (inflammation of the eyes).  The use of Dupixent requires long term medication management, including periodic office visits.  Start Elidel  cream to aa's BID. Don't get if not covered or expensive Discussed eczema triggers If remission occurs pt to count how many times until the next flare. DYSHIDROTIC ECZEMA    Return in about 4 weeks (around 11/19/2023) for dyshidrotic eczema follow up.  Arlinda Lais, CMA, am acting as scribe for Harris Liming, MD .   Documentation: I have reviewed the above documentation for accuracy and completeness, and I agree with the above.  Harris Liming, MD

## 2023-10-24 ENCOUNTER — Encounter: Payer: Self-pay | Admitting: Obstetrics and Gynecology

## 2023-10-24 ENCOUNTER — Telehealth: Payer: Self-pay

## 2023-10-24 NOTE — Telephone Encounter (Signed)
 Pt called triage stating she had a hysterectomy last month and has been losing a lot of hair. Pt advised this can be normal after any surgery. Pt wants to follow up with a female dr to have hormone levels checked. Pt transferred to front desk to get scheduled

## 2023-10-30 ENCOUNTER — Encounter: Payer: 59 | Admitting: Surgical

## 2023-11-07 ENCOUNTER — Other Ambulatory Visit: Payer: Self-pay

## 2023-11-12 ENCOUNTER — Encounter: Payer: Self-pay | Admitting: Obstetrics & Gynecology

## 2023-11-12 ENCOUNTER — Ambulatory Visit (INDEPENDENT_AMBULATORY_CARE_PROVIDER_SITE_OTHER): Admitting: Obstetrics & Gynecology

## 2023-11-12 VITALS — BP 122/89 | HR 101 | Ht 65.5 in | Wt 213.0 lb

## 2023-11-12 DIAGNOSIS — L659 Nonscarring hair loss, unspecified: Secondary | ICD-10-CM | POA: Diagnosis not present

## 2023-11-12 DIAGNOSIS — E559 Vitamin D deficiency, unspecified: Secondary | ICD-10-CM | POA: Diagnosis not present

## 2023-11-12 DIAGNOSIS — R635 Abnormal weight gain: Secondary | ICD-10-CM | POA: Diagnosis not present

## 2023-11-12 NOTE — Progress Notes (Signed)
    GYNECOLOGY PROGRESS NOTE  Subjective:    Patient ID: Deborah Lambert, female    DOB: [birth year], 38 y.o.   MRN: [id number]  HPI  Patient is a 38 y.o. [id number] here with the issues of hair loss increasing recently. She also reports weight gain. She had a LAVH/bilateral salpingectomy about 2 1/2 months ago for DUB and CIN 3. She is taking Vit D OTC for a vit d deficiency (was 45 on 08/2023). She denies hot flashes, vaginal dryness. She also reports that her mons feels more swollen or "puffy" since her hysterectomy.  The following portions of the patient's history were reviewed and updated as appropriate: allergies, current medications, past family history, past medical history, past social history, past surgical history, and problem list.  Review of Systems Pertinent items are noted in HPI.   Objective:   Blood pressure 122/89, pulse (!) 101, height 5' 5.5" (1.664 m), weight 213 lb (96.6 kg), last menstrual period 08/12/2023. Body mass index is 34.91 kg/m. Well nourished, well hydrated White female, no apparent distress She is ambulating and conversing normally. Her mons appears and feels normal.  Assessment:   1. Hair loss   2. Weight gain   3. Vitamin D  deficiency      Plan:   1. Hair loss (Primary) - I suspect that this is a natural response to the stress from major surgery and should spontaneously resolve - TSH + free T4 - FSH/LH  2. Weight gain - discussed that if labs are normal, she may need to expend more calories or take in less calories - TSH + free T4 - FSH/LH  3. Vitamin D  deficiency - she is taking OTC supplement presently - Vitamin D  (25 hydroxy)

## 2023-11-13 ENCOUNTER — Encounter: Payer: Self-pay | Admitting: Obstetrics & Gynecology

## 2023-11-13 ENCOUNTER — Other Ambulatory Visit: Payer: Self-pay | Admitting: Obstetrics & Gynecology

## 2023-11-13 DIAGNOSIS — E559 Vitamin D deficiency, unspecified: Secondary | ICD-10-CM

## 2023-11-13 LAB — TSH+FREE T4
Free T4: 1.08 ng/dL (ref 0.82–1.77)
TSH: 1.25 u[IU]/mL (ref 0.450–4.500)

## 2023-11-13 LAB — VITAMIN D 25 HYDROXY (VIT D DEFICIENCY, FRACTURES): Vit D, 25-Hydroxy: 26.4 ng/mL — ABNORMAL LOW (ref 30.0–100.0)

## 2023-11-13 LAB — FSH/LH
FSH: 4.9 m[IU]/mL
LH: 6.8 m[IU]/mL

## 2023-11-13 MED ORDER — VITAMIN D (ERGOCALCIFEROL) 1.25 MG (50000 UNIT) PO CAPS: 50000.0000 [IU] | ORAL_CAPSULE | ORAL | 0 refills | Status: DC

## 2023-11-13 NOTE — Progress Notes (Signed)
 Vit d prescribed Mychart message sent

## 2023-11-14 ENCOUNTER — Other Ambulatory Visit: Payer: Self-pay | Admitting: Physician Assistant

## 2023-11-14 DIAGNOSIS — F0781 Postconcussional syndrome: Secondary | ICD-10-CM | POA: Diagnosis not present

## 2023-11-14 DIAGNOSIS — R519 Headache, unspecified: Secondary | ICD-10-CM | POA: Diagnosis not present

## 2023-11-14 DIAGNOSIS — R200 Anesthesia of skin: Secondary | ICD-10-CM | POA: Diagnosis not present

## 2023-11-14 DIAGNOSIS — R202 Paresthesia of skin: Secondary | ICD-10-CM | POA: Diagnosis not present

## 2023-11-14 DIAGNOSIS — R479 Unspecified speech disturbances: Secondary | ICD-10-CM | POA: Diagnosis not present

## 2023-11-14 DIAGNOSIS — M5481 Occipital neuralgia: Secondary | ICD-10-CM | POA: Diagnosis not present

## 2023-11-14 DIAGNOSIS — H539 Unspecified visual disturbance: Secondary | ICD-10-CM | POA: Diagnosis not present

## 2023-11-14 DIAGNOSIS — G4489 Other headache syndrome: Secondary | ICD-10-CM

## 2023-11-15 ENCOUNTER — Encounter: Payer: Self-pay | Admitting: Oncology

## 2023-11-19 ENCOUNTER — Encounter: Payer: Self-pay | Admitting: Dermatology

## 2023-11-19 ENCOUNTER — Ambulatory Visit: Admitting: Dermatology

## 2023-11-19 MED ORDER — TACROLIMUS 0.1 % EX OINT: TOPICAL_OINTMENT | Freq: Two times a day (BID) | CUTANEOUS | 2 refills | Status: DC

## 2023-11-20 ENCOUNTER — Encounter: Payer: Self-pay | Admitting: Physician Assistant

## 2023-11-21 ENCOUNTER — Ambulatory Visit: Payer: 59 | Admitting: Internal Medicine

## 2023-11-26 ENCOUNTER — Ambulatory Visit: Payer: 59 | Admitting: Internal Medicine

## 2023-11-26 ENCOUNTER — Ambulatory Visit
Admission: RE | Admit: 2023-11-26 | Discharge: 2023-11-26 | Disposition: A | Discharge status: Home / Self Care | Source: Ambulatory Visit | Attending: Physician Assistant | Admitting: Physician Assistant

## 2023-11-26 DIAGNOSIS — R410 Disorientation, unspecified: Secondary | ICD-10-CM | POA: Diagnosis not present

## 2023-11-26 DIAGNOSIS — R479 Unspecified speech disturbances: Secondary | ICD-10-CM

## 2023-11-26 DIAGNOSIS — R519 Headache, unspecified: Secondary | ICD-10-CM | POA: Diagnosis not present

## 2023-11-26 DIAGNOSIS — G4489 Other headache syndrome: Secondary | ICD-10-CM

## 2023-11-26 DIAGNOSIS — R4789 Other speech disturbances: Secondary | ICD-10-CM | POA: Diagnosis not present

## 2023-11-26 DIAGNOSIS — R200 Anesthesia of skin: Secondary | ICD-10-CM | POA: Diagnosis not present

## 2023-11-27 ENCOUNTER — Encounter: Payer: Self-pay | Admitting: Oncology

## 2023-11-27 ENCOUNTER — Inpatient Hospital Stay: Payer: Self-pay | Attending: Oncology | Admitting: Oncology

## 2023-11-27 ENCOUNTER — Ambulatory Visit

## 2023-11-27 VITALS — BP 104/84 | HR 81 | Temp 97.3°F | Wt 194.0 lb

## 2023-11-27 DIAGNOSIS — L209 Atopic dermatitis, unspecified: Secondary | ICD-10-CM | POA: Diagnosis not present

## 2023-11-27 DIAGNOSIS — Z801 Family history of malignant neoplasm of trachea, bronchus and lung: Secondary | ICD-10-CM | POA: Insufficient documentation

## 2023-11-27 DIAGNOSIS — Z9189 Other specified personal risk factors, not elsewhere classified: Secondary | ICD-10-CM | POA: Insufficient documentation

## 2023-11-27 DIAGNOSIS — Z808 Family history of malignant neoplasm of other organs or systems: Secondary | ICD-10-CM | POA: Insufficient documentation

## 2023-11-27 DIAGNOSIS — Z803 Family history of malignant neoplasm of breast: Secondary | ICD-10-CM | POA: Insufficient documentation

## 2023-11-27 DIAGNOSIS — L2081 Atopic neurodermatitis: Secondary | ICD-10-CM

## 2023-11-27 MED ORDER — DUPILUMAB 300 MG/2ML ~~LOC~~ SOAJ
600.0000 mg | Freq: Once | SUBCUTANEOUS | Status: AC
  Administered 2023-11-27: 600 mg via SUBCUTANEOUS

## 2023-11-27 NOTE — Assessment & Plan Note (Addendum)
 Tyrer-Cuzick model showed lifetime risk of developing breast cancer of 24.2%. Continue annual bilateral screening mammogram alternating with and MRI breast with and without contrast. Patient has right arm orthopedic implant which was felt to be compatible with MRI. Obtain MRI breast with and without contrast. Discussed option of prophylactic use of tamoxifen.  She declines.

## 2023-11-27 NOTE — Assessment & Plan Note (Signed)
 nvitae gene panel was negative for high risk mutation.

## 2023-11-27 NOTE — Progress Notes (Signed)
 [facility]  Telephone:([phone]) [phone] Fax:([phone]) [phone]  ID: Deborah Lambert OB: 1985/08/11  MR#: [id number]  UUV#:[id number]  Patient Care Team: Ostwalt, Janna, PA-C as PCP - General (Physician Assistant) Patient, No Pcp Per (General Practice) Green, Davina E, RN as VBCI Care Management Green, Davina E, RN Timmy Forbes, MD as Consulting Physician (Oncology)   REASON FOR VISIT : high risk breast  ASSESSMENT & PLAN:   At high risk for breast cancer Tyrer-Cuzick model showed lifetime risk of developing breast cancer of 24.2%. Continue annual bilateral screening mammogram alternating with and MRI breast with and without contrast. Patient has right arm orthopedic implant which was felt to be compatible with MRI. Obtain MRI breast with and without contrast. Discussed option of prophylactic use of tamoxifen.  She declines.  Family history of breast cancer nvitae gene panel was negative for high risk mutation.   Orders Placed This Encounter  Procedures   MR BREAST BILATERAL W WO CONTRAST INC CAD    Standing Status:   Future    Expected Date:   12/04/2023    Expiration Date:   11/26/2024    If indicated for the ordered procedure, I authorize the administration of contrast media per Radiology protocol:   Yes    What is the patient's sedation requirement?:   No Sedation    Does the patient have a pacemaker or implanted devices?:   No    Radiology Contrast Protocol - do NOT remove file path:   [email or web address]    Preferred imaging location?:   [facility] (table limit - 550lbs)   MM 3D SCREENING MAMMOGRAM BILATERAL BREAST    Standing Status:   Future    Expected Date:   04/14/2024    Expiration Date:   11/26/2024    Reason for Exam (SYMPTOM  OR DIAGNOSIS REQUIRED):   high risk breast cancer    Preferred imaging location?:   [facility]    Is the patient pregnant?:   No    All questions were answered. The patient  knows to call the clinic with any problems, questions or concerns.  Timmy Forbes, MD, PhD [facility] Oncology 11/27/2023    HPI: Deborah Lambert is a 38 y.o. female with past medical history of anxiety, PTSD, seizure was referred to medical oncology for surveillance of high risk breast.  Patient noticed palpable masses in her bilateral breast recently.  Had diagnostic mammogram and targeted ultrasound on 04/04/2023 which showed benign left breast cyst, intradermal lesion favored to represent a sebaceous cyst at the area of palpable abnormality in the left breast.  No mammographic or sonographic abnormality noted in the right breast.  Reports she noticed another new mass in her right breast post mammogram.  Reports history of plate in her right arm for fracture.  Tells that she was told she can never have an MRI due to incompatibility.  Patient reports family history of breast cancer in her mother in her 30s.  Maternal grandmother had breast cancer under age 47s and is living in her 86s.  Maternal grandfather passed of metastatic melanoma in 107s.  Invitae gene panel was negative for high risk mutation.  The Tyrer-Cuzick model showed lifetime risk of developing breast cancer of 24.2%.  INTERVAL HISTORY Deborah Lambert is a 38 y.o. female who has above history reviewed by me today presents for follow up visit for at a risk of breast cancer. Patient previously followed up with Dr.Agrawal. Patient switched care to me on  Extensive medical record review was performed by me on 11/27/2023  Patient reports feeling well today.  Patient reports intermittent left breast tenderness.  No other new complaints.   Review of Systems  Constitutional:  Negative for appetite change, chills, fatigue and fever.  HENT:   Negative for hearing loss and voice change.   Eyes:  Negative for eye problems.  Respiratory:  Negative for chest tightness and cough.   Cardiovascular:  Negative for chest pain.   Gastrointestinal:  Negative for abdominal distention, abdominal pain and blood in stool.  Endocrine: Negative for hot flashes.  Genitourinary:  Negative for difficulty urinating and frequency.   Musculoskeletal:  Negative for arthralgias.  Skin:  Negative for itching and rash.  Neurological:  Negative for extremity weakness.  Hematological:  Negative for adenopathy.  Psychiatric/Behavioral:  Negative for confusion.    PAST MEDICAL HISTORY: Past Medical History:  Diagnosis Date   Abnormal uterine bleeding    Anemia    Anxiety    Asthma    Bell's palsy    CIN III (cervical intraepithelial neoplasia grade III) with severe dysplasia 09/02/2023   Cluster headaches    Concussion 2024   due to trauma   Dyspnea    Focal seizures (HCC)    GERD (gastroesophageal reflux disease)    Pneumonia    Pre-diabetes    PTSD (post-traumatic stress disorder)    Seizures (HCC)     PAST SURGICAL HISTORY: Past Surgical History:  Procedure Laterality Date   LAPAROSCOPIC VAGINAL HYSTERECTOMY WITH SALPINGECTOMY N/A 09/02/2023   Procedure: LAPAROSCOPIC ASSISTED VAGINAL HYSTERECTOMY WITH BILATERAL SALPINGECTOMY;  Surgeon: Teresa Fender, MD;  Location: [facility] ORS;  Service: Gynecology;  Laterality: N/A;   right arm surgery     TUBAL LIGATION      FAMILY HISTORY: Family History  Problem Relation Age of Onset   Stroke Mother    Breast cancer Mother        dx 31s, no genetic testing   Diabetes Father    Throat cancer Paternal Uncle    Breast cancer Maternal Grandmother        dx under 50   Melanoma Maternal Grandfather        metastatic, d. 38s   Breast cancer Paternal Grandmother        dx 23s   Lung cancer Paternal Grandfather     HEALTH MAINTENANCE: Social History   Tobacco Use   Smoking status: Never   Smokeless tobacco: Never  Vaping Use   Vaping status: Never Used  Substance Use Topics   Alcohol use: Never   Drug use: Never     Allergies  Allergen Reactions   Aspirin  Shortness Of Breath and Rash   Codeine Shortness Of Breath and Rash   Gold Sodium Thiomalate    Almond Oil Rash   Coconut (Cocos Nucifera) Rash    Current Outpatient Medications  Medication Sig Dispense Refill   albuterol  (VENTOLIN  HFA) 108 (90 Base) MCG/ACT inhaler Inhale 2 puffs into the lungs every 6 (six) hours as needed for wheezing or shortness of breath. Inhale into the lungs every 6 (six) hours as needed for wheezing or shortness of breath. 18 g 0   clobetasol  cream (TEMOVATE ) 0.05 % Apply to aa's hands BID PRN. Avoid applying to face, groin, and axilla. Use as directed. Long-term use can cause thinning of the skin. 45 g 0   ELIDEL  1 % cream Apply to aa's hands BID PRN. 60 g 0   gabapentin  (NEURONTIN ) 100  MG capsule Take 200 mg by mouth 2 (two) times daily.     ibuprofen  (ADVIL ) 600 MG tablet Take 1 tablet (600 mg total) by mouth every 6 (six) hours as needed. 60 tablet 1   ondansetron  (ZOFRAN -ODT) 4 MG disintegrating tablet Take 1 tablet (4 mg total) by mouth every 8 (eight) hours as needed. 20 tablet 0   tacrolimus (PROTOPIC) 0.1 % ointment Apply topically 2 (two) times daily. As needed 30 g 2   Vitamin D , Ergocalciferol , (DRISDOL ) 1.25 MG (50000 UNIT) CAPS capsule Take 1 capsule (50,000 Units total) by mouth every 7 (seven) days. 10 capsule 0   No current facility-administered medications for this visit.    OBJECTIVE: Vitals:   11/27/23 1414  BP: 104/84  Pulse: 81  Temp: (!) 97.3 F (36.3 C)  SpO2: 98%     Body mass index is 31.79 kg/m.      Physical Exam Constitutional:      General: She is not in acute distress. HENT:     Head: Normocephalic and atraumatic.  Eyes:     General: No scleral icterus. Cardiovascular:     Rate and Rhythm: Normal rate and regular rhythm.     Heart sounds: No murmur heard. Pulmonary:     Effort: Pulmonary effort is normal. No respiratory distress.     Breath sounds: Normal breath sounds. No wheezing.  Abdominal:     General: Bowel  sounds are normal. There is no distension.     Palpations: Abdomen is soft.     Tenderness: There is no abdominal tenderness.  Musculoskeletal:        General: Normal range of motion.     Cervical back: Normal range of motion and neck supple.  Skin:    General: Skin is warm and dry.     Findings: No erythema.  Neurological:     Mental Status: She is alert and oriented to person, place, and time. Mental status is at baseline.     Motor: No abnormal muscle tone.  Psychiatric:        Mood and Affect: Mood and affect normal.       LAB RESULTS:  Lab Results  Component Value Date   NA 139 09/02/2023   K 3.7 09/02/2023   CL 109 09/02/2023   CO2 22 09/02/2023   GLUCOSE 112 (H) 09/02/2023   BUN 14 09/02/2023   CREATININE 0.79 09/02/2023   CALCIUM 9.1 09/02/2023   PROT 7.5 09/02/2023   ALBUMIN 3.7 09/02/2023   AST 18 09/02/2023   ALT 21 09/02/2023   ALKPHOS 51 09/02/2023   BILITOT 0.5 09/02/2023   GFRNONAA >60 09/02/2023   GFRAA >60 06/01/2019    Lab Results  Component Value Date   WBC 6.3 08/27/2023   NEUTROABS 6.4 07/04/2023   HGB 13.7 08/27/2023   HCT 40.9 08/27/2023   MCV 87.0 08/27/2023   PLT 234 08/27/2023    Lab Results  Component Value Date   TIBC 372 07/04/2023   FERRITIN 110 07/04/2023   IRONPCTSAT 17 07/04/2023

## 2023-11-27 NOTE — Progress Notes (Signed)
 Patient here today to start Dupixent  injection for atopic dermatitis.   Dupixent  600mg  Pen (X2 300mg  pens) injected into left and right arm. Patient tolerated injections well.   LOT:  [id number] EXP: 07/01/2025 NDC: 0454-0981-19  Lisbeth Rides, RMA

## 2023-11-27 NOTE — Progress Notes (Signed)
 Pt here to establish care with Dr. Wilhelmenia Harada.

## 2023-11-28 ENCOUNTER — Telehealth: Payer: Self-pay

## 2023-11-28 NOTE — Progress Notes (Signed)
 Complex Care Management Care Guide Note  11/28/2023 Name: [name] MRN: [id number] DOB: 02/25/1986  Deborah Lambert is a 38 y.o. year old female who is a primary care patient of Ostwalt, Janna, PA-C and is actively engaged with the care management team. I reached out to Deborah Lambert by phone today to assist with re-scheduling  with the RN Case Manager.  Follow up plan: Pt declines reschedule  Lenton Rail , RMA     [place]  [facility], [facility] Guide  Direct Dial: [phone]  Website: [email or web address]

## 2023-12-02 ENCOUNTER — Ambulatory Visit: Admitting: Dermatology

## 2023-12-04 ENCOUNTER — Ambulatory Visit: Payer: Self-pay

## 2023-12-04 ENCOUNTER — Ambulatory Visit
Admission: RE | Admit: 2023-12-04 | Discharge: 2023-12-04 | Disposition: A | Discharge status: Home / Self Care | Source: Ambulatory Visit | Attending: Oncology | Admitting: Oncology

## 2023-12-04 DIAGNOSIS — Z9189 Other specified personal risk factors, not elsewhere classified: Secondary | ICD-10-CM | POA: Diagnosis present

## 2023-12-04 MED ORDER — GADOBUTROL 1 MMOL/ML IV SOLN
9.0000 mL | Freq: Once | INTRAVENOUS | Status: AC | PRN
  Administered 2023-12-04: 9 mL via INTRAVENOUS

## 2023-12-04 NOTE — Telephone Encounter (Signed)
 FYI Only or Action Required?: FYI only for provider  Patient was last seen in primary care on 07/04/2023 by Mazie Speed, MD. Called Nurse Triage reporting Insect Bite. Symptoms began several days ago. Interventions attempted: OTC medications: tylenol  for pain. Symptoms are: gradually worsening.  Triage Disposition: See Physician Within 24 Hours Patient will go to [facility]  Patient/caregiver understands and will follow disposition?: Yes          Copied from CRM [id number]. Topic: Clinical - Red Word Triage >> Dec 04, 2023 11:26 AM Emylou G wrote: [facility] that prompted transfer to Nurse Triage: patient has some sort of bug bite?  It's red, warm, itching.. and looks like a bullseye with yellow in the middle? Reason for Disposition  [1] Red or very tender (to touch) area AND [2] started over 24 hours after the bite  Answer Assessment - Initial Assessment Questions 1. TYPE of INSECT: "What type of insect was it?"      unknown 2. ONSET: "When did you get bitten?"      Two days ago 3. LOCATION: "Where is the insect bite located?"      Left thigh, inside 4. REDNESS: "Is the area red or pink?" If Yes, ask: "What size is area of redness?" (inches or cm). "When did the redness start?"     Quarter sized bullseye 5. PAIN: "Is there any pain?" If Yes, ask: "How bad is it?"  (Scale 1-10; or mild, moderate, severe)     7/10-pain 6. ITCHING: "Does it itch?" If Yes, ask: "How bad is the itch?"    - MILD: doesn't interfere with normal activities   - MODERATE-SEVERE: interferes with work, school, sleep, or other activities      Itch-severe 7. SWELLING: "How big is the swelling?" (inches, cm, or compare to coins)     Mild, "kind of puffy" 8. OTHER SYMPTOMS: "Do you have any other symptoms?"  (e.g., difficulty breathing, hives)     no   This started out like a pimple two days ago, today on the third day, there is a bullseye around the bite  Protocols used: Insect Bite-A-AH

## 2023-12-05 ENCOUNTER — Ambulatory Visit

## 2023-12-12 ENCOUNTER — Ambulatory Visit: Admitting: Dermatology

## 2023-12-12 ENCOUNTER — Encounter: Payer: Self-pay | Admitting: Dermatology

## 2023-12-12 DIAGNOSIS — Z808 Family history of malignant neoplasm of other organs or systems: Secondary | ICD-10-CM

## 2023-12-12 DIAGNOSIS — D229 Melanocytic nevi, unspecified: Secondary | ICD-10-CM

## 2023-12-12 DIAGNOSIS — L814 Other melanin hyperpigmentation: Secondary | ICD-10-CM | POA: Diagnosis not present

## 2023-12-12 DIAGNOSIS — L301 Dyshidrosis [pompholyx]: Secondary | ICD-10-CM | POA: Diagnosis not present

## 2023-12-12 DIAGNOSIS — D2261 Melanocytic nevi of right upper limb, including shoulder: Secondary | ICD-10-CM

## 2023-12-12 DIAGNOSIS — L209 Atopic dermatitis, unspecified: Secondary | ICD-10-CM | POA: Diagnosis not present

## 2023-12-12 DIAGNOSIS — D2262 Melanocytic nevi of left upper limb, including shoulder: Secondary | ICD-10-CM

## 2023-12-12 DIAGNOSIS — L2081 Atopic neurodermatitis: Secondary | ICD-10-CM

## 2023-12-12 MED ORDER — DUPILUMAB 300 MG/2ML ~~LOC~~ SOAJ
300.0000 mg | Freq: Once | SUBCUTANEOUS | Status: AC
Start: 2023-12-12 — End: 2023-12-12
  Administered 2023-12-12: 300 mg via SUBCUTANEOUS

## 2023-12-12 MED ORDER — DUPIXENT 300 MG/2ML ~~LOC~~ SOAJ: 300.0000 mg | SUBCUTANEOUS | 6 refills | Status: DC

## 2023-12-12 NOTE — Progress Notes (Addendum)
 Follow-Up Visit   Subjective  Deborah Lambert is a 38 y.o. female who presents for the following: Dyshidrotic eczema hands, ~6 wk f/u, Started Dupixent  injections 11/27/23, Clobetasol  cr prn, improved, itching improved, check brown spots arms, Maternal grandfather hx of Melanoma The patient has spots, moles and lesions to be evaluated, some may be new or changing and the patient may have concern these could be cancer.   The following portions of the chart were reviewed this encounter and updated as appropriate: medications, allergies, medical history  Review of Systems:  No other skin or systemic complaints except as noted in HPI or Assessment and Plan.  Objective  Well appearing patient in no apparent distress; mood and affect are within normal limits.   A focused examination was performed of the following areas: hands  Relevant exam findings are noted in the Assessment and Plan.    Assessment & Plan   DYSHIDROTIC ECZEMA, failed tacrolimus  and clobetasol , improving with Dupixent , severely harming quality of life hands Exam: Scaly pink papules coalescing to plaques R 5th finger. Collarettes of scale on palms 2% improved to 1% BSA on Dupixent  but has only had one dose  Chronic and persistent condition with duration or expected duration over one year. Condition is improving with treatment but not currently at goal.   Atopic dermatitis - Severe, on Dupixent  (biologic medication).  Atopic dermatitis (eczema) is a chronic, relapsing, pruritic condition that can significantly affect quality of life. It is often associated with allergic rhinitis and/or asthma and can require treatment with topical medications, phototherapy, or in severe cases biologic medications, which require long term medication management.    Treatment Plan: Cont Dupixent  300mg /36ml sq injections q 2 wks Dupixent  300mg /60ml sq injection today to L ant thigh.  Patient instructed on how to self inject and  injected herself today with no complications. Samples x 3 Lot [id number], exp 12/29/2025 Cont Clobetasol  cr qd/bid prn flares, avoid f/g/a Cont Tacrolimus  0.1% oint qd/bid  Recommend gentle skin care.   Dupilumab  (Dupixent ) is a treatment given by injection for adults and children with moderate-to-severe atopic dermatitis. Goal is control of skin condition, not cure. It is given as 2 injections at the first dose followed by 1 injection ever 2 weeks thereafter.  Young children are dosed monthly.  Potential side effects include allergic reaction, herpes infections, injection site reactions and conjunctivitis (inflammation of the eyes).  The use of Dupixent  requires long term medication management, including periodic office visits.  Topical steroids (such as triamcinolone , fluocinolone, fluocinonide , mometasone, clobetasol , halobetasol, betamethasone, hydrocortisone) can cause thinning and lightening of the skin if they are used for too long in the same area. Your physician has selected the right strength medicine for your problem and area affected on the body. Please use your medication only as directed by your physician to prevent side effects.   MELANOCYTIC NEVI/ solar lentigo arms Exam: Tan-brown and/or pink-flesh-colored symmetric macules and papules  Treatment Plan: Benign appearing on exam today. Recommend observation. Call clinic for new or changing moles. Recommend daily use of broad spectrum spf 30+ sunscreen to sun-exposed areas.   FAMILY HISTORY OF SKIN CANCER What type(s):melanoma Who affected: maternal grandfather  ATOPIC NEURODERMATITIS   Related Medications Dupilumab  SOAJ 300 mg  LENTIGINES   MULTIPLE BENIGN NEVI    Return in about 8 weeks (around 02/06/2024) for Atopic Derm.  I, Rollie Clipper, RMA, am acting as scribe for Harris Liming, MD .'   Documentation: I have reviewed the above documentation  for accuracy and completeness, and I agree with the  above.  Harris Liming, MD

## 2023-12-12 NOTE — Patient Instructions (Addendum)
 Next Dupixent  injection 12/26/23 1 pen, then on 01/09/24 inject 1 pen    Due to recent changes in healthcare laws, you may see results of your pathology and/or laboratory studies on MyChart before the doctors have had a chance to review them. We understand that in some cases there may be results that are confusing or concerning to you. Please understand that not all results are received at the same time and often the doctors may need to interpret multiple results in order to provide you with the best plan of care or course of treatment. Therefore, we ask that you please give us  2 business days to thoroughly review all your results before contacting the office for clarification. Should we see a critical lab result, you will be contacted sooner.   If You Need Anything After Your Visit  If you have any questions or concerns for your doctor, please call our main line at [phone] and press option 4 to reach your doctor's medical assistant. If no one answers, please leave a voicemail as directed and we will return your call as soon as possible. Messages left after 4 pm will be answered the following business day.   You may also send us  a message via MyChart. We typically respond to MyChart messages within 1-2 business days.  For prescription refills, please ask your pharmacy to contact our office. Our fax number is [phone].  If you have an urgent issue when the clinic is closed that cannot wait until the next business day, you can page your doctor at the number below.    Please note that while we do our best to be available for urgent issues outside of office hours, we are not available 24/7.   If you have an urgent issue and are unable to reach us , you may choose to seek medical care at your doctor's office, retail clinic, urgent care center, or emergency room.  If you have a medical emergency, please immediately call 911 or go to the emergency department.  Pager Numbers  - Dr. Bary Likes:  [phone]  - Dr. Annette Barters: [phone]  - Dr. Felipe Horton: [phone]   In the event of inclement weather, please call our main line at [phone] for an update on the status of any delays or closures.  Dermatology Medication Tips: Please keep the boxes that topical medications come in in order to help keep track of the instructions about where and how to use these. Pharmacies typically print the medication instructions only on the boxes and not directly on the medication tubes.   If your medication is too expensive, please contact our office at [phone] option 4 or send us  a message through MyChart.   We are unable to tell what your co-pay for medications will be in advance as this is different depending on your insurance coverage. However, we may be able to find a substitute medication at lower cost or fill out paperwork to get insurance to cover a needed medication.   If a prior authorization is required to get your medication covered by your insurance company, please allow us  1-2 business days to complete this process.  Drug prices often vary depending on where the prescription is filled and some pharmacies may offer cheaper prices.  The website www.goodrx.com contains coupons for medications through different pharmacies. The prices here do not account for what the cost may be with help from insurance (it may be cheaper with your insurance), but the website can give you the price if you did  not use any insurance.  - You can print the associated coupon and take it with your prescription to the pharmacy.  - You may also stop by our office during regular business hours and pick up a GoodRx coupon card.  - If you need your prescription sent electronically to a different pharmacy, notify our office through [facility] or by phone at [phone] option 4.     Si Usted Necesita Algo Despus de Su Visita  Tambin puede enviarnos un mensaje a travs de Clinical cytogeneticist. Por lo general  respondemos a los mensajes de MyChart en el transcurso de 1 a 2 das hbiles.  Para renovar recetas, por favor pida a su farmacia que se ponga en contacto con nuestra oficina. Franz Jacks de fax es [place] [phone].  Si tiene un asunto urgente cuando la clnica est cerrada y que no puede esperar hasta el siguiente da hbil, puede llamar/localizar a su doctor(a) al nmero que aparece a continuacin.   Por favor, tenga en cuenta que aunque hacemos todo lo posible para estar disponibles para asuntos urgentes fuera del horario de [place], no estamos disponibles las 24 horas del da, los 7 [address]  [address] de la [place].   Si tiene un problema urgente y no puede comunicarse con nosotros, puede optar por buscar atencin mdica  en el consultorio de su doctor(a), en una clnica privada, en un centro de atencin urgente o en una sala de emergencias.  Si tiene Engineer, drilling, por favor llame inmediatamente al 911 o vaya a la sala de emergencias.  Nmeros de bper  - Dr. Bary Likes: [phone]  - Dra. Annette Barters: [phone]  - Dr. Felipe Horton: [phone]   En caso de inclemencias del tiempo, por favor llame a Lajuan Pila principal al [phone] para una actualizacin sobre el [place] de cualquier retraso o cierre.  Consejos para la medicacin en dermatologa: Por favor, guarde las cajas en las que vienen los medicamentos de uso tpico para ayudarle a seguir las instrucciones sobre dnde y cmo usarlos. Las farmacias generalmente imprimen las instrucciones del medicamento slo en las cajas y no directamente en los tubos del [place].   Si su medicamento es muy caro, por favor, pngase en contacto con Bettyjane Brunet llamando al [phone] y presione la opcin 4 o envenos un mensaje a travs de Clinical cytogeneticist.   No podemos decirle cul ser su copago por los medicamentos por adelantado ya que esto es diferente dependiendo de la cobertura de su seguro. Sin embargo, es posible que podamos encontrar un  medicamento sustituto a Audiological scientist un formulario para que el seguro cubra el medicamento que se considera necesario.   Si se requiere una autorizacin previa para que su compaa de seguros [place] su medicamento, por favor permtanos de 1 a 2 das hbiles para completar este proceso.  Los precios de los medicamentos varan con frecuencia dependiendo del Environmental consultant de dnde se surte la receta y alguna farmacias pueden ofrecer precios ms baratos.  El sitio web www.goodrx.com tiene cupones para medicamentos de Health and safety inspector. Los precios aqu no tienen en cuenta lo que podra costar con la ayuda del seguro (puede ser ms barato con su seguro), pero el sitio web puede darle el precio si no utiliz Tourist information centre manager.  - Puede imprimir el cupn correspondiente y llevarlo con su receta a la farmacia.  - Tambin puede pasar por nuestra oficina durante el horario de atencin regular y Education officer, museum una tarjeta de cupones de GoodRx.  - Si necesita que su receta se  enve electrnicamente a una farmacia diferente, informe a nuestra oficina a travs de MyChart de [place] o por telfono llamando al [phone] y presione la opcin 4.

## 2023-12-18 ENCOUNTER — Ambulatory Visit (INDEPENDENT_AMBULATORY_CARE_PROVIDER_SITE_OTHER): Admitting: Family Medicine

## 2023-12-18 ENCOUNTER — Ambulatory Visit
Admission: RE | Admit: 2023-12-18 | Discharge: 2023-12-18 | Disposition: A | Discharge status: Home / Self Care | Source: Ambulatory Visit | Attending: Family Medicine

## 2023-12-18 ENCOUNTER — Encounter: Payer: Self-pay | Admitting: Family Medicine

## 2023-12-18 ENCOUNTER — Ambulatory Visit: Payer: Self-pay | Admitting: Family Medicine

## 2023-12-18 ENCOUNTER — Ambulatory Visit
Admission: RE | Admit: 2023-12-18 | Discharge: 2023-12-18 | Disposition: A | Discharge status: Home / Self Care | Attending: Family Medicine | Admitting: Family Medicine

## 2023-12-18 VITALS — BP 108/70 | HR 74 | Ht 65.0 in | Wt 194.0 lb

## 2023-12-18 DIAGNOSIS — M25471 Effusion, right ankle: Secondary | ICD-10-CM

## 2023-12-18 DIAGNOSIS — M25572 Pain in left ankle and joints of left foot: Secondary | ICD-10-CM

## 2023-12-18 MED ORDER — MELOXICAM 7.5 MG PO TABS: 7.5000 mg | ORAL_TABLET | Freq: Every day | ORAL | 0 refills | Status: AC
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
## 2023-12-18 NOTE — Patient Instructions (Signed)
 Please report to [facility] located at:  [address]  [place], [place] [address]  You do not need an appointment to have xrays completed.   Our office will follow up with  results once available.

## 2023-12-19 NOTE — Telephone Encounter (Signed)
 Please see the message below.

## 2023-12-26 ENCOUNTER — Other Ambulatory Visit: Payer: Self-pay | Admitting: Family Medicine

## 2023-12-26 DIAGNOSIS — M25572 Pain in left ankle and joints of left foot: Secondary | ICD-10-CM

## 2023-12-26 DIAGNOSIS — M25471 Effusion, right ankle: Secondary | ICD-10-CM

## 2023-12-27 ENCOUNTER — Ambulatory Visit (INDEPENDENT_AMBULATORY_CARE_PROVIDER_SITE_OTHER)

## 2023-12-27 ENCOUNTER — Ambulatory Visit: Admitting: Podiatry

## 2023-12-27 ENCOUNTER — Encounter: Payer: Self-pay | Admitting: Podiatry

## 2023-12-27 DIAGNOSIS — M2141 Flat foot [pes planus] (acquired), right foot: Secondary | ICD-10-CM

## 2023-12-27 DIAGNOSIS — M2142 Flat foot [pes planus] (acquired), left foot: Secondary | ICD-10-CM | POA: Diagnosis not present

## 2023-12-27 DIAGNOSIS — M7751 Other enthesopathy of right foot: Secondary | ICD-10-CM | POA: Diagnosis not present

## 2023-12-27 DIAGNOSIS — M7752 Other enthesopathy of left foot: Secondary | ICD-10-CM | POA: Diagnosis not present

## 2023-12-27 NOTE — Progress Notes (Signed)
   Chief Complaint  Patient presents with   Ankle Pain    Pt is here due to due to bilateral ankle pain that has been going on for over 2 months no injury to either ankle, seen another doctor for this issue and was recommended to come here.    HPI: 38 y.o. female presenting today as a new patient for evaluation of bilateral foot and ankle pain ongoing for several months.  She admits to walking around the house barefoot the majority of the day.  She presents for further treatment evaluation  Past Medical History:  Diagnosis Date   Abnormal uterine bleeding    Anemia    Anxiety    Asthma    Bell's palsy    CIN III (cervical intraepithelial neoplasia grade III) with severe dysplasia 09/02/2023   Cluster headaches    Concussion 2024   due to trauma   Dyspnea    Focal seizures (HCC)    GERD (gastroesophageal reflux disease)    Pneumonia    Pre-diabetes    PTSD (post-traumatic stress disorder)    Seizures (HCC)     Past Surgical History:  Procedure Laterality Date   LAPAROSCOPIC VAGINAL HYSTERECTOMY WITH SALPINGECTOMY N/A 09/02/2023   Procedure: LAPAROSCOPIC ASSISTED VAGINAL HYSTERECTOMY WITH BILATERAL SALPINGECTOMY;  Surgeon: Connell Davies, MD;  Location: [facility] ORS;  Service: Gynecology;  Laterality: N/A;   right arm surgery     TUBAL LIGATION      Allergies  Allergen Reactions   Aspirin Shortness Of Breath and Rash   Codeine Shortness Of Breath and Rash   Gold Sodium Thiomalate    Almond Oil Rash   Coconut (Cocos Nucifera) Rash     Physical Exam: General: The patient is alert and oriented x3 in no acute distress.  Dermatology: Skin is warm, dry and supple bilateral lower extremities.   Vascular: Palpable pedal pulses bilaterally. Capillary refill within normal limits.  No appreciable edema.  No erythema.  Neurological: Grossly intact via light touch  Musculoskeletal Exam: With weightbearing there is complete collapse of the medial longitudinal arch of the foot with  rearfoot valgus rotation of the heel.   Radiographic Exam B/L ankle 12/27/2023:  Normal osseous mineralization. Joint spaces preserved.  No fractures or osseous irregularities noted.  Impression: Negative  Assessment/Plan of Care: 1.  Pes planovalgus deformity bilateral  -Patient evaluated.  X-rays reviewed -Stressed the importance of wearing good supportive tennis shoes with arch supports to support the medial longitudinal arch of the foot.  Advised against going barefoot -Continue anti-inflammatories prescribed by PCP -Return to clinic as needed       Thresa EMERSON Sar, DPM [facility]  Dr. Thresa EMERSON Sar, DPM    [address] [place], [place] [address]                Office [phone]  Fax [phone]

## 2024-01-01 ENCOUNTER — Telehealth: Payer: Self-pay | Admitting: Family Medicine

## 2024-01-01 NOTE — Telephone Encounter (Signed)
Please see the message below and advise.

## 2024-01-01 NOTE — Telephone Encounter (Signed)
 Copied from CRM [id number]. Topic: General - Other >> Jan 01, 2024  1:12 PM Emylou G wrote: Reason for CRM: Patient called.. wants to know how she can obtain emotional support: So that her dog is helping her as a emotional support.

## 2024-01-02 ENCOUNTER — Encounter: Payer: Self-pay | Admitting: Dermatology

## 2024-01-02 NOTE — Telephone Encounter (Signed)
 PLEASE SCHEDULE AN APPOINTMENT TO DISCUSS, PT WILL LIKELY NEED PSYCHIATRY REFERRAL

## 2024-01-06 ENCOUNTER — Ambulatory Visit: Payer: Self-pay

## 2024-01-06 NOTE — Telephone Encounter (Signed)
 FYI Only or Action Required?: FYI only for provider.  Patient was last seen in primary care on 12/18/2023 by Sharma Coyer, MD. Called Nurse Triage reporting Insect Bite. Symptoms began yesterday. Interventions attempted: Rest, hydration, or home remedies. Symptoms are: unchanged.  Triage Disposition: See PCP When Office is Open (Within 3 Days)  Patient/caregiver understands and will follow disposition?: Yes    Copied from CRM [id number]. Topic: Clinical - Red Word Triage >> Jan 06, 2024 10:58 AM Carlatta H wrote: [facility] that prompted transfer to Nurse Triage: Patient has a bite on inner thigh that purple red with a white center// Reason for Disposition  [1] SEVERE local itching (i.e., interferes with work, school, sleep) AND [2] not improved after 24 hours of hydrocortisone cream  Answer Assessment - Initial Assessment Questions 1. TYPE of INSECT: What type of insect was it?      unknown 2. ONSET: When did you get bitten?      yesterday 3. LOCATION: Where is the insect bite located?      Inner thigh - L  4. REDNESS: Is the area red or pink? If Yes, ask: What size is area of redness? (inches or cm). When did the redness start?     Red purple, dime size 5. PAIN: Is there any pain? If Yes, ask: How bad is it?  (Scale 1-10; or mild, moderate, severe)     denies 6. ITCHING: Does it itch? If Yes, ask: How bad is the itch?    - MILD: doesn't interfere with normal activities   - MODERATE-SEVERE: interferes with work, school, sleep, or other activities      mild 7. SWELLING: How big is the swelling? (inches, cm, or compare to coins)     denies 8. OTHER SYMPTOMS: Do you have any other symptoms?  (e.g., difficulty breathing, hives)     denies 9. PREGNANCY: Is there any chance you are pregnant? When was your last menstrual period?     N/a    Pt requesting acute appt, and will cancel acute appt if sx improve.  Protocols used: Insect Bite-A-AH

## 2024-01-06 NOTE — Telephone Encounter (Signed)
 Called and spoke to the pt, she in regards to her animal emotional support request. She has been scheduled for 7/15

## 2024-01-06 NOTE — Telephone Encounter (Signed)
 Spoke with patient this morning and she was provided with [employer]'s phone number to call for shipping update. aw

## 2024-01-08 ENCOUNTER — Ambulatory Visit: Admitting: Physician Assistant

## 2024-01-13 DIAGNOSIS — M65979 Unspecified synovitis and tenosynovitis, unspecified ankle and foot: Secondary | ICD-10-CM | POA: Diagnosis not present

## 2024-01-13 DIAGNOSIS — I493 Ventricular premature depolarization: Secondary | ICD-10-CM | POA: Diagnosis not present

## 2024-01-13 DIAGNOSIS — M25572 Pain in left ankle and joints of left foot: Secondary | ICD-10-CM | POA: Diagnosis not present

## 2024-01-14 ENCOUNTER — Encounter: Payer: Self-pay | Admitting: Family Medicine

## 2024-01-14 ENCOUNTER — Ambulatory Visit (INDEPENDENT_AMBULATORY_CARE_PROVIDER_SITE_OTHER): Admitting: Family Medicine

## 2024-01-14 VITALS — BP 114/84 | HR 77 | Ht 65.0 in | Wt 211.0 lb

## 2024-01-14 DIAGNOSIS — F4310 Post-traumatic stress disorder, unspecified: Secondary | ICD-10-CM | POA: Diagnosis not present

## 2024-01-14 DIAGNOSIS — F3160 Bipolar disorder, current episode mixed, unspecified: Secondary | ICD-10-CM | POA: Insufficient documentation

## 2024-01-14 DIAGNOSIS — Z87898 Personal history of other specified conditions: Secondary | ICD-10-CM

## 2024-01-14 DIAGNOSIS — F419 Anxiety disorder, unspecified: Secondary | ICD-10-CM | POA: Diagnosis not present

## 2024-01-14 MED ORDER — PRAZOSIN HCL 1 MG PO CAPS: 1.0000 mg | ORAL_CAPSULE | Freq: Every day | ORAL | 1 refills | Status: DC
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
## 2024-01-14 NOTE — Assessment & Plan Note (Signed)
 Chronic, symptoms are not controlled  Diagnosed with bipolar disorder, specific type unknown. Experiences mood instability, including episodes of rage and emotional dysregulation. Previous psychiatric medications were unhelpful, and she is not currently on any bipolar disorder medication. A psychiatric evaluation is recommended to assess current mood symptoms and consider treatment options. - Recommend psychiatric evaluation to assess current mood symptoms and consider treatment options. -Psych referral submitted  - not currently on any psychiatric prescriptions

## 2024-01-20 ENCOUNTER — Encounter: Payer: Self-pay | Admitting: Certified Nurse Midwife

## 2024-01-20 ENCOUNTER — Ambulatory Visit (INDEPENDENT_AMBULATORY_CARE_PROVIDER_SITE_OTHER): Admitting: Certified Nurse Midwife

## 2024-01-20 VITALS — BP 115/79 | HR 84 | Ht 65.0 in | Wt 195.0 lb

## 2024-01-20 DIAGNOSIS — R4586 Emotional lability: Secondary | ICD-10-CM

## 2024-01-20 DIAGNOSIS — Z1331 Encounter for screening for depression: Secondary | ICD-10-CM | POA: Diagnosis not present

## 2024-01-20 DIAGNOSIS — Z1329 Encounter for screening for other suspected endocrine disorder: Secondary | ICD-10-CM

## 2024-01-20 DIAGNOSIS — E559 Vitamin D deficiency, unspecified: Secondary | ICD-10-CM | POA: Diagnosis not present

## 2024-01-20 DIAGNOSIS — Z90710 Acquired absence of both cervix and uterus: Secondary | ICD-10-CM | POA: Diagnosis not present

## 2024-01-20 DIAGNOSIS — R454 Irritability and anger: Secondary | ICD-10-CM | POA: Diagnosis not present

## 2024-01-20 DIAGNOSIS — R102 Pelvic and perineal pain: Secondary | ICD-10-CM

## 2024-01-20 DIAGNOSIS — Z862 Personal history of diseases of the blood and blood-forming organs and certain disorders involving the immune mechanism: Secondary | ICD-10-CM | POA: Diagnosis not present

## 2024-01-20 NOTE — Progress Notes (Signed)
 Lambert, Rockie, MD   Chief Complaint  Patient presents with   Mood changes & abdominal pain since hysterectomy    HPI:      Deborah Lambert is a 38 y.o. [id number] whose LMP was Patient's last menstrual period was 08/12/2023 (approximate)., presents today for changes to her mood as well as lower abdominal pain. These changes started after her hysterectomy about 4 months ago. Hysterectomy performed due to irregular & frequent menses as well as CIN III. Salpingectomy performed, ovaries retained.  Reports that lower abdominal pain is random with shooting pain down into pelvis/vagina as well as lower abdominal aching.  Menopause in her family is usually around 44. Has noted irritability, rage episodes and then crying minutes later, denies cyclic nature. Has also noted weight gain & hair loss. She was seen in May & had FSH/LH drawn and was found to have low vitamin D . History of anemia & low B12.  Bipolar & PTSD in her history, on prasozin for sleep due to PTSD. Has not been on medication for bipolar. Denies history of SI or hospitalization for mood disorder.     Patient Active Problem List   Diagnosis Date Noted   Bipolar disorder, mixed (HCC) 01/14/2024   CIN III (cervical intraepithelial neoplasia grade III) with severe dysplasia 09/02/2023   COVID-19 07/17/2023   History of trauma 06/14/2023   Well woman exam with routine gynecological exam 06/14/2023   Atopic dermatitis 05/24/2023   Moderate persistent asthma, uncomplicated 05/24/2023   Acute stress reaction 05/13/2023   Postconcussion syndrome 05/08/2023   At high risk for breast cancer 04/26/2023   Family history of breast cancer 04/26/2023   Family history of breast cancer gene mutation in first degree relative 04/26/2023   Genetic testing 04/16/2023   Generalized edema 11/12/2022   Dizziness 11/12/2022   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/12/2022   Obesity (BMI 30-39.9) 10/31/2022   Anxiety 10/31/2022    Uncomplicated asthma 10/31/2022   Eczema of both hands 10/31/2022   PTSD (post-traumatic stress disorder) 10/31/2022   Seasonal allergies 10/31/2022   Depression, recurrent (HCC) 10/31/2022    Past Surgical History:  Procedure Laterality Date   LAPAROSCOPIC VAGINAL HYSTERECTOMY WITH SALPINGECTOMY N/A 09/02/2023   Procedure: LAPAROSCOPIC ASSISTED VAGINAL HYSTERECTOMY WITH BILATERAL SALPINGECTOMY;  Surgeon: Connell Davies, MD;  Location: [facility] ORS;  Service: Gynecology;  Laterality: N/A;   right arm surgery     TUBAL LIGATION      Family History  Problem Relation Age of Onset   Stroke Mother    Breast cancer Mother        dx 88s, no genetic testing   Diabetes Father    Throat cancer Paternal Uncle    Breast cancer Maternal Grandmother        dx under 50   Melanoma Maternal Grandfather        metastatic, d. 56s   Breast cancer Paternal Grandmother        dx 43s   Lung cancer Paternal Grandfather     Social History   Socioeconomic History   Marital status: Married    Spouse name: Vanwey,JAMES (Spouse)   Number of children: Not on file   Years of education: Not on file   Highest education level: 11th grade  Occupational History   Not on file  Tobacco Use   Smoking status: Never   Smokeless tobacco: Never  Vaping Use   Vaping status: Never Used  Substance and Sexual Activity   Alcohol use: Never  Drug use: Never   Sexual activity: Not Currently    Birth control/protection: Surgical  Other Topics Concern   Not on file  Social History Narrative   ** Merged History Encounter **       Pt have 2 kids.   Social Drivers of Health   Financial Resource Strain: Medium Risk (01/13/2024)   Received from [facility]   Overall Financial Resource Strain (CARDIA)    Difficulty of Paying Living Expenses: Somewhat hard  Food Insecurity: Food Insecurity Present (01/13/2024)   Received from [facility]   Hunger Vital Sign    Within the past  12 months, you worried that your food would run out before you got the money to buy more.: Sometimes true    Within the past 12 months, the food you bought just didn't last and you didn't have money to get more.: Often true  Transportation Needs: No Transportation Needs (01/13/2024)   Received from [facility] - Transportation    In the past 12 months, has lack of transportation kept you from medical appointments or from getting medications?: No    Lack of Transportation (Non-Medical): No  Physical Activity: Sufficiently Active (12/17/2023)   Exercise Vital Sign    Days of Exercise per Week: 7 days    Minutes of Exercise per Session: 30 min  Stress: Stress Concern Present (12/17/2023)   [employer] of Occupational Health - Occupational Stress Questionnaire    Feeling of Stress: To some extent  Social Connections: Moderately Isolated (12/17/2023)   Social Connection and Isolation Panel    Frequency of Communication with Friends and Family: Three times a week    Frequency of Social Gatherings with Friends and Family: Never    Attends Religious Services: Never    Database administrator or Organizations: No    Attends Engineer, structural: Not on file    Marital Status: Married  Catering manager Violence: Not At Risk (10/07/2023)   Humiliation, Afraid, Rape, and Kick questionnaire    Fear of Current or Ex-Partner: No    Emotionally Abused: No    Physically Abused: No    Sexually Abused: No    Outpatient Medications Prior to Visit  Medication Sig Dispense Refill   albuterol  (VENTOLIN  HFA) 108 (90 Base) MCG/ACT inhaler Inhale 2 puffs into the lungs every 6 (six) hours as needed for wheezing or shortness of breath. Inhale into the lungs every 6 (six) hours as needed for wheezing or shortness of breath. 18 g 0   celecoxib (CELEBREX) 200 MG capsule Take 200 mg by mouth.     clobetasol  cream (TEMOVATE ) 0.05 % Apply to aa's hands BID PRN. Avoid applying to  face, groin, and axilla. Use as directed. Long-term use can cause thinning of the skin. 45 g 0   Dupilumab  (DUPIXENT ) 300 MG/2ML SOAJ Inject 300 mg into the skin every 14 (fourteen) days. Starting at day 15 for maintenance. 4 mL 6   ELIDEL  1 % cream Apply to aa's hands BID PRN. 60 g 0   ibuprofen  (ADVIL ) 600 MG tablet Take 1 tablet (600 mg total) by mouth every 6 (six) hours as needed. 60 tablet 1   meloxicam  (MOBIC ) 7.5 MG tablet Take 1-2 tablets (7.5-15 mg total) by mouth daily. (Patient taking differently: Take 7.5-15 mg by mouth as needed.) 60 tablet 0   ondansetron  (ZOFRAN -ODT) 4 MG disintegrating tablet Take 1 tablet (4 mg total) by mouth every 8 (  eight) hours as needed. 20 tablet 0   prazosin  (MINIPRESS ) 1 MG capsule Take 1 capsule (1 mg total) by mouth at bedtime. 90 capsule 1   tacrolimus  (PROTOPIC ) 0.1 % ointment Apply topically 2 (two) times daily. As needed (Patient taking differently: Apply topically as needed. As needed) 30 g 2   gabapentin  (NEURONTIN ) 100 MG capsule Take 200 mg by mouth 2 (two) times daily. (Patient not taking: Reported on 01/20/2024)     Vitamin D , Ergocalciferol , (DRISDOL ) 1.25 MG (50000 UNIT) CAPS capsule Take 1 capsule (50,000 Units total) by mouth every 7 (seven) days. (Patient not taking: Reported on 01/20/2024) 10 capsule 0   No facility-administered medications prior to visit.      01/20/2024   11:18 AM 01/14/2024   10:53 AM 10/07/2023    2:41 PM 09/05/2023   12:30 PM 07/04/2023    3:03 PM  Depression screen PHQ 2/9  Decreased Interest 0 0 1 0 0  Down, Depressed, Hopeless 1 1 1  0 0  PHQ - 2 Score 1 1 2  0 0  Altered sleeping 0 0 2  2  Tired, decreased energy 1 1 1  2   Change in appetite 1 0 3  0  Feeling bad or failure about yourself  1 0 2  0  Trouble concentrating 0 0 1  0  Moving slowly or fidgety/restless 0 0 2  0  Suicidal thoughts 0 0 0  0  PHQ-9 Score 4 2 13  4   Difficult doing work/chores Somewhat difficult Not difficult at all Somewhat difficult   Somewhat difficult      01/20/2024   11:19 AM 01/14/2024   10:54 AM 10/07/2023    4:05 PM 10/07/2023    2:45 PM  GAD 7 : Generalized Anxiety Score  Nervous, Anxious, on Edge 0 1  1  Control/stop worrying 1 1  1   Worry too much - different things 0 0  1  Trouble relaxing 1 0  1  Restless 0 0  3  Easily annoyed or irritable 2 3  3   Afraid - awful might happen 0 0  1  Total GAD 7 Score 4 5  11   Anxiety Difficulty Somewhat difficult Not difficult at all Somewhat difficult       ROS:  Review of Systems  Constitutional:  Positive for fatigue and unexpected weight change.  Gastrointestinal:  Positive for abdominal pain.  Genitourinary:  Positive for pelvic pain.  Psychiatric/Behavioral:  Positive for agitation and sleep disturbance. Negative for suicidal ideas.      OBJECTIVE:   Vitals:  BP 115/79   Pulse 84   Ht 5' 5 (1.651 m)   Wt 195 lb (88.5 kg)   LMP 08/12/2023 (Approximate)   BMI 32.45 kg/m   Physical Exam Constitutional:      General: She is not in acute distress.    Appearance: Normal appearance.  Cardiovascular:     Rate and Rhythm: Normal rate.  Pulmonary:     Effort: Pulmonary effort is normal.  Abdominal:     Palpations: Abdomen is soft.     Tenderness: There is abdominal tenderness in the suprapubic area and left lower quadrant. There is guarding.  Skin:    General: Skin is warm and dry.  Neurological:     General: No focal deficit present.     Mental Status: She is alert and oriented to person, place, and time.  Psychiatric:        Attention and Perception: Attention and  perception normal.        Mood and Affect: Affect is blunt, flat and tearful.        Speech: Speech normal.        Behavior: Behavior normal. Behavior is cooperative.        Thought Content: Thought content normal.        Judgment: Judgment normal.     Results: No results found for this or any previous visit (from the past 24 hours).   Assessment/Plan: Suprapubic pain -  Plan: Urine Culture  S/P laparoscopic hysterectomy - Plan: Urine Culture  Vitamin D  deficiency - Plan: TSH+T4F+T3Free+ThyAbs+TPO+VD25  Screening for thyroid  disorder - Plan: TSH+T4F+T3Free+ThyAbs+TPO+VD25  History of anemia - Plan: CBC, Ferritin, Vitamin B12  Irritability - Plan: CBC, Ferritin, FSH/LH, Estradiol , Testosterone , Free, Total, SHBG, TSH+T4F+T3Free+ThyAbs+TPO+VD25, Progesterone , Vitamin B12  Labile mood - Plan: CBC, Ferritin, FSH/LH, Estradiol , Testosterone , Free, Total, SHBG, TSH+T4F+T3Free+ThyAbs+TPO+VD25, Progesterone , Vitamin B12   Will recheck Vitamin D , FSH/LH given continued & increased symptoms in addition to testosterone  & thyroid  panel with B12 & CBC. Reviewed possibility of hormonal imbalance due to recent surgery, however ovaries remain and should continue to be releasing hormones. She has not found counseling or medication for mood helpful in the past. Urine culture collected for suprapubic pain.   No orders of the defined types were placed in this encounter.    Harlene LITTIE Cisco, CNM 01/20/2024 11:04 AM

## 2024-01-21 LAB — CBC
Hematocrit: 42.5 % (ref 34.0–46.6)
Hemoglobin: 14.3 g/dL (ref 11.1–15.9)
MCH: 30.4 pg (ref 26.6–33.0)
MCHC: 33.6 g/dL (ref 31.5–35.7)
MCV: 90 fL (ref 79–97)
Platelets: 232 x10E3/uL (ref 150–450)
RBC: 4.71 x10E6/uL (ref 3.77–5.28)
RDW: 12.4 % (ref 11.7–15.4)
WBC: 8 x10E3/uL (ref 3.4–10.8)

## 2024-01-21 LAB — TSH+T4F+T3FREE+THYABS+TPO+VD25
Free T4: 1.13 ng/dL (ref 0.82–1.77)
T3, Free: 2.5 pg/mL (ref 2.0–4.4)
TSH: 1.59 u[IU]/mL (ref 0.450–4.500)
Thyroglobulin Antibody: <1 [IU]/mL (ref 0.0–0.9)
Thyroperoxidase Ab SerPl-aCnc: 20 [IU]/mL (ref 0–34)
Vit D, 25-Hydroxy: 38.1 ng/mL (ref 30.0–100.0)

## 2024-01-21 LAB — ESTRADIOL: Estradiol: 203 pg/mL

## 2024-01-21 LAB — PROGESTERONE: Progesterone: 2.3 ng/mL

## 2024-01-21 LAB — TESTOSTERONE, FREE, TOTAL, SHBG
Sex Hormone Binding: 52.3 nmol/L (ref 24.6–122.0)
Testosterone, Free: 0.3 pg/mL (ref 0.0–4.2)
Testosterone: 15 ng/dL (ref 8–60)

## 2024-01-21 LAB — VITAMIN B12: Vitamin B-12: 532 pg/mL (ref 232–1245)

## 2024-01-21 LAB — FSH/LH
FSH: 3.8 m[IU]/mL
LH: 8.6 m[IU]/mL

## 2024-01-21 LAB — FERRITIN: Ferritin: 93 ng/mL (ref 15–150)

## 2024-01-22 LAB — URINE CULTURE

## 2024-01-24 ENCOUNTER — Ambulatory Visit: Payer: Self-pay | Admitting: Certified Nurse Midwife

## 2024-01-24 ENCOUNTER — Encounter: Payer: Self-pay | Admitting: Certified Nurse Midwife

## 2024-02-02 ENCOUNTER — Encounter: Payer: Self-pay | Admitting: Certified Nurse Midwife

## 2024-02-06 ENCOUNTER — Ambulatory Visit: Admitting: Dermatology

## 2024-02-15 ENCOUNTER — Ambulatory Visit (INDEPENDENT_AMBULATORY_CARE_PROVIDER_SITE_OTHER)

## 2024-02-15 ENCOUNTER — Ambulatory Visit: Payer: Self-pay | Admitting: Physician Assistant

## 2024-02-15 ENCOUNTER — Ambulatory Visit
Admission: EM | Admit: 2024-02-15 | Discharge: 2024-02-15 | Disposition: A | Discharge status: Home / Self Care | Attending: Physician Assistant | Admitting: Physician Assistant

## 2024-02-15 ENCOUNTER — Encounter: Payer: Self-pay | Admitting: Emergency Medicine

## 2024-02-15 DIAGNOSIS — M25512 Pain in left shoulder: Secondary | ICD-10-CM | POA: Diagnosis not present

## 2024-02-15 DIAGNOSIS — R079 Chest pain, unspecified: Secondary | ICD-10-CM

## 2024-02-15 DIAGNOSIS — R0602 Shortness of breath: Secondary | ICD-10-CM | POA: Diagnosis not present

## 2024-02-15 MED ORDER — TIZANIDINE HCL 4 MG PO TABS: 4.0000 mg | ORAL_TABLET | Freq: Every evening | ORAL | 0 refills | Status: AC | PRN

## 2024-02-15 MED ORDER — KETOROLAC TROMETHAMINE 60 MG/2ML IM SOLN
30.0000 mg | Freq: Once | INTRAMUSCULAR | Status: AC
  Administered 2024-02-15: 30 mg via INTRAMUSCULAR

## 2024-02-15 MED ORDER — NAPROXEN 500 MG PO TABS: 500.0000 mg | ORAL_TABLET | Freq: Two times a day (BID) | ORAL | 0 refills | Status: AC

## 2024-02-15 NOTE — Discharge Instructions (Addendum)
-  EKG and chest xray look good -Your symptoms are due to musculoskeletal  pain -We gave you  ketorolac  in clinic and I prescribed Naproxen  and tizanidine  -If worsening pain please go to ER

## 2024-02-15 NOTE — ED Provider Notes (Signed)
 [facility]    CSN: [id number] Arrival date & time: 02/15/24  1142      History   Chief Complaint Chief Complaint  Patient presents with   Back Pain   Chest Pain    HPI Julyanna Lambert is a 38 y.o. female with history of anxiety, PTSD, asthma, seizures, GERD, and prediabetes presenting for left shoulder, left upper arm and left chest wall pain x 1 week. Reports pain is worse with touching the area, standing up, laying on the left side, and raising left arm.  No injury.  Patient also denies any changes to her physical activity.  She is not employed and has not recently done any heavy lifting.  She does report feeling a little short of breath at times but denies any wheezing, cough, congestion, fever or recent illness.  She has no associated numbness/tingling or weakness.  Patient has tried ibuprofen  without relief. No history of PE/DVT.  HPI  Past Medical History:  Diagnosis Date   Abnormal uterine bleeding    Anemia    Anxiety    Asthma    Bell's palsy    CIN III (cervical intraepithelial neoplasia grade III) with severe dysplasia 09/02/2023   Cluster headaches    Concussion 2024   due to trauma   Dyspnea    Focal seizures (HCC)    GERD (gastroesophageal reflux disease)    Pneumonia    Pre-diabetes    PTSD (post-traumatic stress disorder)    Seizures (HCC)     Patient Active Problem List   Diagnosis Date Noted   Bipolar disorder, mixed (HCC) 01/14/2024   CIN III (cervical intraepithelial neoplasia grade III) with severe dysplasia 09/02/2023   COVID-19 07/17/2023   History of trauma 06/14/2023   Well woman exam with routine gynecological exam 06/14/2023   Atopic dermatitis 05/24/2023   Moderate persistent asthma, uncomplicated 05/24/2023   Acute stress reaction 05/13/2023   Postconcussion syndrome 05/08/2023   At high risk for breast cancer 04/26/2023   Family history of breast cancer 04/26/2023   Family history of breast cancer gene mutation in  first degree relative 04/26/2023   Genetic testing 04/16/2023   Generalized edema 11/12/2022   Dizziness 11/12/2022   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/12/2022   Obesity (BMI 30-39.9) 10/31/2022   Anxiety 10/31/2022   Uncomplicated asthma 10/31/2022   Eczema of both hands 10/31/2022   PTSD (post-traumatic stress disorder) 10/31/2022   Seasonal allergies 10/31/2022   Depression, recurrent (HCC) 10/31/2022    Past Surgical History:  Procedure Laterality Date   LAPAROSCOPIC VAGINAL HYSTERECTOMY WITH SALPINGECTOMY N/A 09/02/2023   Procedure: LAPAROSCOPIC ASSISTED VAGINAL HYSTERECTOMY WITH BILATERAL SALPINGECTOMY;  Surgeon: Connell Davies, MD;  Location: [facility] ORS;  Service: Gynecology;  Laterality: N/A;   right arm surgery     TUBAL LIGATION      OB History     Gravida  2   Para  2   Term  2   Preterm      AB      Living  2      SAB      IAB      Ectopic      Multiple      Live Births  2            Home Medications    Prior to Admission medications   Medication Sig Start Date End Date Taking? Authorizing Provider  Dupilumab  (DUPIXENT ) 300 MG/2ML SOAJ Inject 300 mg into the skin every 14 (  fourteen) days. Starting at day 15 for maintenance. 12/12/23  Yes Claudene Lehmann, MD  meloxicam  (MOBIC ) 7.5 MG tablet Take 1-2 tablets (7.5-15 mg total) by mouth daily. Patient taking differently: Take 7.5-15 mg by mouth as needed. 12/18/23  Yes Simmons-Robinson, Makiera, MD  naproxen  (NAPROSYN ) 500 MG tablet Take 1 tablet (500 mg total) by mouth 2 (two) times daily. 02/15/24  Yes Arvis Huxley B, PA-C  tiZANidine  (ZANAFLEX ) 4 MG tablet Take 1 tablet (4 mg total) by mouth at bedtime as needed for up to 10 days for muscle spasms. 02/15/24 02/25/24 Yes Arvis Huxley NOVAK, PA-C  albuterol  (VENTOLIN  HFA) 108 (90 Base) MCG/ACT inhaler Inhale 2 puffs into the lungs every 6 (six) hours as needed for wheezing or shortness of breath. Inhale into the lungs every 6 (six) hours as  needed for wheezing or shortness of breath. 10/31/22   Ostwalt, Janna, PA-C  clobetasol  cream (TEMOVATE ) 0.05 % Apply to aa's hands BID PRN. Avoid applying to face, groin, and axilla. Use as directed. Long-term use can cause thinning of the skin. 10/22/23   Claudene Lehmann, MD  ELIDEL  1 % cream Apply to aa's hands BID PRN. 10/22/23   Claudene Lehmann, MD  gabapentin  (NEURONTIN ) 100 MG capsule Take 200 mg by mouth 2 (two) times daily. Patient not taking: Reported on 01/20/2024 11/14/23   [provider]  ibuprofen  (ADVIL ) 600 MG tablet Take 1 tablet (600 mg total) by mouth every 6 (six) hours as needed. 09/02/23   Connell Davies, MD  ondansetron  (ZOFRAN -ODT) 4 MG disintegrating tablet Take 1 tablet (4 mg total) by mouth every 8 (eight) hours as needed. 04/10/23   Claudene Rover, MD  prazosin  (MINIPRESS ) 1 MG capsule Take 1 capsule (1 mg total) by mouth at bedtime. 01/14/24   Simmons-Robinson, Rockie, MD  tacrolimus  (PROTOPIC ) 0.1 % ointment Apply topically 2 (two) times daily. As needed Patient taking differently: Apply topically as needed. As needed 11/19/23   Claudene Lehmann, MD  Vitamin D , Ergocalciferol , (DRISDOL ) 1.25 MG (50000 UNIT) CAPS capsule Take 1 capsule (50,000 Units total) by mouth every 7 (seven) days. Patient not taking: Reported on 01/20/2024 11/13/23   Starla Harland BROCKS, MD    Family History Family History  Problem Relation Age of Onset   Stroke Mother    Breast cancer Mother        dx 65s, no genetic testing   Diabetes Father    Throat cancer Paternal Uncle    Breast cancer Maternal Grandmother        dx under 50   Melanoma Maternal Grandfather        metastatic, d. 57s   Breast cancer Paternal Grandmother        dx 37s   Lung cancer Paternal Grandfather     Social History Social History   Tobacco Use   Smoking status: Never   Smokeless tobacco: Never  Vaping Use   Vaping status: Never Used  Substance Use Topics   Alcohol use: Never   Drug use: Never      Allergies   Aspirin, Codeine, Gold sodium thiomalate, Almond oil, and Coconut (cocos nucifera)   Review of Systems Review of Systems  Constitutional:  Negative for chills, diaphoresis, fatigue and fever.  HENT:  Negative for congestion and rhinorrhea.   Respiratory:  Positive for shortness of breath. Negative for cough and wheezing.   Cardiovascular:  Positive for chest pain. Negative for palpitations.  Gastrointestinal:  Negative for abdominal pain, nausea and vomiting.  Genitourinary:  Negative for difficulty urinating, dysuria, flank pain and frequency.  Musculoskeletal:  Positive for arthralgias, back pain and myalgias. Negative for neck pain.  Skin:  Negative for rash.  Neurological:  Negative for weakness and headaches.  Hematological:  Negative for adenopathy.     Physical Exam Triage Vital Signs ED Triage Vitals  Encounter Vitals Group     BP 02/15/24 1202 120/78     Girls Systolic BP Percentile --      Girls Diastolic BP Percentile --      Boys Systolic BP Percentile --      Boys Diastolic BP Percentile --      Pulse Rate 02/15/24 1202 84     Resp 02/15/24 1202 18     Temp 02/15/24 1202 98.4 F (36.9 C)     Temp Source 02/15/24 1202 Oral     SpO2 02/15/24 1202 97 %     Weight 02/15/24 1200 195 lb 1.7 oz (88.5 kg)     Height 02/15/24 1200 5' 5 (1.651 m)     Head Circumference --      Peak Flow --      Pain Score 02/15/24 1159 8     Pain Loc --      Pain Education --      Exclude from Growth Chart --    No data found.  Updated Vital Signs BP 120/78 (BP Location: Right Arm)   Pulse 84   Temp 98.4 F (36.9 C) (Oral)   Resp 18   Ht 5' 5 (1.651 m)   Wt 195 lb 1.7 oz (88.5 kg)   LMP 08/12/2023 (Approximate)   SpO2 97%   BMI 32.47 kg/m    Physical Exam Vitals and nursing note reviewed.  Constitutional:      General: She is not in acute distress.    Appearance: Normal appearance. She is not ill-appearing or toxic-appearing.  HENT:     Head:  Normocephalic and atraumatic.     Nose: Nose normal.     Mouth/Throat:     Mouth: Mucous membranes are moist.     Pharynx: Oropharynx is clear.  Eyes:     General: No scleral icterus.       Right eye: No discharge.        Left eye: No discharge.     Conjunctiva/sclera: Conjunctivae normal.  Cardiovascular:     Rate and Rhythm: Normal rate and regular rhythm.     Heart sounds: Normal heart sounds.  Pulmonary:     Effort: Pulmonary effort is normal. No respiratory distress.     Breath sounds: Normal breath sounds.  Chest:     Chest wall: Tenderness (TTP left upper chest wall) present.  Musculoskeletal:     Left shoulder: Tenderness (diffusely throughout posterior shoulder) present. No swelling or deformity. Decreased range of motion.     Left upper arm: Tenderness (lateral deltoid) present. No bony tenderness.     Cervical back: Neck supple.  Skin:    General: Skin is dry.  Neurological:     General: No focal deficit present.     Mental Status: She is alert. Mental status is at baseline.     Motor: No weakness.     Gait: Gait normal.  Psychiatric:        Mood and Affect: Mood normal.        Behavior: Behavior normal.      UC Treatments / Results  Labs (all labs ordered are listed, but only abnormal results  are displayed) Labs Reviewed - No data to display  EKG   Radiology DG Chest 2 View Result Date: 02/15/2024 CLINICAL DATA:  Chest pain and short of breath for 1 week EXAM: CHEST - 2 VIEW COMPARISON:  09/10/2022 FINDINGS: Frontal and lateral views of the chest demonstrate an unremarkable cardiac silhouette. No airspace disease, effusion, or pneumothorax. No acute bony abnormalities. IMPRESSION: 1. No acute intrathoracic process. Electronically Signed   By: Ozell Daring M.D.   On: 02/15/2024 13:13    Procedures ED EKG  Date/Time: 02/15/2024 1:47 PM  Performed by: Arvis Jolan NOVAK, PA-C Authorized by: Arvis Jolan NOVAK, PA-C   Previous ECG:    Previous ECG:   Compared to current   Similarity:  No change Interpretation:    Interpretation: normal   Rate:    ECG rate:  79   ECG rate assessment: normal   Rhythm:    Rhythm: sinus rhythm   Ectopy:    Ectopy: none   QRS:    QRS axis:  Normal   QRS intervals:  Normal   QRS conduction: normal   ST segments:    ST segments:  Normal T waves:    T waves: normal   Comments:     Normal sinus rhythm. Regular rate.  (including critical care time)  Medications Ordered in UC Medications  ketorolac  (TORADOL ) injection 30 mg (30 mg Intramuscular Given 02/15/24 1312)    Initial Impression / Assessment and Plan / UC Course  I have reviewed the triage vital signs and the nursing notes.  Pertinent labs & imaging results that were available during my care of the patient were reviewed by me and considered in my medical decision making (see chart for details).   38 year old female with history of anxiety, PTSD, asthma, prediabetes, bipolar disorder presents for left posterior shoulder, left upper arm and left upper chest pain which is worse with movement, laying on the left side, standing up from a seated position.  Also associated shortness of breath.  Taking ibuprofen  without relief.  No history of cardiac conditions and asthma is well-controlled.  No history of PE or DVT.  Vitals are all stable and normal and the patient is overall well-appearing.  No acute distress.  On exam she has tenderness throughout the left chest, left upper arm and left posterior shoulder.  Slightly reduced range of motion at the shoulder due to discomfort.  Chest is clear.  Heart regular rate and rhythm.  EKG performed by nursing staff is within normal limits.  Chest x-ray was performed and normal.  Reviewed all results with patient.  Suspect her symptoms are due to musculoskeletal condition.  Supportive care is advised.  She was given ketorolac  30 mg IM injection in clinic and prescribed naproxen  twice daily as needed and  tizanidine  at bedtime.  Reviewed going to emergency department if chest pain or breathing worsen or there is associated new symptoms such as presyncope/syncope, palpitations, dizziness, swelling.  Patient agreeable to plan.   Final Clinical Impressions(s) / UC Diagnoses   Final diagnoses:  Chest pain, unspecified type  Acute pain of left shoulder  Shortness of breath     Discharge Instructions      -EKG and chest xray look good -Your symptoms are due to musculoskeletal  pain -We gave you  ketorolac  in clinic and I prescribed Naproxen  and tizanidine  -If worsening pain please go to ER     ED Prescriptions     Medication Sig Dispense Auth. Provider  naproxen  (NAPROSYN ) 500 MG tablet Take 1 tablet (500 mg total) by mouth 2 (two) times daily. 30 tablet Arvis Huxley B, PA-C   tiZANidine  (ZANAFLEX ) 4 MG tablet Take 1 tablet (4 mg total) by mouth at bedtime as needed for up to 10 days for muscle spasms. 10 tablet Keldan Eplin B, PA-C      PDMP not reviewed this encounter.   Arvis Huxley NOVAK, PA-C 02/15/24 1351

## 2024-02-15 NOTE — ED Triage Notes (Addendum)
 Pt c/o chest pain, upper back pain, and left shoulder pain. Started about a week ago. She states it is a burning pain. She states it feels like someone punches her in the chest. She states the pain is worse when she moves her left arm/shoulder. She also has shortness of breath. Denies n/v/d. No known injury.

## 2024-02-19 ENCOUNTER — Ambulatory Visit (INDEPENDENT_AMBULATORY_CARE_PROVIDER_SITE_OTHER)

## 2024-02-19 DIAGNOSIS — L209 Atopic dermatitis, unspecified: Secondary | ICD-10-CM | POA: Diagnosis not present

## 2024-02-19 DIAGNOSIS — L2084 Intrinsic (allergic) eczema: Secondary | ICD-10-CM

## 2024-02-19 DIAGNOSIS — Z79899 Other long term (current) drug therapy: Secondary | ICD-10-CM | POA: Diagnosis not present

## 2024-02-19 DIAGNOSIS — L301 Dyshidrosis [pompholyx]: Secondary | ICD-10-CM

## 2024-02-19 MED ORDER — CLOBETASOL PROPIONATE 0.05 % EX CREA
TOPICAL_CREAM | CUTANEOUS | 5 refills | Status: AC
Start: 2024-02-19 — End: ?

## 2024-02-19 MED ORDER — TACROLIMUS 0.1 % EX OINT: TOPICAL_OINTMENT | Freq: Two times a day (BID) | CUTANEOUS | 2 refills | Status: DC

## 2024-02-19 NOTE — Patient Instructions (Addendum)
 Gentle Skin Care Guide  1. Bathe no more than once a day.  2. Avoid bathing in hot water  3. Use a mild soap like Dove, Vanicream, Cetaphil, CeraVe. Can use Lever 2000 or Cetaphil antibacterial soap  4. Use soap only where you need it. On most days, use it under your arms, between your legs, and on your feet. Let the water rinse other areas unless visibly dirty.  5. When you get out of the bath/shower, use a towel to gently blot your skin dry, don't rub it.  6. While your skin is still a little damp, apply a moisturizing cream such as Vanicream, CeraVe, Cetaphil, Eucerin, Sarna lotion or plain Vaseline Jelly. For hands apply Neutrogena [place] Hand Cream or Excipial Hand Cream.  7. Reapply moisturizer any time you start to itch or feel dry.  8. Sometimes using free and clear laundry detergents can be helpful. Fabric softener sheets should be avoided. Downy Free & Gentle liquid, or any liquid fabric softener that is free of dyes and perfumes, it acceptable to use  9. If your doctor has given you prescription creams you may apply moisturizers over them      Topical steroids (such as triamcinolone , fluocinolone, fluocinonide , mometasone, clobetasol , halobetasol, betamethasone, hydrocortisone) can cause thinning and lightening of the skin if they are used for too long in the same area. Your physician has selected the right strength medicine for your problem and area affected on the body. Please use your medication only as directed by your physician to prevent side effects.    Due to recent changes in healthcare laws, you may see results of your pathology and/or laboratory studies on MyChart before the doctors have had a chance to review them. We understand that in some cases there may be results that are confusing or concerning to you. Please understand that not all results are received at the same time and often the doctors may need to interpret multiple results in order to provide you with  the best plan of care or course of treatment. Therefore, we ask that you please give us  2 business days to thoroughly review all your results before contacting the office for clarification. Should we see a critical lab result, you will be contacted sooner.   If You Need Anything After Your Visit  If you have any questions or concerns for your doctor, please call our main line at [phone] and press option 4 to reach your doctor's medical assistant. If no one answers, please leave a voicemail as directed and we will return your call as soon as possible. Messages left after 4 pm will be answered the following business day.   You may also send us  a message via MyChart. We typically respond to MyChart messages within 1-2 business days.  For prescription refills, please ask your pharmacy to contact our office. Our fax number is [phone].  If you have an urgent issue when the clinic is closed that cannot wait until the next business day, you can page your doctor at the number below.    Please note that while we do our best to be available for urgent issues outside of office hours, we are not available 24/7.   If you have an urgent issue and are unable to reach us , you may choose to seek medical care at your doctor's office, retail clinic, urgent care center, or emergency room.  If you have a medical emergency, please immediately call 911 or go to the emergency department.  Pager Numbers  - Dr. Hester: [phone]  - Dr. Jackquline: [phone]  - Dr. Claudene: [phone]   - Dr. Raymund: [phone]  In the event of inclement weather, please call our main line at [phone] for an update on the status of any delays or closures.  Dermatology Medication Tips: Please keep the boxes that topical medications come in in order to help keep track of the instructions about where and how to use these. Pharmacies typically print the medication instructions only on the boxes and not directly on  the medication tubes.   If your medication is too expensive, please contact our office at [phone] option 4 or send us  a message through MyChart.   We are unable to tell what your co-pay for medications will be in advance as this is different depending on your insurance coverage. However, we may be able to find a substitute medication at lower cost or fill out paperwork to get insurance to cover a needed medication.   If a prior authorization is required to get your medication covered by your insurance company, please allow us  1-2 business days to complete this process.  Drug prices often vary depending on where the prescription is filled and some pharmacies may offer cheaper prices.  The website www.goodrx.com contains coupons for medications through different pharmacies. The prices here do not account for what the cost may be with help from insurance (it may be cheaper with your insurance), but the website can give you the price if you did not use any insurance.  - You can print the associated coupon and take it with your prescription to the pharmacy.  - You may also stop by our office during regular business hours and pick up a GoodRx coupon card.  - If you need your prescription sent electronically to a different pharmacy, notify our office through [facility] or by phone at [phone] option 4.     Si Usted Necesita Algo Despus de Su Visita  Tambin puede enviarnos un mensaje a travs de Clinical cytogeneticist. Por lo general respondemos a los mensajes de MyChart en el transcurso de 1 a 2 das hbiles.  Para renovar recetas, por favor pida a su farmacia que se ponga en contacto con nuestra oficina. Randi lakes de fax es [place] [phone].  Si tiene un asunto urgente cuando la clnica est cerrada y que no puede esperar hasta el siguiente da hbil, puede llamar/localizar a su doctor(a) al nmero que aparece a continuacin.   Por favor, tenga en cuenta que aunque hacemos todo lo  posible para estar disponibles para asuntos urgentes fuera del horario de [place], no estamos disponibles las 24 horas del da, los 7 [address]  [address] de la [place].   Si tiene un problema urgente y no puede comunicarse con nosotros, puede optar por buscar atencin mdica  en el consultorio de su doctor(a), en una clnica privada, en un centro de atencin urgente o en una sala de emergencias.  Si tiene Engineer, drilling, por favor llame inmediatamente al 911 o vaya a la sala de emergencias.  Nmeros de bper  - Dr. Hester: [phone]  - Dra. Jackquline: [phone]  - Dr. Claudene: [phone]  - Dra. Kitts: [phone]  En caso de inclemencias del [place], por favor llame a nuestra lnea principal al [phone] para una actualizacin sobre el estado de cualquier retraso o cierre.  Consejos para la medicacin en dermatologa: Por favor, guarde las cajas en las que vienen los medicamentos de uso tpico para ayudarle a seguir  las instrucciones sobre dnde y cmo usarlos. Las farmacias generalmente imprimen las instrucciones del medicamento slo en las cajas y no directamente en los tubos del [place].   Si su medicamento es muy caro, por favor, pngase en contacto con landry rieger llamando al [phone] y presione la opcin 4 o envenos un mensaje a travs de Clinical cytogeneticist.   No podemos decirle cul ser su copago por los medicamentos por adelantado ya que esto es diferente dependiendo de la cobertura de su seguro. Sin embargo, es posible que podamos encontrar un medicamento sustituto a Audiological scientist un formulario para que el seguro cubra el medicamento que se considera necesario.   Si se requiere una autorizacin previa para que su compaa de seguros malta su medicamento, por favor permtanos de 1 a 2 das hbiles para completar este proceso.  Los precios de los medicamentos varan con frecuencia dependiendo del Environmental consultant de dnde se surte la receta y alguna farmacias pueden ofrecer precios  ms baratos.  El sitio web www.goodrx.com tiene cupones para medicamentos de Health and safety inspector. Los precios aqu no tienen en cuenta lo que podra costar con la ayuda del seguro (puede ser ms barato con su seguro), pero el sitio web puede darle el precio si no utiliz Tourist information centre manager.  - Puede imprimir el cupn correspondiente y llevarlo con su receta a la farmacia.  - Tambin puede pasar por nuestra oficina durante el horario de atencin regular y Education officer, museum una tarjeta de cupones de GoodRx.  - Si necesita que su receta se enve electrnicamente a una farmacia diferente, informe a nuestra oficina a travs de MyChart de [place] o por telfono llamando al [phone] y presione la opcin 4.

## 2024-02-19 NOTE — Progress Notes (Signed)
   Follow-Up Visit   Subjective  Deborah Lambert is a 38 y.o. female who presents for the following: Atopic dermatitis. Patient has improved greatly on Dupixent . Patient denied eye irritation.    The following portions of the chart were reviewed this encounter and updated as appropriate: medications, allergies, medical history  Review of Systems:  No other skin or systemic complaints except as noted in HPI or Assessment and Plan.  Objective  Well appearing patient in no apparent distress; mood and affect are within normal limits.   A focused examination was performed of the following areas: Hands  Clear exam   Relevant exam findings are noted in the Assessment and Plan.    Assessment & Plan   DYSHIDROTIC ECZEMA Previously failed tacrolimus  and clobetasol , now at treatment goal with dupixent     Chronic condition with duration or expected duration over one year. Currently well-controlled.  Atopic dermatitis - Severe, on Dupixent  (biologic medication).  Atopic dermatitis (eczema) is a chronic, relapsing, pruritic condition that can significantly affect quality of life. It is often associated with allergic rhinitis and/or asthma and can require treatment with topical medications, phototherapy, or in severe cases biologic medications, which require long term medication management.     Treatment Plan: Cont Dupixent  300mg /4ml sq injections q 2 wks Cont Clobetasol  cr qd/bid prn flares, avoid f/g/a Cont Tacrolimus  0.1% oint qd/bid   Recommend gentle skin care.    Dupilumab  (Dupixent ) is a treatment given by injection for adults and children with moderate-to-severe atopic dermatitis. Goal is control of skin condition, not cure. It is given as 2 injections at the first dose followed by 1 injection ever 2 weeks thereafter.  Young children are dosed monthly.   Potential side effects include allergic reaction, herpes infections, injection site reactions and conjunctivitis (inflammation  of the eyes).  The use of Dupixent  requires long term medication management, including periodic office visits.   Topical steroids (such as triamcinolone , fluocinolone, fluocinonide , mometasone, clobetasol , halobetasol, betamethasone, hydrocortisone) can cause thinning and lightening of the skin if they are used for too long in the same area. Your physician has selected the right strength medicine for your problem and area affected on the body. Please use your medication only as directed by your physician to prevent side effects.     Return in about 1 year (around 02/18/2025) for Atopic derm.  I, Emerick Ege, CMA am acting as scribe for Lauraine JAYSON Kanaris, MD.   Documentation: I have reviewed the above documentation for accuracy and completeness, and I agree with the above.  Lauraine JAYSON Kanaris, MD

## 2024-03-08 ENCOUNTER — Encounter

## 2024-03-23 DIAGNOSIS — R490 Dysphonia: Secondary | ICD-10-CM | POA: Diagnosis not present

## 2024-03-23 DIAGNOSIS — J301 Allergic rhinitis due to pollen: Secondary | ICD-10-CM | POA: Diagnosis not present

## 2024-03-23 DIAGNOSIS — R0683 Snoring: Secondary | ICD-10-CM | POA: Diagnosis not present

## 2024-03-23 DIAGNOSIS — K219 Gastro-esophageal reflux disease without esophagitis: Secondary | ICD-10-CM | POA: Diagnosis not present

## 2024-04-01 ENCOUNTER — Telehealth: Payer: Self-pay

## 2024-04-01 NOTE — Telephone Encounter (Signed)
 Patient called and left voicemail on nurse line stating she had developed some dry patches at hands after administering Dupixent  injection. Patient stated she does not know if related or not. Left message for patient to call back asking for more information.

## 2024-04-01 NOTE — Telephone Encounter (Signed)
 Patient took injection two weeks ago and noticed the areas on her hands 1-2 days later. She is going to send a MyChart photo so we can send to Dr. Raymund. Advised patient to hold injection until we discuss with her next week once Dr. Raymund responds. aw

## 2024-04-01 NOTE — Telephone Encounter (Signed)
 Patient also complained of bruising around injection site.  Patient did not describe injection site reaction but states it did become bruised. aw

## 2024-04-02 NOTE — Telephone Encounter (Signed)
 Patient declines any fever or chills but did have little eye irritation that has resolved on its own.  She states no miss fire but a lot more pain with this injection than normal. She does have clobetasol  cream at home that she will start BID.  Follow up scheduled for next Tuesday.

## 2024-04-06 ENCOUNTER — Ambulatory Visit

## 2024-04-07 ENCOUNTER — Ambulatory Visit

## 2024-04-07 DIAGNOSIS — H5789 Other specified disorders of eye and adnexa: Secondary | ICD-10-CM | POA: Diagnosis not present

## 2024-04-07 DIAGNOSIS — L308 Other specified dermatitis: Secondary | ICD-10-CM | POA: Diagnosis not present

## 2024-04-07 DIAGNOSIS — L233 Allergic contact dermatitis due to drugs in contact with skin: Secondary | ICD-10-CM | POA: Diagnosis not present

## 2024-04-07 DIAGNOSIS — L309 Dermatitis, unspecified: Secondary | ICD-10-CM

## 2024-04-07 NOTE — Patient Instructions (Signed)

## 2024-04-07 NOTE — Progress Notes (Signed)
 Subjective   Deborah Lambert is a 38 y.o. female who presents for the following: Follow up of Dupixent . Patient is established patient   Today patient reports: Issues with most recent Dupixent  injection. Patient reports bruising and swelling at the injection site on her right thigh. She also c/o skin peeling on bilateral hangs and itching and draining of left eye. Patient is unsure what injection # this was but thinks it may have been her 8th.   Rash around injection site has now resolved. Still some irritation of L eye. Mild peeling on hands.   Review of Systems:    No other skin or systemic complaints except as noted in HPI or Assessment and Plan.  The following portions of the chart were reviewed this encounter and updated as appropriate: medications, allergies, medical history  Relevant Medical History:  n/a   Objective  Well appearing patient in no apparent distress; mood and affect are within normal limits. Examination was performed of the: Sun Exposed Exam: Scalp, head, eyes, ears, nose, lips, neck, upper extremities, hands, fingers, fingernails  Examination notable for: hands with collarettes of scale, mild erythema Erythema of L conjunctiva  Examination limited by: Clothing and Patient deferred removal       Assessment & Plan   Hand dermatitis - mild/moderate  C/b potential injection site reaction, L eye irritation with dupixent   Chronic and persistent condition with duration or expected duration over one year. Condition is symptomatic and bothersome to patient. Patient is flaring and not currently at treatment goal.  - Initiated dupixent  01/2024 with good response. Noted to have injection site pain, eye irritation, hand peeling after most recent injection (#8). No anaphylaxis sx. No other rash elsewhere or mucosal sx. Discussed could be injection site reaction with keratitis of eye (known side effect of dupixent ) vs misfire now causing flare of hand dermatitis vs other.    Plan - Start delgocitinib cream 2% (ANZUPGO) BID - Approved for hand dermatitis > 1 yo  - Discussed side effects including pain, paresthesia, pruritus, erythema, increased risk of skin infection (reported in <1% of patients)  - Discussed risk of herpes virus reactivation, increased risk of non melanoma skin cancers - Avoid live vaccines during treatment  - Discussed risks of MACE events, thrombosis, lipid abnormalities with oral JAKs - low risk with topicals   - Continue clobetasol  ointment BID prn  - Scheduled to see eye doctor this week - Discussed if not controlled on above could consider: alternate biologic vs rinvoq vs other  - Anzupgo samples given in clinic       NDC: 49777-719-08       Lot: [id number]       Exp: 12/2026  Injection site reaction of the right thigh L eye irritation  - Bruising and swelling of the right thigh post Dupixent  injection - Discussed continuing Dupixent  vs switching to topical treatment vs alternate biologic as per above  - Patient has appointment with her eye doctor on Thursday for itching and drainage of her left eye since injection   Level of service outlined above   Procedures, orders, diagnosis for this visit:  HAND DERMATITIS   EYE IRRITATION    Hand dermatitis  Eye irritation    Return to clinic: Return in about 4 months (around 08/08/2024) for Hand dermatitis.  Documentation:  I, Emerick Ege, CMA am acting as scribe for Lauraine JAYSON Kanaris, MD.  I have reviewed the above documentation for accuracy and completeness, and I agree with the  above.  Lauraine JAYSON Kanaris, MD

## 2024-04-09 ENCOUNTER — Ambulatory Visit: Payer: Self-pay | Admitting: *Deleted

## 2024-04-09 NOTE — Telephone Encounter (Signed)
 FYI Only or Action Required?: FYI only for provider.  Patient was last seen in primary care on 01/14/2024 by Sharma Coyer, MD.  Called Nurse Triage reporting Pain.  Symptoms began several days ago.  Interventions attempted: OTC medications: ibuprofen  .  Symptoms are: gradually worsening.  Triage Disposition: See Physician Within 24 Hours  Patient/caregiver understands and will follow disposition?: Yes                Copied from CRM #[id number]. Topic: Clinical - Red Word Triage >> Apr 09, 2024  9:42 AM Deborah Lambert wrote: Red Word that prompted transfer to Nurse Triage: Pain in head , hurting when trying move hair and ridge on top of head. Stated she had a concussion last year & believes it's from that Reason for Disposition  [1] MODERATE headache (e.g., interferes with normal activities) AND [2] present > 24 hours AND [3] unexplained  (Exceptions: Pain medicines not tried, typical migraine, or headache part of viral illness.)  Answer Assessment - Initial Assessment Questions Offered appt today with other provider and patient declined with J. Ostwalt, PA. Patient schedule for tomorrow with other provider. Recommended if sx worsen change in vision , go to ED. Patient wants MRI. Hx concussion and only had CT scan last year.      1. LOCATION: Where does it hurt?      Top of head  2. ONSET: When did the headache start? (e.g., minutes, hours, days)      2 days ago  3. PATTERN: Does the pain come and go, or has it been constant since it started?     Comes and goes  4. SEVERITY: How bad is the pain? and What does it keep you from doing?  (e.g., Scale 1-10; mild, moderate, or severe)     6 1/2 /10 5. RECURRENT SYMPTOM: Have you ever had headaches before? If Yes, ask: When was the last time? and What happened that time?      No  6. CAUSE: What do you think is causing the headache?     Concussion last year 7. MIGRAINE: Have you been diagnosed with  migraine headaches? If Yes, ask: Is this headache similar?      na 8. HEAD INJURY: Has there been any recent injury to your head?      Last year concussion 9. OTHER SYMPTOMS: Do you have any other symptoms? (e.g., fever, stiff neck, eye pain, sore throat, cold symptoms)     Pain in scalp when moving hair top of head , taking ibuprofen  with some relief. Vision seeing spots at times comes and goes.  10. PREGNANCY: Is there any chance you are pregnant? When was your last menstrual period?       na  Protocols used: Headache-A-AH

## 2024-04-10 ENCOUNTER — Ambulatory Visit (INDEPENDENT_AMBULATORY_CARE_PROVIDER_SITE_OTHER)

## 2024-04-10 VITALS — BP 108/82 | HR 78 | Temp 98.2°F | Ht 65.0 in | Wt 200.0 lb

## 2024-04-10 DIAGNOSIS — G43109 Migraine with aura, not intractable, without status migrainosus: Secondary | ICD-10-CM | POA: Diagnosis not present

## 2024-04-10 MED ORDER — SUMATRIPTAN SUCCINATE 50 MG PO TABS: 50.0000 mg | ORAL_TABLET | ORAL | 0 refills | Status: AC | PRN

## 2024-04-10 NOTE — Telephone Encounter (Signed)
 Patient call note and symptoms reviewed. Agree with scheduled appt.

## 2024-04-10 NOTE — Progress Notes (Signed)
 Acute visit   Patient: Deborah Lambert   DOB: [birth year]   38 y.o. Female  MRN: [id number] PCP: Sharma Coyer, MD   Chief Complaint  Patient presents with   Headache    Patient reports headache on top/ center of head, seeing spots off and on - RN triage  Patient reports concussion one year ago and states that since then she has had pain in the center/ top of her head where she was hit. Reports brushing or touching her hair is painful and states even if nothing is touching it then it will hurt. Says it will sometimes feel itchy/ burn. She can still feel the knot      Subjective    Discussed the use of AI scribe software for clinical note transcription with the patient, who gave verbal consent to proceed.  History of Present Illness Deborah Lambert is a 38 year old female with post-concussion syndrome who presents with worsening headaches and scalp pain.  She experienced a concussion on October 4th of the previous year while assembling a bed, resulting in a head injury from a 150-pound headboard. Initially, her symptoms of post-concussion syndrome had mellowed but have recently worsened.  She describes a painful ridge on the top of her head, especially when her hair is moved, and experiences headaches that are sometimes alleviated by ibuprofen , Tylenol , or sleep. She feels irritable and prefers to be alone during these episodes.  She has experienced spots in her vision since the concussion, which come and go. An eye doctor recently diagnosed her with astigmatism, and she is awaiting new glasses to address this issue.  She has a history of severe cluster headaches that previously required hospitalization due to severe symptoms, including lack of eating and sleeping. She has not been treated with sumatriptan or similar medications for migraines.  No recent head injuries or history of high blood pressure. She occasionally feels queasy but not persistently nauseous. No  fevers or chills, though she has felt cold recently.  Review of systems as noted in HPI.   Objective    BP 108/82 (BP Location: Right Arm, Patient Position: Sitting, Cuff Size: Normal)   Pulse 78   Temp 98.2 F (36.8 C) (Oral)   Ht 5' 5 (1.651 m)   Wt 200 lb (90.7 kg)   LMP 08/12/2023 (Approximate)   SpO2 98%   BMI 33.28 kg/m  Physical Exam Constitutional:      Appearance: Normal appearance.  HENT:     Head: Normocephalic and atraumatic. No abrasion, contusion, masses or laceration. Hair is normal.     Mouth/Throat:     Mouth: Mucous membranes are moist.  Eyes:     Extraocular Movements: Extraocular movements intact.     Pupils: Pupils are equal, round, and reactive to light.  Pulmonary:     Effort: Pulmonary effort is normal.  Skin:    General: Skin is warm.  Neurological:     General: No focal deficit present.     Mental Status: She is alert.       No results found for any visits on 04/10/24.  Assessment & Plan     Problem List Items Addressed This Visit       Cardiovascular and Mediastinum   Migraine with aura and without status migrainosus, not intractable - Primary   Relevant Medications   SUMAtriptan (IMITREX) 50 MG tablet    Assessment and Plan Assessment & Plan Chronic post-concussion syndrome with migraine Chronic post-concussion syndrome with  exacerbated symptoms, including headaches and visual disturbances. Seen by neurology in May 2025, MRI done at that time showed no new findings. Headache thought to be due to occipital neuralgia and started on gabapentin  but this did not help. Recommended that patient get occipital nerve block but never done. DDx for patient's current symptoms include migraines vs. occipital neuralgia vs tension headaches. - Prescribed sumatriptan at headache onset. Second dose if no relief after two hours. Max two doses per day. - If no improvement, recommend referral to neurology for occipital nerve block.  - Follow up with  PCP   Meds ordered this encounter  Medications   SUMAtriptan (IMITREX) 50 MG tablet    Sig: Take 1 tablet (50 mg total) by mouth every 2 (two) hours as needed for migraine. May repeat in 2 hours if headache persists or recurs.    Dispense:  12 tablet    Refill:  0     No follow-ups on file.      Isaiah DELENA Pepper, MD  [facility] [phone] (phone) [phone] (fax)

## 2024-04-14 ENCOUNTER — Ambulatory Visit (INDEPENDENT_AMBULATORY_CARE_PROVIDER_SITE_OTHER): Admitting: Family Medicine

## 2024-04-14 VITALS — BP 111/78 | HR 80 | Temp 98.6°F | Ht 65.0 in | Wt 200.0 lb

## 2024-04-14 DIAGNOSIS — Z01419 Encounter for gynecological examination (general) (routine) without abnormal findings: Secondary | ICD-10-CM

## 2024-04-14 DIAGNOSIS — E781 Pure hyperglyceridemia: Secondary | ICD-10-CM | POA: Diagnosis not present

## 2024-04-14 DIAGNOSIS — Z131 Encounter for screening for diabetes mellitus: Secondary | ICD-10-CM

## 2024-04-14 DIAGNOSIS — R1031 Right lower quadrant pain: Secondary | ICD-10-CM | POA: Diagnosis not present

## 2024-04-14 DIAGNOSIS — E66811 Obesity, class 1: Secondary | ICD-10-CM | POA: Diagnosis not present

## 2024-04-14 DIAGNOSIS — Z6833 Body mass index (BMI) 33.0-33.9, adult: Secondary | ICD-10-CM

## 2024-04-14 DIAGNOSIS — Z0001 Encounter for general adult medical examination with abnormal findings: Secondary | ICD-10-CM | POA: Diagnosis not present

## 2024-04-14 DIAGNOSIS — Z0000 Encounter for general adult medical examination without abnormal findings: Secondary | ICD-10-CM

## 2024-04-14 NOTE — Progress Notes (Signed)
 Complete physical exam   Patient: Deborah Lambert   DOB: [birth year]   38 y.o. Female  MRN: [id number] Visit Date: 04/14/2024  Today's healthcare provider: Rockie Agent, MD   Chief Complaint  Patient presents with   Annual Exam    Diet is normal according to patient, trying to lose weight. Exercises by walking during the week.  Vaccines: Declines all today Screenings: UTD    Subjective    Deborah Lambert is a 38 y.o. female who presents today for a complete physical exam.    She does not have additional problems to discuss today.   Discussed the use of AI scribe software for clinical note transcription with the patient, who gave verbal consent to proceed.  History of Present Illness Deborah Lambert is a 38 year old female who presents for an annual physical exam.  She has been experiencing difficulty losing weight following a hysterectomy. Despite efforts to lose weight, including walking with her [place] shepherd and modifying her diet to include more fruits and vegetables while reducing sweets, she has been unable to return to her pre-surgery weight of 160 pounds and remains at 200 pounds. She walks for two hours with her dog, estimating around 25,000 steps per day. She has cut back on her food intake, eating smaller portions of meals, and primarily drinks water, with one cup of coffee daily. Despite these efforts, her weight remains unchanged.  She has a history of mixed bipolar disorder, depression, CIN3 with severe dysplasia, and is at high risk for breast cancer. She declined several vaccines, including influenza, COVID, pneumococcal, hepatitis B, and HPV vaccines.  No previous episodes of similar pain.     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   Cluster headaches    Concussion 2024   due to trauma   Depression 2004   Brother murder   Dyspnea    Focal seizures (HCC)    GERD (gastroesophageal reflux disease)    Heart murmur    Pneumonia    Pre-diabetes    PTSD (post-traumatic stress disorder)    Seizures (HCC)    Past Surgical History:  Procedure Laterality Date   ABDOMINAL HYSTERECTOMY  08-2023   FRACTURE SURGERY     LAPAROSCOPIC VAGINAL HYSTERECTOMY WITH SALPINGECTOMY N/A 09/02/2023   Procedure: LAPAROSCOPIC ASSISTED VAGINAL HYSTERECTOMY WITH BILATERAL SALPINGECTOMY;  Surgeon: Connell Davies, MD;  Location: [facility] ORS;  Service: Gynecology;  Laterality: N/A;   right arm surgery     TUBAL LIGATION     Social History   Socioeconomic History   Marital status: Married    Spouse name: Casimir,JAMES (Spouse)   Number of children: Not on file   Years of education: Not on file   Highest education level: 11th grade  Occupational History   Not on file  Tobacco Use   Smoking status: Never   Smokeless tobacco: Never  Vaping Use   Vaping status: Never Used  Substance and Sexual Activity   Alcohol use: Never   Drug use: Never   Sexual activity: Not Currently    Birth control/protection: Surgical  Other Topics Concern   Not on file  Social History Narrative   ** Merged History Encounter **  Pt have 2 kids.   Social Drivers of Corporate investment banker Strain: Low Risk  (04/14/2024)   Overall Financial Resource Strain (CARDIA)    Difficulty of Paying Living Expenses: Not very hard  Food Insecurity: Food Insecurity Present (04/14/2024)   Hunger Vital Sign    Worried About Running Out of Food in the Last Year: Sometimes true    Ran Out of Food in the Last Year: Sometimes true  Transportation Needs: No Transportation Needs (04/14/2024)   PRAPARE - Administrator, Civil Service (Medical): No    Lack of Transportation (Non-Medical): No  Physical Activity: Sufficiently Active  (04/14/2024)   Exercise Vital Sign    Days of Exercise per Week: 7 days    Minutes of Exercise per Session: 40 min  Stress: Stress Concern Present (04/14/2024)   [employer] of Occupational Health - Occupational Stress Questionnaire    Feeling of Stress: To some extent  Social Connections: Moderately Isolated (04/14/2024)   Social Connection and Isolation Panel    Frequency of Communication with Friends and Family: Twice a week    Frequency of Social Gatherings with Friends and Family: Once a week    Attends Religious Services: Never    Database administrator or Organizations: No    Attends Engineer, structural: Not on file    Marital Status: Married  Catering manager Violence: Not At Risk (10/07/2023)   Humiliation, Afraid, Rape, and Kick questionnaire    Fear of Current or Ex-Partner: No    Emotionally Abused: No    Physically Abused: No    Sexually Abused: No   Family Status  Relation Name Status   Mother Patty Alive   Father Lynwood Alive   Brother Fonda Deceased   Brother  Alive   Mat Aunt x2 Alive   Pat Aunt  Alive   Pat Uncle  Alive   MGM  Alive   MGF  Deceased   PGM  Alive   PGF  Deceased  No partnership data on file   Family History  Problem Relation Age of Onset   Stroke Mother    Breast cancer Mother        dx 30s, no genetic testing   Diabetes Father    Early death Brother    Throat cancer Paternal Uncle    Breast cancer Maternal Grandmother        dx under 50   Melanoma Maternal Grandfather        metastatic, d. 36s   Breast cancer Paternal Grandmother        dx 38s   Lung cancer Paternal Grandfather    Allergies  Allergen Reactions   Aspirin Shortness Of Breath and Rash   Codeine Shortness Of Breath and Rash   Gold Sodium Thiomalate    Almond Oil Rash   Coconut (Cocos Nucifera) Rash     Medications: Outpatient Medications Prior to Visit  Medication Sig   albuterol  (VENTOLIN  HFA) 108 (90 Base) MCG/ACT inhaler Inhale 2 puffs  into the lungs every 6 (six) hours as needed for wheezing or shortness of breath. Inhale into the lungs every 6 (six) hours as needed for wheezing or shortness of breath.   clobetasol  cream (TEMOVATE ) 0.05 % Apply to aa's hands BID PRN. Avoid applying to face, groin, and axilla. Use as directed. Long-term use can cause thinning of the skin.   ELIDEL  1 % cream Apply to aa's hands BID PRN.   ibuprofen  (ADVIL ) 600  MG tablet Take 1 tablet (600 mg total) by mouth every 6 (six) hours as needed.   naproxen  (NAPROSYN ) 500 MG tablet Take 1 tablet (500 mg total) by mouth 2 (two) times daily.   ondansetron  (ZOFRAN -ODT) 4 MG disintegrating tablet Take 1 tablet (4 mg total) by mouth every 8 (eight) hours as needed.   prazosin  (MINIPRESS ) 1 MG capsule Take 1 capsule (1 mg total) by mouth at bedtime.   SUMAtriptan (IMITREX) 50 MG tablet Take 1 tablet (50 mg total) by mouth every 2 (two) hours as needed for migraine. May repeat in 2 hours if headache persists or recurs.   tacrolimus  (PROTOPIC ) 0.1 % ointment Apply topically 2 (two) times daily. As needed   Dupilumab  (DUPIXENT ) 300 MG/2ML SOAJ Inject 300 mg into the skin every 14 (fourteen) days. Starting at day 15 for maintenance. (Patient not taking: Reported on 04/14/2024)   gabapentin  (NEURONTIN ) 100 MG capsule Take 200 mg by mouth 2 (two) times daily. (Patient not taking: Reported on 04/14/2024)   meloxicam  (MOBIC ) 7.5 MG tablet Take 1-2 tablets (7.5-15 mg total) by mouth daily. (Patient not taking: Reported on 04/14/2024)   No facility-administered medications prior to visit.    Review of Systems  Last CBC Lab Results  Component Value Date   WBC 7.1 04/15/2024   HGB 14.2 04/15/2024   HCT 43.3 04/15/2024   MCV 90 04/15/2024   MCH 29.6 04/15/2024   RDW 12.5 04/15/2024   PLT 215 04/15/2024   Last metabolic panel Lab Results  Component Value Date   GLUCOSE 91 04/15/2024   NA 139 04/15/2024   K 4.2 04/15/2024   CL 104 04/15/2024   CO2 21  04/15/2024   BUN 9 04/15/2024   CREATININE 0.92 04/15/2024   GFRNONAA >60 09/02/2023   CALCIUM 9.3 04/15/2024   PROT 6.9 04/15/2024   ALBUMIN 4.0 04/15/2024   LABGLOB 2.9 04/15/2024   AGRATIO 1.6 10/31/2022   BILITOT 0.5 04/15/2024   ALKPHOS 74 04/15/2024   AST 16 04/15/2024   ALT 18 04/15/2024   ANIONGAP 8 09/02/2023   Last lipids Lab Results  Component Value Date   CHOL 265 (H) 04/15/2024   HDL 36 (L) 04/15/2024   LDLCALC 192 (H) 04/15/2024   TRIG 195 (H) 04/15/2024   CHOLHDL 7.4 (H) 04/15/2024   The ASCVD Risk score (Arnett DK, et al., 2019) failed to calculate for the following reasons:   The 2019 ASCVD risk score is only valid for ages 31 to 69  Last hemoglobin A1c Lab Results  Component Value Date   HGBA1C 5.5 04/15/2024   Last thyroid  functions Lab Results  Component Value Date   TSH 1.590 01/20/2024   THYROIDAB 20 01/20/2024   Last vitamin D  Lab Results  Component Value Date   VD25OH 38.1 01/20/2024   Last vitamin B12 and Folate Lab Results  Component Value Date   VITAMINB12 532 01/20/2024       Objective    BP 111/78 (BP Location: Left Arm, Patient Position: Sitting, Cuff Size: Normal)   Pulse 80   Temp 98.6 F (37 C) (Oral)   Ht 5' 5 (1.651 m)   Wt 200 lb (90.7 kg)   LMP 08/12/2023 (Approximate)   SpO2 98%   BMI 33.28 kg/m  BP Readings from Last 3 Encounters:  04/14/24 111/78  04/10/24 108/82  02/15/24 120/78   Wt Readings from Last 3 Encounters:  04/14/24 200 lb (90.7 kg)  04/10/24 200 lb (90.7 kg)  02/15/24 195 lb  1.7 oz (88.5 kg)        Physical Exam Vitals reviewed.  Constitutional:      General: She is not in acute distress.    Appearance: Normal appearance. She is not ill-appearing, toxic-appearing or diaphoretic.  HENT:     Head: Normocephalic and atraumatic.     Right Ear: Tympanic membrane and external ear normal. There is no impacted cerumen.     Left Ear: Tympanic membrane and external ear normal. There is no  impacted cerumen.     Nose: Nose normal.     Mouth/Throat:     Pharynx: Oropharynx is clear.  Eyes:     General: No scleral icterus.    Extraocular Movements: Extraocular movements intact.     Conjunctiva/sclera: Conjunctivae normal.     Pupils: Pupils are equal, round, and reactive to light.  Cardiovascular:     Rate and Rhythm: Normal rate and regular rhythm.     Pulses: Normal pulses.     Heart sounds: Normal heart sounds. No murmur heard.    No friction rub. No gallop.  Pulmonary:     Effort: Pulmonary effort is normal. No respiratory distress.     Breath sounds: Normal breath sounds. No wheezing, rhonchi or rales.  Abdominal:     General: Bowel sounds are normal. There is no distension.     Palpations: Abdomen is soft. There is no mass.     Tenderness: There is abdominal tenderness in the right upper quadrant and right lower quadrant. There is no guarding.  Musculoskeletal:        General: No deformity.     Cervical back: Normal range of motion and neck supple.     Right lower leg: No edema.     Left lower leg: No edema.  Lymphadenopathy:     Cervical: No cervical adenopathy.  Skin:    General: Skin is warm.     Capillary Refill: Capillary refill takes less than 2 seconds.     Findings: No erythema or rash.  Neurological:     General: No focal deficit present.     Mental Status: She is alert and oriented to person, place, and time.     Cranial Nerves: Cranial nerves 2-12 are intact. No cranial nerve deficit or facial asymmetry.     Motor: Motor function is intact. No weakness.     Gait: Gait normal.  Psychiatric:        Mood and Affect: Mood normal.        Behavior: Behavior normal.      Last depression screening scores    04/14/2024    1:45 PM 04/10/2024   10:28 AM 01/20/2024   11:18 AM  PHQ 2/9 Scores  PHQ - 2 Score 1 1 1   PHQ- 9 Score 2 2 4     Last fall risk screening    04/14/2024    1:44 PM  Fall Risk   Falls in the past year? 0  Number falls in  past yr: 0  Injury with Fall? 0  Risk for fall due to : No Fall Risks  Follow up Falls evaluation completed    Last Audit-C alcohol use screening    04/14/2024    9:52 AM  Alcohol Use Disorder Test (AUDIT)  1. How often do you have a drink containing alcohol? 0  3. How often do you have six or more drinks on one occasion? 0   A score of 3 or more in women, and 4 or  more in men indicates increased risk for alcohol abuse, EXCEPT if all of the points are from question 1   Results for orders placed or performed in visit on 04/14/24  Hemoglobin A1c  Result Value Ref Range   Hgb A1c MFr Bld 5.5 4.8 - 5.6 %   Est. average glucose Bld gHb Est-mCnc 111 mg/dL  Lipid panel  Result Value Ref Range   Cholesterol, Total 265 (H) 100 - 199 mg/dL   Triglycerides 804 (H) 0 - 149 mg/dL   HDL 36 (L) >60 mg/dL   VLDL Cholesterol Cal 37 5 - 40 mg/dL   LDL Chol Calc (NIH) 807 (H) 0 - 99 mg/dL   LDL CALC COMMENT: Comment    Chol/HDL Ratio 7.4 (H) 0.0 - 4.4 ratio  CMP14+EGFR  Result Value Ref Range   Glucose 91 70 - 99 mg/dL   BUN 9 6 - 20 mg/dL   Creatinine, Ser 9.07 0.57 - 1.00 mg/dL   eGFR 82 >40 fO/fpw/8.26   BUN/Creatinine Ratio 10 9 - 23   Sodium 139 134 - 144 mmol/L   Potassium 4.2 3.5 - 5.2 mmol/L   Chloride 104 96 - 106 mmol/L   CO2 21 20 - 29 mmol/L   Calcium 9.3 8.7 - 10.2 mg/dL   Total Protein 6.9 6.0 - 8.5 g/dL   Albumin 4.0 3.9 - 4.9 g/dL   Globulin, Total 2.9 1.5 - 4.5 g/dL   Bilirubin Total 0.5 0.0 - 1.2 mg/dL   Alkaline Phosphatase 74 41 - 116 IU/L   AST 16 0 - 40 IU/L   ALT 18 0 - 32 IU/L  CBC  Result Value Ref Range   WBC 7.1 3.4 - 10.8 x10E3/uL   RBC 4.79 3.77 - 5.28 x10E6/uL   Hemoglobin 14.2 11.1 - 15.9 g/dL   Hematocrit 56.6 65.9 - 46.6 %   MCV 90 79 - 97 fL   MCH 29.6 26.6 - 33.0 pg   MCHC 32.8 31.5 - 35.7 g/dL   RDW 87.4 88.2 - 84.5 %   Platelets 215 150 - 450 x10E3/uL    Assessment & Plan    Routine Health Maintenance and Physical Exam  Immunization  History  Administered Date(s) Administered   Tdap 04/20/2015    Health Maintenance  Topic Date Due   COVID-19 Vaccine (1) 04/26/2024 (Originally 07/22/1991)   Influenza Vaccine  09/29/2024 (Originally 01/31/2024)   Pneumococcal Vaccine (1 of 2 - PCV) 04/14/2025 (Originally 07/21/2005)   Hepatitis B Vaccines 19-59 Average Risk (1 of 3 - 19+ 3-dose series) 04/14/2025 (Originally 07/21/2005)   HPV VACCINES (1 - Risk 3-dose SCDM series) 04/14/2025 (Originally 07/21/2013)   Mammogram  12/03/2024   DTaP/Tdap/Td (2 - Td or Tdap) 04/19/2025   Hepatitis C Screening  Completed   HIV Screening  Completed   Meningococcal B Vaccine  Aged Out    Problem List Items Addressed This Visit     Class 1 obesity with serious comorbidity and body mass index (BMI) of 33.0 to 33.9 in adult   Hypertriglyceridemia   Relevant Orders   Lipid panel (Completed)   Well woman exam with routine gynecological exam   Other Visit Diagnoses       Annual physical exam    -  Primary   Relevant Orders   CMP14+EGFR (Completed)   CBC (Completed)     Screening for diabetes mellitus       Relevant Orders   Hemoglobin A1c (Completed)     Acute right lower quadrant  pain           Assessment and Plan Assessment & Plan Right lower quadrant abdominal tenderness Tenderness in the right lower quadrant with no prior history. Differential diagnosis includes potential liver inflammation or infection. - Order liver panel - Perform CBC and CMP to check for signs of infection or liver inflammation  Obesity, class 1 BMI indicates class 1 obesity. Difficulty losing weight post-hysterectomy despite regular physical activity and dietary changes. Current strategies not yielding desired results. - Aim for 200-240 minutes of moderate-intensity physical activity per week - Target 1400 calories per day - Reassess weight management in 3 months  Hyperlipidemia Previous labs show elevated LDL, triglycerides, and total cholesterol.  Discussed dietary factors influencing cholesterol levels. - Order fasting lipid panel - Discuss dietary modifications to improve cholesterol levels  Adult Wellness Visit Annual physical examination with normal blood pressure. Declined influenza, COVID, pneumococcal, hepatitis B, and HPV vaccines. Last mammogram on December 04, 2023. No Pap smear needed due to hysterectomy with cervix removal. - Perform comprehensive metabolic panel (CMP) - Perform complete blood count (CBC)       Return in about 3 months (around 07/15/2024) for weight/lifestyle f/u .       Rockie Agent, MD  [facility] [phone] (phone) [phone] (fax)  [facility]

## 2024-04-14 NOTE — Patient Instructions (Signed)
 To keep you healthy, please keep in mind the following health maintenance items that you are due for:   There are no preventive care reminders to display for this patient.   Best Wishes,   Dr. Lang

## 2024-04-15 DIAGNOSIS — Z0000 Encounter for general adult medical examination without abnormal findings: Secondary | ICD-10-CM | POA: Diagnosis not present

## 2024-04-15 DIAGNOSIS — E781 Pure hyperglyceridemia: Secondary | ICD-10-CM | POA: Diagnosis not present

## 2024-04-15 DIAGNOSIS — Z131 Encounter for screening for diabetes mellitus: Secondary | ICD-10-CM | POA: Diagnosis not present

## 2024-04-16 LAB — LIPID PANEL
Chol/HDL Ratio: 7.4 ratio — ABNORMAL HIGH (ref 0.0–4.4)
Cholesterol, Total: 265 mg/dL — ABNORMAL HIGH (ref 100–199)
HDL: 36 mg/dL — ABNORMAL LOW (ref >39)
LDL Chol Calc (NIH): 192 mg/dL — ABNORMAL HIGH (ref 0–99)
Triglycerides: 195 mg/dL — ABNORMAL HIGH (ref 0–149)
VLDL Cholesterol Cal: 37 mg/dL (ref 5–40)

## 2024-04-16 LAB — CMP14+EGFR
ALT: 18 IU/L (ref 0–32)
AST: 16 IU/L (ref 0–40)
Albumin: 4 g/dL (ref 3.9–4.9)
Alkaline Phosphatase: 74 IU/L (ref 41–116)
BUN/Creatinine Ratio: 10 (ref 9–23)
BUN: 9 mg/dL (ref 6–20)
Bilirubin Total: 0.5 mg/dL (ref 0.0–1.2)
CO2: 21 mmol/L (ref 20–29)
Calcium: 9.3 mg/dL (ref 8.7–10.2)
Chloride: 104 mmol/L (ref 96–106)
Creatinine, Ser: 0.92 mg/dL (ref 0.57–1.00)
Globulin, Total: 2.9 g/dL (ref 1.5–4.5)
Glucose: 91 mg/dL (ref 70–99)
Potassium: 4.2 mmol/L (ref 3.5–5.2)
Sodium: 139 mmol/L (ref 134–144)
Total Protein: 6.9 g/dL (ref 6.0–8.5)
eGFR: 82 mL/min/1.73 (ref >59)

## 2024-04-16 LAB — CBC
Hematocrit: 43.3 % (ref 34.0–46.6)
Hemoglobin: 14.2 g/dL (ref 11.1–15.9)
MCH: 29.6 pg (ref 26.6–33.0)
MCHC: 32.8 g/dL (ref 31.5–35.7)
MCV: 90 fL (ref 79–97)
Platelets: 215 x10E3/uL (ref 150–450)
RBC: 4.79 x10E6/uL (ref 3.77–5.28)
RDW: 12.5 % (ref 11.7–15.4)
WBC: 7.1 x10E3/uL (ref 3.4–10.8)

## 2024-04-16 LAB — HEMOGLOBIN A1C
Est. average glucose Bld gHb Est-mCnc: 111 mg/dL
Hgb A1c MFr Bld: 5.5 % (ref 4.8–5.6)

## 2024-04-22 ENCOUNTER — Ambulatory Visit: Payer: Self-pay | Admitting: Family Medicine

## 2024-05-07 ENCOUNTER — Telehealth: Payer: Self-pay

## 2024-05-07 NOTE — Telephone Encounter (Signed)
 Copied from CRM #[id number]. Topic: Referral - Request for Referral >> May 07, 2024  1:50 PM Deaijah H wrote: Did the patient discuss referral with their provider in the last year? Yes (If No - schedule appointment) (If Yes - send message)  Appointment offered? No  Type of order/referral and detailed reason for visit: Mammogram/Ultrasound ; Knots in left breast (new symptoms)   Preference of office, provider, location: [facility] [address] [place], [place] [address]  If referral order, have you been seen by this specialty before? Yes (If Yes, this issue or another issue? When? Where? Same issue , same location 04/25/2023 and so on  Can we respond through MyChart? Yes

## 2024-05-11 ENCOUNTER — Encounter: Payer: Self-pay | Admitting: Family Medicine

## 2024-05-11 ENCOUNTER — Ambulatory Visit: Admitting: Family Medicine

## 2024-05-11 VITALS — BP 109/80 | HR 81 | Ht 65.0 in | Wt 200.0 lb

## 2024-05-11 DIAGNOSIS — N6459 Other signs and symptoms in breast: Secondary | ICD-10-CM | POA: Diagnosis not present

## 2024-05-11 DIAGNOSIS — N644 Mastodynia: Secondary | ICD-10-CM

## 2024-05-11 NOTE — Progress Notes (Signed)
 ACUTE VISIT   Patient: Deborah Lambert   DOB: 04/14/1986   38 y.o. Female  MRN: [id number]   PCP: Sharma Coyer, MD  Chief Complaint  Patient presents with   Breast Mass    Right breast mass found x 1 week. Patient found this in the shower, some pain with pressure. Both breasts will occasionally itch and get sharp pains    Subjective    HPI HPI     Breast Mass    Additional comments: Right breast mass found x 1 week. Patient found this in the shower, some pain with pressure. Both breasts will occasionally itch and get sharp pains       Last edited by Cherry Chiquita HERO, CMA on 05/11/2024  2:01 PM.       Discussed the use of AI scribe software for clinical note transcription with the patient, who gave verbal consent to proceed.  History of Present Illness Deborah Lambert is a 38 year old female who presents with a right breast mass for one week.  Approximately one week ago, she discovered a mass in the outer quadrant of her right breast while showering. The mass is associated with tenderness and pain, which is sometimes sharp and at other times a dull ache, especially when pressure is applied. She has been managing the pain with Tylenol  and alternating heat and cold packs, which provide some relief. There is no correlation of the pain with her menstrual cycle.  She experiences occasional itching and sharp pains in both breasts. There is no nipple discharge, but she has noticed a musty smell at times.     Medications: Outpatient Medications Prior to Visit  Medication Sig   albuterol  (VENTOLIN  HFA) 108 (90 Base) MCG/ACT inhaler Inhale 2 puffs into the lungs every 6 (six) hours as needed for wheezing or shortness of breath. Inhale into the lungs every 6 (six) hours as needed for wheezing or shortness of breath.   clobetasol  cream (TEMOVATE ) 0.05 % Apply to aa's hands BID PRN. Avoid applying to face, groin, and axilla. Use as directed. Long-term use can cause  thinning of the skin.   ELIDEL  1 % cream Apply to aa's hands BID PRN.   ibuprofen  (ADVIL ) 600 MG tablet Take 1 tablet (600 mg total) by mouth every 6 (six) hours as needed.   naproxen  (NAPROSYN ) 500 MG tablet Take 1 tablet (500 mg total) by mouth 2 (two) times daily.   ondansetron  (ZOFRAN -ODT) 4 MG disintegrating tablet Take 1 tablet (4 mg total) by mouth every 8 (eight) hours as needed.   prazosin  (MINIPRESS ) 1 MG capsule Take 1 capsule (1 mg total) by mouth at bedtime.   SUMAtriptan (IMITREX) 50 MG tablet Take 1 tablet (50 mg total) by mouth every 2 (two) hours as needed for migraine. May repeat in 2 hours if headache persists or recurs.   tacrolimus  (PROTOPIC ) 0.1 % ointment Apply topically 2 (two) times daily. As needed   Dupilumab  (DUPIXENT ) 300 MG/2ML SOAJ Inject 300 mg into the skin every 14 (fourteen) days. Starting at day 15 for maintenance. (Patient not taking: Reported on 05/11/2024)   meloxicam  (MOBIC ) 7.5 MG tablet Take 1-2 tablets (7.5-15 mg total) by mouth daily. (Patient not taking: Reported on 05/11/2024)   [DISCONTINUED] gabapentin  (NEURONTIN ) 100 MG capsule Take 200 mg by mouth 2 (two) times daily. (Patient not taking: Reported on 04/14/2024)   No facility-administered medications prior to visit.        Objective  BP 109/80 (BP Location: Left Arm, Patient Position: Sitting, Cuff Size: Normal)   Pulse 81   Ht 5' 5 (1.651 m)   Wt 200 lb (90.7 kg)   LMP 08/12/2023 (Approximate)   SpO2 99%   BMI 33.28 kg/m    Physical Exam   Physical Exam BREAST: Right breast with a soft, mobile, irregularly shaped nodule between 3 and 4 o'clock, and another similar nodule with tenderness between 8 and 9 o'clock. Left breast exhibits tenderness and potential cystic areas from 4 to 8 o'clock. No prominent axillary lymph nodes or axillary tenderness. No skin changes of either breast.   No results found for any visits on 05/11/24.  Assessment & Plan     Assessment and  Plan Assessment & Plan Right breast mass with tenderness, acute  Right breast mass located between the three and four o'clock level, with tenderness and palpable, soft, mobile, irregularly shaped nodule. No nipple discharge or skin changes. Pain is sharp and intermittent, transitioning to a dull ache. Conservative measures with Tylenol  and heat/cold packs have been used. - Ordered diagnostic mammogram for both breasts - Ordered ultrasound for both breasts extending into axillary regions - Continue conservative measures with Tylenol  and heat/cold packs  Left breast tenderness and possible cysts, acute  Left breast tenderness with potential cystic areas ranging from the four to eight o'clock positions, primarily in the lower third of the breast. No skin changes or prominent axillary lymph nodes. Conservative measures have been used. - Ordered diagnostic mammogram for both breasts - Ordered ultrasound for both breasts extending into axillary regions - Continue conservative measures with Tylenol  and heat/cold packs      No follow-ups on file.        Rockie Agent, MD  [facility] [phone] (phone) [phone] (fax)  [facility]

## 2024-05-14 ENCOUNTER — Ambulatory Visit
Admission: RE | Admit: 2024-05-14 | Discharge: 2024-05-14 | Disposition: A | Discharge status: Home / Self Care | Source: Ambulatory Visit | Attending: Family Medicine | Admitting: Family Medicine

## 2024-05-14 DIAGNOSIS — N6459 Other signs and symptoms in breast: Secondary | ICD-10-CM

## 2024-05-14 DIAGNOSIS — N644 Mastodynia: Secondary | ICD-10-CM | POA: Insufficient documentation

## 2024-05-15 ENCOUNTER — Ambulatory Visit: Payer: Self-pay | Admitting: Family Medicine

## 2024-05-18 ENCOUNTER — Ambulatory Visit
Admission: EM | Admit: 2024-05-18 | Discharge: 2024-05-18 | Disposition: A | Discharge status: Home / Self Care | Attending: Physician Assistant | Admitting: Physician Assistant

## 2024-05-18 DIAGNOSIS — T23201A Burn of second degree of right hand, unspecified site, initial encounter: Secondary | ICD-10-CM | POA: Diagnosis not present

## 2024-05-18 MED ORDER — SILVER SULFADIAZINE 1 % EX CREA: 1.0000 | TOPICAL_CREAM | Freq: Two times a day (BID) | CUTANEOUS | 0 refills | Status: DC
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
## 2024-05-18 NOTE — Discharge Instructions (Addendum)
-   Clean area with soap and water like usual.  Apply the burn cream twice daily.  Use a nonstick pad and wrapped Coban around there for at least half a day.  You may remove this during the nighttime or a portion of the day so air can get to the wound. - If you notice increased swelling or redness around the burn site or pustular drainage please return for reevaluation as the area could be infected.

## 2024-05-23 ENCOUNTER — Telehealth: Admitting: Family Medicine

## 2024-05-23 DIAGNOSIS — R051 Acute cough: Secondary | ICD-10-CM | POA: Diagnosis not present

## 2024-05-23 MED ORDER — BENZONATATE 100 MG PO CAPS: 100.0000 mg | ORAL_CAPSULE | Freq: Three times a day (TID) | ORAL | 0 refills | Status: AC | PRN

## 2024-05-23 MED ORDER — PREDNISONE 10 MG (21) PO TBPK: ORAL_TABLET | ORAL | 0 refills | Status: DC

## 2024-05-23 NOTE — Progress Notes (Signed)
 We are sorry that you are not feeling well.  Here is how we plan to help!  Based on your presentation I believe you most likely have A cough due to a virus.  This is called viral bronchitis and is best treated by rest, plenty of fluids and control of the cough.  You may use Ibuprofen  or Tylenol  as directed to help your symptoms.     In addition you may use A prescription cough medication called Tessalon  Perles 100mg . You may take 1-2 capsules every 8 hours as needed for your cough.  Prednisone  10 mg daily for 6 days (see taper instructions below)  From your responses in the eVisit questionnaire you describe inflammation in the upper respiratory tract which is causing a significant cough.  This is commonly called Bronchitis and has four common causes:   Allergies Viral Infections Acid Reflux Bacterial Infection Allergies, viruses and acid reflux are treated by controlling symptoms or eliminating the cause. An example might be a cough caused by taking certain blood pressure medications. You stop the cough by changing the medication. Another example might be a cough caused by acid reflux. Controlling the reflux helps control the cough.  USE OF BRONCHODILATOR (RESCUE) INHALERS: There is a risk from using your bronchodilator too frequently.  The risk is that over-reliance on a medication which only relaxes the muscles surrounding the breathing tubes can reduce the effectiveness of medications prescribed to reduce swelling and congestion of the tubes themselves.  Although you feel brief relief from the bronchodilator inhaler, your asthma may actually be worsening with the tubes becoming more swollen and filled with mucus.  This can delay other crucial treatments, such as oral steroid medications. If you need to use a bronchodilator inhaler daily, several times per day, you should discuss this with your provider.  There are probably better treatments that could be used to keep your asthma under control.      HOME CARE Only take medications as instructed by your medical team. Complete the entire course of an antibiotic. Drink plenty of fluids and get plenty of rest. Avoid close contacts especially the very young and the elderly Cover your mouth if you cough or cough into your sleeve. Always remember to wash your hands A steam or ultrasonic humidifier can help congestion.   GET HELP RIGHT AWAY IF: You develop worsening fever. You become short of breath You cough up blood. Your symptoms persist after you have completed your treatment plan MAKE SURE YOU  Understand these instructions. Will watch your condition. Will get help right away if you are not doing well or get worse.  Your e-visit answers were reviewed by a board certified advanced clinical practitioner to complete your personal care plan.  Depending on the condition, your plan could have included both over the counter or prescription medications. If there is a problem please reply  once you have received a response from your provider. Your safety is important to us .  If you have drug allergies check your prescription carefully.    You can use MyChart to ask questions about today's visit, request a non-urgent call back, or ask for a work or school excuse for 24 hours related to this e-Visit. If it has been greater than 24 hours you will need to follow up with your provider, or enter a new e-Visit to address those concerns. You will get an e-mail in the next two days asking about your experience.  I hope that your e-visit has been valuable  and will speed your recovery. Thank you for using e-visits.   I have spent 5 minutes in review of e-visit questionnaire, review and updating patient chart, medical decision making and response to patient.   Roosvelt Mater, PA-C

## 2024-05-25 ENCOUNTER — Telehealth: Admitting: Physician Assistant

## 2024-05-25 DIAGNOSIS — J4541 Moderate persistent asthma with (acute) exacerbation: Secondary | ICD-10-CM

## 2024-05-25 NOTE — Progress Notes (Signed)
  Because you are not improving and have been prescribed a steroid and cough medication already, I feel your condition warrants further evaluation and I recommend that you be seen in a face-to-face visit.   NOTE: There will be NO CHARGE for this E-Visit   If you are having a true medical emergency, please call 911.     For an urgent face to face visit, [facility] has multiple urgent care centers for your convenience.  Click the link below for the full list of locations and hours, walk-in wait times, appointment scheduling options and driving directions:  Urgent Care - [place], [place], [place], [place], [place], [place]  [place]     Your MyChart E-visit questionnaire answers were reviewed by a board certified advanced clinical practitioner to complete your personal care plan based on your specific symptoms.    Thank you for using e-Visits.

## 2024-06-01 ENCOUNTER — Ambulatory Visit: Admitting: Oncology

## 2024-06-02 ENCOUNTER — Inpatient Hospital Stay: Admitting: Oncology

## 2024-06-30 ENCOUNTER — Encounter (INDEPENDENT_AMBULATORY_CARE_PROVIDER_SITE_OTHER): Payer: Self-pay

## 2024-06-30 NOTE — Telephone Encounter (Signed)
 Patient left voicemail requesting a referral to new skin doctor- [facility]

## 2024-07-02 ENCOUNTER — Encounter: Payer: Self-pay | Admitting: Family Medicine

## 2024-07-03 NOTE — Progress Notes (Signed)
 This note has been created using automated tools and reviewed for accuracy by [facility].  Chief Complaint  Patient presents with   Left Leg - Pain   Right Leg - Pain    Subjective  Deborah Lambert is a 39 y.o. female who presents for Pain of the Left Leg and Pain of the Right Leg HPI History of Present Illness Deborah Lambert is a 39 year old female with cavus foot and scoliosis who presents with bilateral lower leg and calf pain.  For one month, she has experienced bilateral lower leg and calf pain described as cramping and spasms, more pronounced on the left. Symptoms occur both during the day and at night, sometimes severe enough to nearly cause her to fall out of bed. She reports persistent calf swelling and that her calves lock up. Pain is exacerbated by walking, occasionally requiring her to stop, though she continues to ambulate frequently due to caregiving responsibilities. She reports nocturnal cramping.  She has previously tried arch supports without relief and maintains adequate hydration. She is not currently using Lyrica, pregabalin, oxycodone , or tramadol , having discontinued these after her hysterectomy. She continues to use Imitrex  for migraines and Zofran  as needed.  A lateral ankle x-ray in July demonstrated cavus foot but was otherwise unremarkable. She distinguishes her current symptoms from prior foot pain related to an earlier injury, describing them as distinct. Persistent pain remains in the previously injured foot.  She has scoliosis, with imaging in 2020 showing mild lumbar dextroscoliosis. She occasionally experiences pain radiating from the hip down the leg into the feet, described as squeezing tightness. Her last lumbar MRI was in 2007 and reportedly normal.  Review of Systems  Patient Active Problem List  Diagnosis   Anxiety   At high risk for breast cancer   CIN III (cervical intraepithelial neoplasia grade III) with severe dysplasia   COVID-19    Depression, recurrent ()   Dizziness   Atopic dermatitis   Family history of breast cancer   Family history of breast cancer gene mutation in first degree relative   Generalized edema   Genetic testing   History of trauma   Obesity (BMI 30-39.9), unspecified   Postconcussion syndrome   Acute stress reaction   Seasonal allergies   Supraspinatus syndrome of right shoulder   Moderate persistent asthma, uncomplicated (HHS-HCC)   Bipolar disorder, mixed (CMS/HHS-HCC)   Hypertriglyceridemia    Outpatient Medications Prior to Visit  Medication Sig Dispense Refill   acetaminophen  (TYLENOL ) 500 MG tablet Take 1,500 mg by mouth 2 (two) times daily as needed for Pain     albuterol  (PROVENTIL ) 2.5 mg /3 mL (0.083 %) nebulizer solution Inhale 2.5 mg into the lungs every 6 (six) hours as needed     albuterol  90 mcg/actuation inhaler Inhale into the lungs     ondansetron  (ZOFRAN ) 8 MG tablet Take 8 mg by mouth every 8 (eight) hours as needed for Nausea     SUMAtriptan  (IMITREX ) 50 MG tablet Take 50 mg by mouth once as needed for Migraine     oxyCODONE  (ROXICODONE ) 5 MG immediate release tablet Take 1 tablet (5 mg total) by mouth every 4 (four) hours as needed for Pain 40 tablet 0   traMADol  (ULTRAM ) 50 mg tablet Take 1 tablet (50 mg total) by mouth every 6 (six) hours as needed for Pain 40 tablet 1   pregabalin (LYRICA) 25 MG capsule Take 25mg  twice daily for one week, then increase to 50mg  twice daily and  continue. (Patient not taking: Reported on 07/01/2024) 120 capsule 1   No facility-administered medications prior to visit.      Objective  Vitals:   07/01/24 1046  BP: 122/70  Weight: 86.2 kg (190 lb)  Height: 166.4 cm (5' 5.5)  PainSc:   6  PainLoc: Leg   Body mass index is 31.14 kg/m.  Home Vitals:     Physical Exam Physical Exam GENERAL: Alert, cooperative, well developed, no acute distress. HEENT: Normocephalic, normal oropharynx, moist mucous  membranes. CHEST: Clear to auscultation bilaterally, no wheezes, rhonchi, or crackles. CARDIOVASCULAR: Normal heart rate and rhythm, S1 and S2 normal without murmurs. ABDOMEN: Soft, non-tender, non-distended, without organomegaly, normal bowel sounds. EXTREMITIES: No cyanosis or edema. Tenderness in calf muscles bilaterally. MUSCULOSKELETAL: Pain with passive ankle dorsiflexion and eversion/inversion. Ankle stiffness noted. Tight heel cord noted. No pain with hip internal/external rotation. Back flexibility to 70 degrees. Tight hamstrings noted. NEUROLOGICAL: Cranial nerves grossly intact, moves all extremities without gross motor or sensory deficit. Reflexes normal.  Results Labs Serum potassium (04/2024): Within normal limits Serum calcium (04/2024): Within normal limits  Radiology Ankle X-ray (12/2023): Normal. Pes cavus noted on lateral view. Lumbar spine X-ray (2020): Mild dextroscoliosis, increased. Main thoracic curvature. No acute changes. Normal alignment. Thoracic spine/chest X-ray: Very mild thoracic curvature, stable compared to prior studies. (Independently interpreted)     Assessment/Plan:   Assessment & Plan Hamstring and gastrocnemius muscle tightness of both lower extremities She has experienced one month of bilateral lower extremity pain, cramping, and spasms, more pronounced on the left. Examination shows hamstring and gastrocnemius muscle tightness with stiffness and pain on passive stretching. The cause is likely muscular tightness, exacerbated by biomechanics and activity level. Electrolyte disturbances and dehydration are unlikely due to normal lab results and adequate hydration. If symptoms persist despite conservative management, further evaluation for rare causes like tethered cord syndrome may be needed. Recommend daily stretching exercises for hamstrings and gastrocnemius muscles, with specific instruction on technique and frequency. Use a Pro Stretch device for  targeted calf stretching, with demonstration provided. Use supportive footwear, such as sneakers, during stretching instead of boots. Maintain adequate hydration. Follow up in one month if symptoms do not improve; if persistent, consider lumbar spine MRI to evaluate for tethered cord syndrome or other neurological causes.  Recording duration: 18 minutes Diagnoses and all orders for this visit:  Hamstring tightness of both lower extremities  Tightness of both gastrocnemius muscles    This visit was coded based on medical decision making (MDM).           Future Appointments     Date/Time Provider Department Center Visit Type   08/03/2024 2:00 PM Kathlynn Ozell Pac, MD [facility] ORTHO RETURN       There are no Patient Instructions on file for this visit.  An after visit summary was provided for the patient either in written format (printed) or through My [facility].  This note has been created using automated tools and reviewed for accuracy by [facility]

## 2024-07-06 ENCOUNTER — Ambulatory Visit: Admitting: Family Medicine

## 2024-07-06 ENCOUNTER — Encounter: Payer: Self-pay | Admitting: Family Medicine

## 2024-07-06 VITALS — BP 112/80 | HR 75 | Ht 65.5 in | Wt 195.0 lb

## 2024-07-06 DIAGNOSIS — G43109 Migraine with aura, not intractable, without status migrainosus: Secondary | ICD-10-CM

## 2024-07-06 MED ORDER — PROMETHAZINE HCL 25 MG PO TABS: 25.0000 mg | ORAL_TABLET | Freq: Three times a day (TID) | ORAL | 0 refills | Status: AC | PRN

## 2024-07-06 MED ORDER — PREDNISONE 10 MG PO TABS: 30.0000 mg | ORAL_TABLET | Freq: Every day | ORAL | 0 refills | Status: AC

## 2024-07-06 MED ORDER — METHYLPREDNISOLONE ACETATE 40 MG/ML IJ SUSP
40.0000 mg | Freq: Once | INTRAMUSCULAR | Status: AC
  Administered 2024-07-06: 40 mg via INTRAMUSCULAR

## 2024-07-06 MED ORDER — CYCLOBENZAPRINE HCL 5 MG PO TABS: 5.0000 mg | ORAL_TABLET | Freq: Every day | ORAL | 1 refills | Status: AC

## 2024-07-06 MED ORDER — NURTEC 75 MG PO TBDP: 75.0000 mg | ORAL_TABLET | ORAL | 0 refills | Status: AC

## 2024-07-06 NOTE — Patient Instructions (Signed)
 To keep you healthy, please keep in mind the following health maintenance items that you are due for:   Health Maintenance Due  Topic Date Due   COVID-19 Vaccine (1) Never done     Best Wishes,   Dr. Lang
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
## 2024-07-07 ENCOUNTER — Other Ambulatory Visit (HOSPITAL_COMMUNITY): Payer: Self-pay

## 2024-07-07 ENCOUNTER — Telehealth: Payer: Self-pay

## 2024-07-07 NOTE — Telephone Encounter (Signed)
 Pharmacy Patient Advocate Encounter   Received notification from Physician's Office that prior authorization for Nurtec 75MG  dispersible tablets is required/requested.   Insurance verification completed.   The patient is insured through [facility] 2017 [employer].   Per test claim: PA required; PA submitted to above mentioned insurance via Latent Key/confirmation #/EOC [id number] Status is pending

## 2024-07-08 ENCOUNTER — Ambulatory Visit (INDEPENDENT_AMBULATORY_CARE_PROVIDER_SITE_OTHER): Admitting: Student in an Organized Health Care Education/Training Program

## 2024-07-08 ENCOUNTER — Encounter: Payer: Self-pay | Admitting: Student in an Organized Health Care Education/Training Program

## 2024-07-08 VITALS — BP 102/70 | HR 93 | Temp 98.1°F | Ht 65.5 in | Wt 195.0 lb

## 2024-07-08 DIAGNOSIS — R0602 Shortness of breath: Secondary | ICD-10-CM

## 2024-07-08 DIAGNOSIS — J4540 Moderate persistent asthma, uncomplicated: Secondary | ICD-10-CM | POA: Diagnosis not present

## 2024-07-08 LAB — NITRIC OXIDE: Nitric Oxide: 13

## 2024-07-08 MED ORDER — FLUTICASONE-SALMETEROL 250-50 MCG/ACT IN AEPB: 1.0000 | INHALATION_SPRAY | Freq: Two times a day (BID) | RESPIRATORY_TRACT | 11 refills | Status: AC

## 2024-07-08 NOTE — Patient Instructions (Addendum)
" °  VISIT SUMMARY: During your visit, we discussed your increased use of the rescue inhaler due to asthma symptoms triggered by smoke, dust, and cat litter. We reviewed your asthma history, current symptoms, and previous treatments.  YOUR PLAN: -MODERATE PERSISTENT ASTHMA: Moderate persistent asthma means that your asthma symptoms occur daily and may affect your activities. Your increased use of the rescue inhaler is likely due to environmental triggers such as smoke, dust, and cat litter. We have ordered a pulmonary function test to assess your lung function and blood work for an allergy evaluation. You have been prescribed a Wixela inhaler to use one puff twice daily, and you should continue using albuterol  as needed. Please remember to wash your mouth after using Wixela to prevent thrush. We will follow up in three months after your pulmonary function test.  INSTRUCTIONS: Please schedule and complete the pulmonary function test and blood work for allergy evaluation as soon as possible. We will have a follow-up appointment in three months to review the results and adjust your treatment plan if necessary.  Today, I ordered blood work. You can get them draw at your preferred [facility] draw station. The nearest one to [facility] is at nearby [facility] ([address] [place], [place], [place] [address]).       Contains text generated by [employer].  "

## 2024-07-08 NOTE — Telephone Encounter (Signed)
 Per DPR, left detailed voicemail notifying patient of insurance's denial of nurtec, and advised of provider's message below.

## 2024-07-08 NOTE — Telephone Encounter (Signed)
 Please notify patient and have her schedule with neurology per referral during last visit to discuss these recommended alternatives

## 2024-07-08 NOTE — Telephone Encounter (Signed)
 Pharmacy Patient Advocate Encounter  Received notification from [facility] 2017 [employer] that Prior Authorization for Nurtec 75MG  dispersible tablets  has been DENIED.  Full denial letter will be uploaded to the media tab. See denial reason below.  Requires step therapy with two preferred injectable CGRP medications: Aimovig Autoinjector, Ajovy Autoinjector/Syringe, Emgality Pen/Syringe.   PA #/Case ID/Reference #: [id number]

## 2024-07-08 NOTE — Progress Notes (Signed)
 "  Synopsis: Referred in for asthma by Simmons-Robinson, Rockie, MD Assessment & Plan  #Moderate persistent asthma  Presents with symptoms classic of asthma, with cough and wheeze that improve with albuterol . Childhood onset of symptoms further supports this. FENO today was 13 but she did receive prednisone  recently so this result could be falsely low. Lung exam is similarly clear, but could be due to prednisone  use. She has increased rescue inhaler use with Asthma control test score of 15. No eosinophilia on historic CBC data, nor does she have any significant CT abnormalities (CT chest 01/2021 personally reviewed). She is allergic to Dupixent  (used for eczema).  For management, will start ICS/LABA with Wixela, and continue albuterol . Will obtain PFT's pre and post bronchodilator challenge and assess degree of obstruction and reversibility. Will also obtain a CBC with differential and allergen panel to assess for t-helper cell type 2 response. Patient will return to clinic after these tests for follow up and to assess response to treatment.  Lab Results  Component Value Date   NITRICOXIDE 13 07/08/2024   This result suggests low (<25) Type 2 (T2) airway inflammation indicating a low likelihood of active T2-driven airway inflammation; reduced probability of response to inhaled corticosteroids.   - Ordered pulmonary function test to assess lung function. - Ordered blood work for allergy evaluation. - Prescribed Wixela inhaler, one puff twice daily, with albuterol  as needed. - Instructed to wash mouth after Wixela use to prevent thrush. - Scheduled follow-up in three months post-pulmonary function test. - Pulmonary Function Test; Future - fluticasone -salmeterol (WIXELA INHUB) 250-50 MCG/ACT AEPB; Inhale 1 puff into the lungs in the morning and at bedtime.  Dispense: 60 each; Refill: 11 - CBC with Differential/Platelet - Allergen Panel (27) + IGE   Return in about 3 months (around  10/06/2024).  Belva November, MD [facility]  I spent 60 minutes caring for this patient today, including preparing to see the patient, obtaining a medical history , reviewing a separately obtained history, performing a medically appropriate examination and/or evaluation, counseling and educating the patient/family/caregiver, ordering medications, tests, or procedures, documenting clinical information in the electronic health record, and independently interpreting results (not separately reported/billed) and communicating results to the patient/family/caregiver  End of visit medications:  Meds ordered this encounter  Medications   fluticasone -salmeterol (WIXELA INHUB) 250-50 MCG/ACT AEPB    Sig: Inhale 1 puff into the lungs in the morning and at bedtime.    Dispense:  60 each    Refill:  11    Current Medications[1]   Subjective:   PATIENT ID: Deborah Lambert GENDER: female DOB: 01/16/1986, MRN: [id number]  Chief Complaint  Patient presents with   Cough    Using albuterol  2-3 times a week. Due to dry cough and allergy. Shortness of breath due to cough and occasional wheezing.     HPI  Discussed the use of AI scribe software for clinical note transcription with the patient, who gave verbal consent to proceed.  History of Present Illness  Deborah Lambert is a 39 year old female with asthma who presents with increased use of her rescue inhaler.  She has experienced increased use of her rescue inhaler due to asthma symptoms triggered by smoke, dust, and cat litter. Symptoms include coughing and gasping for air, particularly when exposed to these irritants. She uses albuterol  as a rescue inhaler, which provides relief after two to three puffs, and she uses the inhaler two to three times a week.  She recalls experiencing  wheezing and coughing as a child, particularly when playing outside, but did not have a formal diagnosis of asthma until adulthood. Cold weather  exacerbates her symptoms, causing difficulty breathing and chest tightness. She has had episodes of bronchitis or respiratory infections once or twice a year, with a notable episode last year requiring nebulizer treatment at urgent care.  Her asthma symptoms have been somewhat controlled over the past four weeks, with shortness of breath occurring once a day and symptoms waking her up once or twice a week.  She has a history of migraines, for which she takes Nurtec, and has been prescribed prednisone  in the past (03/2024 & 05/2024) for respiratory issues. She is currently receiving a course of prednisone  for her migraines. She reports that her primary doctor told her she has a small hole on top of her heart, but no further details were provided. Patient was previously on Dupilumab  for eczema that she stopped secondary to side effect.  There is no family history of asthma. Her social history includes exposure to smoke and dust due to environmental factors and cleaning a cat litter box. She has lived in [place]  all her life.   Ancillary information including prior medications, full medical/surgical/family/social histories, and PFTs (when available) are listed below and have been reviewed.   Asthma Control Test ACT Total Score  07/08/2024  9:31 AM 15    Review of Systems  Constitutional:  Positive for malaise/fatigue. Negative for chills, fever and weight loss.  Respiratory:  Positive for cough, sputum production, shortness of breath and wheezing. Negative for hemoptysis.   Cardiovascular:  Negative for chest pain.     Objective:   Vitals:   07/08/24 0900  BP: 102/70  Pulse: 93  Temp: 98.1 F (36.7 C)  TempSrc: Temporal  SpO2: 96%  Weight: 195 lb (88.5 kg)  Height: 5' 5.5 (1.664 m)   96% on RA  BMI Readings from Last 3 Encounters:  07/08/24 31.96 kg/m  07/06/24 31.96 kg/m  05/18/24 32.45 kg/m   Wt Readings from Last 3 Encounters:  07/08/24 195 lb (88.5 kg)  07/06/24  195 lb (88.5 kg)  05/18/24 195 lb (88.5 kg)    Physical Exam Vitals reviewed. Exam conducted with a chaperone present (CMA Evalene Louder present for entire visit per patient's request).  Constitutional:      Appearance: Normal appearance.  Cardiovascular:     Rate and Rhythm: Normal rate and regular rhythm.     Pulses: Normal pulses.     Heart sounds: Normal heart sounds.  Pulmonary:     Effort: Pulmonary effort is normal.     Breath sounds: Normal breath sounds. No wheezing or rales.  Neurological:     General: No focal deficit present.     Mental Status: She is alert and oriented to person, place, and time. Mental status is at baseline.       Ancillary Information    Past Medical History:  Diagnosis Date   Abnormal uterine bleeding    Allergy    Seasonal   Anemia    Anxiety    Arthritis    Asthma    Bell's palsy    Cancer (HCC)    CIN III (cervical intraepithelial neoplasia grade III) with severe dysplasia 09/02/2023   Cluster headaches    Concussion 2024   due to trauma   Depression 2004   Brother murder   Dyspnea    Focal seizures (HCC)    GERD (gastroesophageal reflux disease)  Heart murmur    Pneumonia    Pre-diabetes    PTSD (post-traumatic stress disorder)    Seizures (HCC)      Family History  Problem Relation Age of Onset   Stroke Mother    Breast cancer Mother        dx 30s, no genetic testing   Diabetes Father    Early death Brother    Throat cancer Paternal Uncle    Breast cancer Maternal Grandmother        dx under 50   Melanoma Maternal Grandfather        metastatic, d. 31s   Breast cancer Paternal Grandmother        dx 38s   Lung cancer Paternal Grandfather      Past Surgical History:  Procedure Laterality Date   ABDOMINAL HYSTERECTOMY  08-2023   FRACTURE SURGERY     LAPAROSCOPIC VAGINAL HYSTERECTOMY WITH SALPINGECTOMY N/A 09/02/2023   Procedure: LAPAROSCOPIC ASSISTED VAGINAL HYSTERECTOMY WITH BILATERAL SALPINGECTOMY;   Surgeon: Connell Davies, MD;  Location: [facility] ORS;  Service: Gynecology;  Laterality: N/A;   right arm surgery     TUBAL LIGATION      Social History   Socioeconomic History   Marital status: Married    Spouse name: Tkach,JAMES (Spouse)   Number of children: Not on file   Years of education: Not on file   Highest education level: 11th grade  Occupational History   Not on file  Tobacco Use   Smoking status: Never   Smokeless tobacco: Never  Vaping Use   Vaping status: Never Used  Substance and Sexual Activity   Alcohol use: Never   Drug use: Never   Sexual activity: Not Currently    Birth control/protection: Surgical  Other Topics Concern   Not on file  Social History Narrative   ** Merged History Encounter **       Pt have 2 kids.   Social Drivers of Health   Tobacco Use: Low Risk (07/08/2024)   Patient History    Smoking Tobacco Use: Never    Smokeless Tobacco Use: Never    Passive Exposure: Not on file  Financial Resource Strain: Low Risk  (07/01/2024)   Received from [facility]   Overall Financial Resource Strain (CARDIA)    Difficulty of Paying Living Expenses: Not very hard  Food Insecurity: Food Insecurity Present (07/01/2024)   Received from [facility]   Epic    Within the past 12 months, you worried that your food would run out before you got the money to buy more.: Sometimes true    Within the past 12 months, the food you bought just didn't last and you didn't have money to get more.: Never true  Transportation Needs: No Transportation Needs (07/01/2024)   Received from [facility] - Transportation    In the past 12 months, has lack of transportation kept you from medical appointments or from getting medications?: No    Lack of Transportation (Non-Medical): No  Physical Activity: Sufficiently Active (04/14/2024)   Exercise Vital Sign    Days of Exercise per Week: 7 days    Minutes of  Exercise per Session: 40 min  Stress: Stress Concern Present (04/14/2024)   [employer] - Occupational Stress Questionnaire    Feeling of Stress: To some extent  Social Connections: Moderately Isolated (04/14/2024)   Social Connection and Isolation Panel    Frequency of Communication with  Friends and Family: Twice a week    Frequency of Social Gatherings with Friends and Family: Once a week    Attends Religious Services: Never    Database Administrator or Organizations: No    Attends Engineer, Structural: Not on file    Marital Status: Married  Intimate Partner Violence: Not At Risk (10/07/2023)   Humiliation, Afraid, Rape, and Kick questionnaire    Fear of Current or Ex-Partner: No    Emotionally Abused: No    Physically Abused: No    Sexually Abused: No  Depression (PHQ2-9): Low Risk (05/11/2024)   Depression (PHQ2-9)    PHQ-2 Score: 2  Alcohol Screen: Low Risk (08/08/2023)   Alcohol Screen    Last Alcohol Screening Score (AUDIT): 0  Housing: Low Risk  (07/01/2024)   Received from [facility]   Epic    In the last 12 months, was there a time when you were not able to pay the mortgage or rent on time?: No    In the past 12 months, how many times have you moved where you were living?: 0    At any time in the past 12 months, were you homeless or living in a shelter (including now)?: No  Utilities: Not At Risk (07/01/2024)   Received from [facility]   Epic    In the past 12 months has the electric, gas, oil, or water company threatened to shut off services in your home?: No  Health Literacy: Adequate Health Literacy (08/08/2023)   B1300 Health Literacy    Frequency of need for help with medical instructions: Never     Allergies[2]   CBC    Component Value Date/Time   WBC 7.1 04/15/2024 0839   WBC 6.3 08/27/2023 1105   RBC 4.79 04/15/2024 0839   RBC 4.70 08/27/2023 1105   HGB 14.2 04/15/2024 0839   HCT  43.3 04/15/2024 0839   PLT 215 04/15/2024 0839   MCV 90 04/15/2024 0839   MCV 91 11/01/2013 0540   MCH 29.6 04/15/2024 0839   MCH 29.1 08/27/2023 1105   MCHC 32.8 04/15/2024 0839   MCHC 33.5 08/27/2023 1105   RDW 12.5 04/15/2024 0839   RDW 13.0 11/01/2013 0540   LYMPHSABS 2.0 07/04/2023 1534   LYMPHSABS 2.4 10/20/2013 1254   MONOABS 0.6 01/26/2018 1150   MONOABS 0.6 10/20/2013 1254   EOSABS 0.1 07/04/2023 1534   EOSABS 0.0 10/20/2013 1254   BASOSABS 0.1 07/04/2023 1534   BASOSABS 0.1 10/20/2013 1254    Pulmonary Functions Testing Results:     No data to display          Outpatient Medications Prior to Visit  Medication Sig Dispense Refill   albuterol  (VENTOLIN  HFA) 108 (90 Base) MCG/ACT inhaler Inhale 2 puffs into the lungs every 6 (six) hours as needed for wheezing or shortness of breath. Inhale into the lungs every 6 (six) hours as needed for wheezing or shortness of breath. 18 g 0   clobetasol  cream (TEMOVATE ) 0.05 % Apply to aa's hands BID PRN. Avoid applying to face, groin, and axilla. Use as directed. Long-term use can cause thinning of the skin. 60 g 5   cyclobenzaprine  (FLEXERIL ) 5 MG tablet Take 1 tablet (5 mg total) by mouth at bedtime. 30 tablet 1   ELIDEL  1 % cream Apply to aa's hands BID PRN. 60 g 0   ibuprofen  (ADVIL ) 600 MG tablet Take 1 tablet (600 mg total) by mouth  every 6 (six) hours as needed. 60 tablet 1   meloxicam  (MOBIC ) 7.5 MG tablet Take 1-2 tablets (7.5-15 mg total) by mouth daily. 60 tablet 0   naproxen  (NAPROSYN ) 500 MG tablet Take 1 tablet (500 mg total) by mouth 2 (two) times daily. 30 tablet 0   ondansetron  (ZOFRAN -ODT) 4 MG disintegrating tablet Take 1 tablet (4 mg total) by mouth every 8 (eight) hours as needed. 20 tablet 0   prazosin  (MINIPRESS ) 1 MG capsule Take 1 capsule (1 mg total) by mouth at bedtime. 90 capsule 1   predniSONE  (DELTASONE ) 10 MG tablet Take 3 tablets (30 mg total) by mouth daily with breakfast for 4 days. 12 tablet 0    promethazine  (PHENERGAN ) 25 MG tablet Take 1 tablet (25 mg total) by mouth every 8 (eight) hours as needed for nausea or vomiting. 20 tablet 0   Rimegepant Sulfate (NURTEC) 75 MG TBDP Take 1 tablet (75 mg total) by mouth every other day. 30 tablet 0   SUMAtriptan  (IMITREX ) 50 MG tablet Take 1 tablet (50 mg total) by mouth every 2 (two) hours as needed for migraine. May repeat in 2 hours if headache persists or recurs. 12 tablet 0   silver  sulfADIAZINE  (SILVADENE ) 1 % cream Apply 1 Application topically 2 (two) times daily. (Patient not taking: Reported on 07/08/2024) 25 g 0   tacrolimus  (PROTOPIC ) 0.1 % ointment Apply topically 2 (two) times daily. As needed (Patient not taking: Reported on 07/08/2024) 100 g 2   No facility-administered medications prior to visit.      [1]  Current Outpatient Medications:    albuterol  (VENTOLIN  HFA) 108 (90 Base) MCG/ACT inhaler, Inhale 2 puffs into the lungs every 6 (six) hours as needed for wheezing or shortness of breath. Inhale into the lungs every 6 (six) hours as needed for wheezing or shortness of breath., Disp: 18 g, Rfl: 0   clobetasol  cream (TEMOVATE ) 0.05 %, Apply to aa's hands BID PRN. Avoid applying to face, groin, and axilla. Use as directed. Long-term use can cause thinning of the skin., Disp: 60 g, Rfl: 5   cyclobenzaprine  (FLEXERIL ) 5 MG tablet, Take 1 tablet (5 mg total) by mouth at bedtime., Disp: 30 tablet, Rfl: 1   ELIDEL  1 % cream, Apply to aa's hands BID PRN., Disp: 60 g, Rfl: 0   fluticasone -salmeterol (WIXELA INHUB) 250-50 MCG/ACT AEPB, Inhale 1 puff into the lungs in the morning and at bedtime., Disp: 60 each, Rfl: 11   ibuprofen  (ADVIL ) 600 MG tablet, Take 1 tablet (600 mg total) by mouth every 6 (six) hours as needed., Disp: 60 tablet, Rfl: 1   meloxicam  (MOBIC ) 7.5 MG tablet, Take 1-2 tablets (7.5-15 mg total) by mouth daily., Disp: 60 tablet, Rfl: 0   naproxen  (NAPROSYN ) 500 MG tablet, Take 1 tablet (500 mg total) by mouth 2 (two) times  daily., Disp: 30 tablet, Rfl: 0   ondansetron  (ZOFRAN -ODT) 4 MG disintegrating tablet, Take 1 tablet (4 mg total) by mouth every 8 (eight) hours as needed., Disp: 20 tablet, Rfl: 0   prazosin  (MINIPRESS ) 1 MG capsule, Take 1 capsule (1 mg total) by mouth at bedtime., Disp: 90 capsule, Rfl: 1   predniSONE  (DELTASONE ) 10 MG tablet, Take 3 tablets (30 mg total) by mouth daily with breakfast for 4 days., Disp: 12 tablet, Rfl: 0   promethazine  (PHENERGAN ) 25 MG tablet, Take 1 tablet (25 mg total) by mouth every 8 (eight) hours as needed for nausea or vomiting., Disp: 20 tablet, Rfl: 0  Rimegepant Sulfate (NURTEC) 75 MG TBDP, Take 1 tablet (75 mg total) by mouth every other day., Disp: 30 tablet, Rfl: 0   SUMAtriptan  (IMITREX ) 50 MG tablet, Take 1 tablet (50 mg total) by mouth every 2 (two) hours as needed for migraine. May repeat in 2 hours if headache persists or recurs., Disp: 12 tablet, Rfl: 0   silver  sulfADIAZINE  (SILVADENE ) 1 % cream, Apply 1 Application topically 2 (two) times daily. (Patient not taking: Reported on 07/08/2024), Disp: 25 g, Rfl: 0   tacrolimus  (PROTOPIC ) 0.1 % ointment, Apply topically 2 (two) times daily. As needed (Patient not taking: Reported on 07/08/2024), Disp: 100 g, Rfl: 2 [2]  Allergies Allergen Reactions   Aspirin Shortness Of Breath and Rash   Codeine Shortness Of Breath and Rash   Gold Sodium Thiomalate    Almond Oil Rash   Coconut (Cocos Nucifera) Rash   "

## 2024-07-12 LAB — CBC WITH DIFFERENTIAL/PLATELET
Basophils Absolute: 0 x10E3/uL (ref 0.0–0.2)
Basos: 1 %
EOS (ABSOLUTE): 0.2 x10E3/uL (ref 0.0–0.4)
Eos: 2 %
Hematocrit: 44.1 % (ref 34.0–46.6)
Hemoglobin: 14.5 g/dL (ref 11.1–15.9)
Immature Grans (Abs): 0 x10E3/uL (ref 0.0–0.1)
Immature Granulocytes: 0 %
Lymphocytes Absolute: 2.1 x10E3/uL (ref 0.7–3.1)
Lymphs: 24 %
MCH: 29.8 pg (ref 26.6–33.0)
MCHC: 32.9 g/dL (ref 31.5–35.7)
MCV: 91 fL (ref 79–97)
Monocytes Absolute: 0.5 x10E3/uL (ref 0.1–0.9)
Monocytes: 6 %
Neutrophils Absolute: 5.8 x10E3/uL (ref 1.4–7.0)
Neutrophils: 67 %
Platelets: 227 x10E3/uL (ref 150–450)
RBC: 4.86 x10E6/uL (ref 3.77–5.28)
RDW: 12.6 % (ref 11.7–15.4)
WBC: 8.7 x10E3/uL (ref 3.4–10.8)

## 2024-07-12 LAB — ALLERGEN PANEL (27) + IGE
Alternaria Alternata IgE: <0.1 kU/L
Aspergillus Fumigatus IgE: <0.1 kU/L
Bahia Grass IgE: <0.1 kU/L
Bermuda Grass IgE: <0.1 kU/L
Cat Dander IgE: <0.1 kU/L
Cedar, Mountain IgE: <0.1 kU/L
Cladosporium Herbarum IgE: <0.1 kU/L
Cocklebur IgE: <0.1 kU/L
Cockroach, American IgE: <0.1 kU/L
Common Silver Birch IgE: <0.1 kU/L
D Farinae IgE: <0.1 kU/L
D Pteronyssinus IgE: <0.1 kU/L
Dog Dander IgE: <0.1 kU/L
Elm, American IgE: <0.1 kU/L
Hickory, White IgE: <0.1 kU/L
IgE (Immunoglobulin E), Serum: 98 [IU]/mL (ref 6–495)
Johnson Grass IgE: <0.1 kU/L
Kentucky Bluegrass IgE: <0.1 kU/L
Maple/Box Elder IgE: <0.1 kU/L
Mucor Racemosus IgE: <0.1 kU/L
Oak, White IgE: <0.1 kU/L
Penicillium Chrysogen IgE: <0.1 kU/L
Pigweed, Rough IgE: <0.1 kU/L
Plantain, English IgE: <0.1 kU/L
Ragweed, Short IgE: <0.1 kU/L
Setomelanomma Rostrat: <0.1 kU/L
Timothy Grass IgE: <0.1 kU/L
White Mulberry IgE: <0.1 kU/L

## 2024-07-16 ENCOUNTER — Ambulatory Visit: Admitting: Family Medicine

## 2024-07-20 ENCOUNTER — Ambulatory Visit

## 2024-08-07 ENCOUNTER — Ambulatory Visit: Admitting: Family Medicine

## 2024-08-07 ENCOUNTER — Encounter: Payer: Self-pay | Admitting: Family Medicine

## 2024-08-07 VITALS — BP 119/85 | HR 78 | Ht 65.5 in | Wt 212.0 lb

## 2024-08-07 DIAGNOSIS — R569 Unspecified convulsions: Secondary | ICD-10-CM | POA: Insufficient documentation

## 2024-08-07 DIAGNOSIS — L309 Dermatitis, unspecified: Secondary | ICD-10-CM

## 2024-08-07 MED ORDER — HALOBETASOL PROPIONATE 0.05 % EX OINT: TOPICAL_OINTMENT | Freq: Two times a day (BID) | CUTANEOUS | 0 refills | Status: AC

## 2024-08-07 NOTE — Progress Notes (Unsigned)
 "  Established Patient Office Visit  Patient ID: Deborah Lambert, female    DOB: 03/26/1986  Age: 39 y.o. MRN: [id number] PCP: Sharma Coyer, MD  Chief Complaint  Patient presents with   Eczema    Patient is present c/o eczema, requesting new referral to new dermatologist.    Seizures    Patient's husband is concerned she may be having episodes of seizures again, when this happens she will zone out and smell a burning flesh type of smell, also will experience confusion     Subjective:     HPI  Discussed the use of AI scribe software for clinical note transcription with the patient, who gave verbal consent to proceed.  History of Present Illness    {History (Optional):23778}  ROS    Objective:     BP 119/85 (BP Location: Left Arm, Patient Position: Sitting, Cuff Size: Normal)   Pulse 78   Ht 5' 5.5 (1.664 m)   Wt 212 lb (96.2 kg)   LMP 08/12/2023   SpO2 99%   BMI 34.74 kg/m  {Vitals History (Optional):23777}  Physical Exam Vitals reviewed.  Constitutional:      General: She is not in acute distress.    Appearance: Normal appearance. She is not ill-appearing.  Cardiovascular:     Rate and Rhythm: Normal rate and regular rhythm.  Pulmonary:     Effort: Pulmonary effort is normal. No respiratory distress.     Breath sounds: No wheezing, rhonchi or rales.  Neurological:     Mental Status: She is alert and oriented to person, place, and time.  Psychiatric:        Mood and Affect: Mood normal.        Behavior: Behavior normal.     {PhysExam Abridge (Optional):210964309} No results found for any visits on 08/07/24.  {Labs (Optional):23779}  The ASCVD Risk score (Arnett DK, et al., 2019) failed to calculate for the following reasons:   The 2019 ASCVD risk score is only valid for ages 28 to 40   According to ACC/AHA guidelines, patients with an LDL-C level greater than 190 mg/dL (5.08 mmol/L) should be considered for high-intensity statin  therapy. This patient's most recent LDL-C level is 192 mg/dL.  Outpatient Encounter Medications as of 08/07/2024  Medication Sig   albuterol  (VENTOLIN  HFA) 108 (90 Base) MCG/ACT inhaler Inhale 2 puffs into the lungs every 6 (six) hours as needed for wheezing or shortness of breath. Inhale into the lungs every 6 (six) hours as needed for wheezing or shortness of breath.   clobetasol  cream (TEMOVATE ) 0.05 % Apply to aa's hands BID PRN. Avoid applying to face, groin, and axilla. Use as directed. Long-term use can cause thinning of the skin.   cyclobenzaprine  (FLEXERIL ) 5 MG tablet Take 1 tablet (5 mg total) by mouth at bedtime.   fluticasone -salmeterol (WIXELA INHUB) 250-50 MCG/ACT AEPB Inhale 1 puff into the lungs in the morning and at bedtime.   ibuprofen  (ADVIL ) 600 MG tablet Take 1 tablet (600 mg total) by mouth every 6 (six) hours as needed.   meloxicam  (MOBIC ) 7.5 MG tablet Take 1-2 tablets (7.5-15 mg total) by mouth daily.   naproxen  (NAPROSYN ) 500 MG tablet Take 1 tablet (500 mg total) by mouth 2 (two) times daily.   ondansetron  (ZOFRAN -ODT) 4 MG disintegrating tablet Take 1 tablet (4 mg total) by mouth every 8 (eight) hours as needed.   prazosin  (MINIPRESS ) 1 MG capsule Take 1 capsule (1 mg total) by mouth at bedtime.  promethazine  (PHENERGAN ) 25 MG tablet Take 1 tablet (25 mg total) by mouth every 8 (eight) hours as needed for nausea or vomiting.   Rimegepant Sulfate (NURTEC) 75 MG TBDP Take 1 tablet (75 mg total) by mouth every other day.   SUMAtriptan  (IMITREX ) 50 MG tablet Take 1 tablet (50 mg total) by mouth every 2 (two) hours as needed for migraine. May repeat in 2 hours if headache persists or recurs.   [DISCONTINUED] ELIDEL  1 % cream Apply to aa's hands BID PRN.   [DISCONTINUED] silver  sulfADIAZINE  (SILVADENE ) 1 % cream Apply 1 Application topically 2 (two) times daily. (Patient not taking: Reported on 08/07/2024)   [DISCONTINUED] tacrolimus  (PROTOPIC ) 0.1 % ointment Apply topically 2  (two) times daily. As needed (Patient not taking: Reported on 08/07/2024)   No facility-administered encounter medications on file as of 08/07/2024.       Assessment & Plan:   Problem List Items Addressed This Visit     Eczema of both hands - Primary   Relevant Orders   Ambulatory referral to Dermatology   Other Visit Diagnoses       Witnessed seizure-like activity (HCC)           Assessment and Plan Assessment & Plan     No follow-ups on file.    Rockie Agent, MD [facility]  "

## 2024-08-07 NOTE — Patient Instructions (Signed)
 To keep you healthy, please keep in mind the following health maintenance items that you are due for:   Health Maintenance Due  Topic Date Due   COVID-19 Vaccine (1) Never done     Best Wishes,   Dr. Lang

## 2024-08-18 ENCOUNTER — Ambulatory Visit: Admitting: Family Medicine

## 2024-08-20 ENCOUNTER — Ambulatory Visit: Admitting: Certified Nurse Midwife

## 2024-11-02 ENCOUNTER — Ambulatory Visit: Admitting: Student in an Organized Health Care Education/Training Program

## 2024-11-02 ENCOUNTER — Encounter

## 2024-11-03 ENCOUNTER — Encounter

## 2024-11-03 ENCOUNTER — Ambulatory Visit: Admitting: Student in an Organized Health Care Education/Training Program

## 2024-11-12 ENCOUNTER — Ambulatory Visit: Admitting: Family Medicine

## 2025-02-18 ENCOUNTER — Ambulatory Visit
